# Patient Record
Sex: Female | Born: 1970 | Race: Black or African American | Hispanic: No | Marital: Single | Smoking: Current every day smoker
Health system: Southern US, Community
[De-identification: ages and names within clinical notes are randomized; demographics above are authoritative.]

## PROBLEM LIST (undated history)

## (undated) DIAGNOSIS — E119 Type 2 diabetes mellitus without complications: Secondary | ICD-10-CM

## (undated) DIAGNOSIS — I10 Essential (primary) hypertension: Secondary | ICD-10-CM

## (undated) HISTORY — PX: OTHER SURGICAL HISTORY: SHX169

## (undated) HISTORY — PX: BREAST LUMPECTOMY: SHX2

---

## 1997-11-01 ENCOUNTER — Encounter: Admission: RE | Admit: 1997-11-01 | Discharge: 1998-01-30 | Payer: Self-pay | Admitting: Obstetrics and Gynecology

## 2008-04-14 ENCOUNTER — Other Ambulatory Visit: Payer: Self-pay

## 2008-04-14 ENCOUNTER — Emergency Department: Payer: Self-pay | Admitting: Unknown Physician Specialty

## 2009-12-15 ENCOUNTER — Emergency Department: Payer: Self-pay | Admitting: Emergency Medicine

## 2019-01-21 ENCOUNTER — Telehealth: Payer: Self-pay | Admitting: Obstetrics & Gynecology

## 2019-01-21 NOTE — Telephone Encounter (Signed)
Spoke with pt. Pt has the Mirena IUD. Pt's family dr wants her to start Progesterone 200 mg. Pt wants to make sure this is ok since Mirena has progesterone also. Please advise. Thanks!! [place]

## 2019-01-21 NOTE — Telephone Encounter (Signed)
Please call pt she has the IUD and needs to talk about some meds that her primary put her on to see if it is okay, Please call pt and advise

## 2019-01-22 NOTE — Telephone Encounter (Signed)
I don't think we have ever seen this patient Am I mistaken on that?

## 2019-01-22 NOTE — Telephone Encounter (Signed)
Info on wrong pt. Encounter closed. [place]

## 2019-03-22 ENCOUNTER — Emergency Department
Admission: EM | Admit: 2019-03-22 | Discharge: 2019-03-22 | Disposition: A | Discharge status: Home / Self Care | Payer: BC Managed Care – PPO | Attending: Emergency Medicine | Admitting: Emergency Medicine

## 2019-03-22 ENCOUNTER — Other Ambulatory Visit: Payer: Self-pay

## 2019-03-22 ENCOUNTER — Encounter: Payer: Self-pay | Admitting: Emergency Medicine

## 2019-03-22 DIAGNOSIS — N390 Urinary tract infection, site not specified: Secondary | ICD-10-CM | POA: Diagnosis not present

## 2019-03-22 DIAGNOSIS — E1165 Type 2 diabetes mellitus with hyperglycemia: Secondary | ICD-10-CM | POA: Insufficient documentation

## 2019-03-22 DIAGNOSIS — F17210 Nicotine dependence, cigarettes, uncomplicated: Secondary | ICD-10-CM | POA: Diagnosis not present

## 2019-03-22 DIAGNOSIS — R739 Hyperglycemia, unspecified: Secondary | ICD-10-CM

## 2019-03-22 DIAGNOSIS — I10 Essential (primary) hypertension: Secondary | ICD-10-CM | POA: Diagnosis not present

## 2019-03-22 HISTORY — DX: Type 2 diabetes mellitus without complications: E11.9

## 2019-03-22 HISTORY — DX: Essential (primary) hypertension: I10

## 2019-03-22 LAB — CBC WITH DIFFERENTIAL/PLATELET
Abs Immature Granulocytes: 0.02 10*3/uL (ref 0.00–0.07)
Basophils Absolute: 0 10*3/uL (ref 0.0–0.1)
Basophils Relative: 0 %
Eosinophils Absolute: 0.1 10*3/uL (ref 0.0–0.5)
Eosinophils Relative: 1 %
HCT: 46 % (ref 36.0–46.0)
Hemoglobin: 14.9 g/dL (ref 12.0–15.0)
Immature Granulocytes: 0 %
Lymphocytes Relative: 34 %
Lymphs Abs: 2.7 10*3/uL (ref 0.7–4.0)
MCH: 25.6 pg — ABNORMAL LOW (ref 26.0–34.0)
MCHC: 32.4 g/dL (ref 30.0–36.0)
MCV: 79.2 fL — ABNORMAL LOW (ref 80.0–100.0)
Monocytes Absolute: 0.5 10*3/uL (ref 0.1–1.0)
Monocytes Relative: 7 %
Neutro Abs: 4.5 10*3/uL (ref 1.7–7.7)
Neutrophils Relative %: 58 %
Platelets: 235 10*3/uL (ref 150–400)
RBC: 5.81 MIL/uL — ABNORMAL HIGH (ref 3.87–5.11)
RDW: 13.2 % (ref 11.5–15.5)
WBC: 7.9 10*3/uL (ref 4.0–10.5)
nRBC: 0 % (ref 0.0–0.2)

## 2019-03-22 LAB — COMPREHENSIVE METABOLIC PANEL
ALT: 23 U/L (ref 0–44)
AST: 17 U/L (ref 15–41)
Albumin: 4.1 g/dL (ref 3.5–5.0)
Alkaline Phosphatase: 120 U/L (ref 38–126)
Anion gap: 15 (ref 5–15)
BUN: 19 mg/dL (ref 6–20)
CO2: 22 mmol/L (ref 22–32)
Calcium: 9.8 mg/dL (ref 8.9–10.3)
Chloride: 93 mmol/L — ABNORMAL LOW (ref 98–111)
Creatinine, Ser: 0.8 mg/dL (ref 0.44–1.00)
GFR calc Af Amer: >60 mL/min (ref >60)
GFR calc non Af Amer: >60 mL/min (ref >60)
Glucose, Bld: 358 mg/dL — ABNORMAL HIGH (ref 70–99)
Potassium: 4.1 mmol/L (ref 3.5–5.1)
Sodium: 130 mmol/L — ABNORMAL LOW (ref 135–145)
Total Bilirubin: 0.9 mg/dL (ref 0.3–1.2)
Total Protein: 8.5 g/dL — ABNORMAL HIGH (ref 6.5–8.1)

## 2019-03-22 LAB — GLUCOSE, CAPILLARY
Glucose-Capillary: 375 mg/dL — ABNORMAL HIGH (ref 70–99)
Glucose-Capillary: 326 mg/dL — ABNORMAL HIGH (ref 70–99)
Glucose-Capillary: 272 mg/dL — ABNORMAL HIGH (ref 70–99)

## 2019-03-22 LAB — URINALYSIS, COMPLETE (UACMP) WITH MICROSCOPIC
Bacteria, UA: NONE SEEN
Bilirubin Urine: NEGATIVE
Glucose, UA: >=500 mg/dL — AB
Ketones, ur: 20 mg/dL — AB
Nitrite: NEGATIVE
Protein, ur: NEGATIVE mg/dL
Specific Gravity, Urine: 1.033 — ABNORMAL HIGH (ref 1.005–1.030)
pH: 5 (ref 5.0–8.0)

## 2019-03-22 LAB — POCT PREGNANCY, URINE: Preg Test, Ur: NEGATIVE

## 2019-03-22 MED ORDER — INSULIN ASPART 100 UNIT/ML ~~LOC~~ SOLN
5.0000 [IU] | Freq: Once | SUBCUTANEOUS | Status: AC
  Administered 2019-03-22: 5 [IU] via INTRAVENOUS
  Filled 2019-03-22: qty 1

## 2019-03-22 MED ORDER — CEPHALEXIN 500 MG PO CAPS: 500.0000 mg | ORAL_CAPSULE | Freq: Three times a day (TID) | ORAL | 0 refills | Status: AC

## 2019-03-22 MED ORDER — PREDNISONE 20 MG PO TABS: 60.0000 mg | ORAL_TABLET | Freq: Once | ORAL | Status: DC

## 2019-03-22 MED ORDER — METFORMIN HCL ER 500 MG PO TB24: 500.0000 mg | ORAL_TABLET | Freq: Every day | ORAL | 3 refills | Status: DC

## 2019-03-22 MED ORDER — SODIUM CHLORIDE 0.9 % IV BOLUS
1000.0000 mL | Freq: Once | INTRAVENOUS | Status: AC
  Administered 2019-03-22: 1000 mL via INTRAVENOUS

## 2019-03-22 MED ORDER — FLUCONAZOLE 150 MG PO TABS: 150.0000 mg | ORAL_TABLET | Freq: Every day | ORAL | 0 refills | Status: DC

## 2019-03-22 MED ORDER — PIOGLITAZONE HCL 30 MG PO TABS: 30.0000 mg | ORAL_TABLET | Freq: Every day | ORAL | 11 refills | Status: DC

## 2019-03-22 MED ORDER — DIPHENHYDRAMINE HCL 50 MG/ML IJ SOLN: 25.0000 mg | Freq: Once | INTRAMUSCULAR | Status: DC

## 2019-03-22 NOTE — ED Provider Notes (Signed)
[facility] Emergency Department Provider Note   ____________________________________________   First MD Initiated Contact with Patient 03/22/19 1103     (approximate)  I have reviewed the triage vital signs and the nursing notes.   HISTORY  Chief Complaint Hyperglycemia    HPI Tammy Beasley is a 48 y.o. female who reports she had a UTI and since then her sugars really have not been good.  She got the UTI that was treated she got a yeast infection afterwards and got Diflucan went away came back she got more Diflucan and went away down starting come back now but her sugars have been 300 to up to 500 for over a week or more.  Patient reports she feels weak and washed out and tired all the time.  She is drinking a lot and urinating a lot as well.  She is not having any nausea vomiting diarrhea coughing fever chills or any other complaints.      Past Medical History:  Diagnosis Date  . Diabetes mellitus without complication ([place])   . Hypertension     There are no active problems to display for this patient.   History reviewed. No pertinent surgical history.  Prior to Admission medications   Medication Sig Start Date End Date Taking? Authorizing Provider  cephALEXin (KEFLEX) 500 MG capsule Take 1 capsule (500 mg total) by mouth 3 (three) times daily for 10 days. 03/22/19 04/01/19  Nena Polio, MD  fluconazole (DIFLUCAN) 150 MG tablet Take 1 tablet (150 mg total) by mouth daily. Take 1 pill now 1 in 3 days and 1 when you finish the antibiotics. 03/22/19   Nena Polio, MD  metFORMIN (GLUCOPHAGE XR) 500 MG 24 hr tablet Take 1 tablet (500 mg total) by mouth daily with breakfast. Take 1 or 2 by mouth daily before bed 03/22/19   Nena Polio, MD  pioglitazone (ACTOS) 30 MG tablet Take 1 tablet (30 mg total) by mouth daily. 03/22/19 03/21/20  Nena Polio, MD    Allergies Patient has no known allergies.  No family history on file.  Social  History Social History   Tobacco Use  . Smoking status: Current Every Day Smoker    Types: Cigarettes  . Smokeless tobacco: Never Used  Substance Use Topics  . Alcohol use: Not Currently  . Drug use: Never    Review of Systems  Constitutional: No fever/chills Eyes: No visual changes. ENT: No sore throat. Cardiovascular: Denies chest pain. Respiratory: Denies shortness of breath. Gastrointestinal: No abdominal pain.  No nausea, no vomiting.  No diarrhea.  No constipation. Genitourinary: Negative for dysuria. Musculoskeletal: Negative for back pain. Skin: Negative for rash. Neurological: Negative for headaches, focal weakness   ____________________________________________   PHYSICAL EXAM:  VITAL SIGNS: ED Triage Vitals  Enc Vitals Group     BP 03/22/19 1040 (!) 157/100     Pulse Rate 03/22/19 1040 (!) 115     Resp 03/22/19 1040 18     Temp 03/22/19 1040 98.6 F (37 C)     Temp Source 03/22/19 1040 Oral     SpO2 03/22/19 1040 96 %     Weight 03/22/19 1041 222 lb (100.7 kg)     Height 03/22/19 1041 5\' 4"  (1.626 m)     Head Circumference --      Peak Flow --      Pain Score 03/22/19 1050 0     Pain Loc --  Pain Edu? --      Excl. in [place]? --     Constitutional: Alert and oriented. Well appearing and in no acute distress. Eyes: Conjunctivae are normal.  Head: Atraumatic. Nose: No congestion/rhinnorhea. Mouth/Throat: Mucous membranes are moist.  Oropharynx non-erythematous. Neck: No stridor.   Cardiovascular: Normal rate, regular rhythm. Grossly normal heart sounds.  Good peripheral circulation. Respiratory: Normal respiratory effort.  No retractions. Lungs CTAB. Gastrointestinal: Soft and nontender. No distention. No abdominal bruits. No CVA tenderness. Musculoskeletal: No lower extremity tenderness nor edema.   Neurologic:  Normal speech and language. No gross focal neurologic deficits are appreciated. No gait instability. Skin:  Skin is warm, dry and intact.  No rash noted. Psychiatric: Mood and affect are normal. Speech and behavior are normal.  ____________________________________________   LABS (all labs ordered are listed, but only abnormal results are displayed)  Labs Reviewed  GLUCOSE, CAPILLARY - Abnormal; Notable for the following components:      Result Value   Glucose-Capillary 375 (*)    All other components within normal limits  URINALYSIS, COMPLETE (UACMP) WITH MICROSCOPIC - Abnormal; Notable for the following components:   Color, Urine YELLOW (*)    APPearance CLOUDY (*)    Specific Gravity, Urine 1.033 (*)    Glucose, UA >=500 (*)    Hgb urine dipstick MODERATE (*)    Ketones, ur 20 (*)    Leukocytes,Ua LARGE (*)    All other components within normal limits  COMPREHENSIVE METABOLIC PANEL - Abnormal; Notable for the following components:   Sodium 130 (*)    Chloride 93 (*)    Glucose, Bld 358 (*)    Total Protein 8.5 (*)    All other components within normal limits  CBC WITH DIFFERENTIAL/PLATELET - Abnormal; Notable for the following components:   RBC 5.81 (*)    MCV 79.2 (*)    MCH 25.6 (*)    All other components within normal limits  GLUCOSE, CAPILLARY - Abnormal; Notable for the following components:   Glucose-Capillary 326 (*)    All other components within normal limits  GLUCOSE, CAPILLARY - Abnormal; Notable for the following components:   Glucose-Capillary 272 (*)    All other components within normal limits  CBG MONITORING, ED  POC URINE PREG, ED  POCT PREGNANCY, URINE  CBG MONITORING, ED  CBG MONITORING, ED   ____________________________________________  EKG   ____________________________________________  RADIOLOGY  ED MD interpretation:    Official radiology report(s): No results found.  ____________________________________________   PROCEDURES  Procedure(s) performed (including Critical Care):  Procedures   ____________________________________________   INITIAL IMPRESSION /  ASSESSMENT AND PLAN / ED COURSE  Called the pharmacy.  She is taking pioglitazone 30 in the morning and 1-2 of the extended release Metformin at night.  Pharmacy had to confirm the pioglitazone form as the bottle has been worn out by her keeping it in her purse.  Patient says she always keeps it in her purse.  She has multiple refills on both prescriptions.  Pharmacy said the last time she was there was in September when she refill the metformin.  That should have been used up by now but patient admits she has not been taking it regularly until just the last 3 days.  I discussed with her the fact that since she has been caring it in her purse since September and in case of the pioglitazone since much earlier than that there is a good chance that the medicine has become ineffective due  to exposure to heat and cold.  I advised her to refill the medicines with new prescriptions take them as directed and keep a close eye on her blood sugar and a record of her blood sugars twice a day and then follow-up with her doctor to see if her medicines need to be adjusted.     Tammy Beasley was evaluated in Emergency Department on 03/22/2019 for the symptoms described in the history of present illness. She was evaluated in the context of the global COVID-19 pandemic, which necessitated consideration that the patient might be at risk for infection with the SARS-CoV-2 virus that causes COVID-19. Institutional protocols and algorithms that pertain to the evaluation of patients at risk for COVID-19 are in a state of rapid change based on information released by regulatory bodies including the CDC and federal and state organizations. These policies and algorithms were followed during the patient's care in the ED.        ____________________________________________   FINAL CLINICAL IMPRESSION(S) / ED DIAGNOSES  Final diagnoses:  Hyperglycemia  Urinary tract infection without hematuria, site unspecified     ED  Discharge Orders         Ordered    cephALEXin (KEFLEX) 500 MG capsule  3 times daily     03/22/19 1327    fluconazole (DIFLUCAN) 150 MG tablet  Daily     03/22/19 1327    pioglitazone (ACTOS) 30 MG tablet  Daily     03/22/19 1433    metFORMIN (GLUCOPHAGE XR) 500 MG 24 hr tablet  Daily with breakfast     03/22/19 1433           Note:  This document was prepared using Dragon voice recognition software and may include unintentional dictation errors.    Nena Polio, MD 03/22/19 [phone]

## 2019-03-22 NOTE — Discharge Instructions (Addendum)
Please refill your medicines tomorrow and start to take them as directed every day.  Please keep a record of your fingersticks which I would like you to take twice a day.  Take the Diflucan 1 now 1 in 3 days so that would be 1 pill Sunday or Monday 1 pill 2 days after that and 1 further pill when he finished antibiotics.  Take the Keflex as directed for UTI.  Follow-up with your doctor in the next week or so it let him adjust your medicines once we know that you have medicines that are good and working.  Return here for any very high blood sugars over 350 or any other problems especially if he cannot follow-up with your doctor.  I have refilled your metformin and your pioglitazone for you.  Please double check with your doctor to make sure he does not want to change these.

## 2019-03-22 NOTE — ED Triage Notes (Signed)
C/O raising blood sugars, more at night, x 1 month.  States noticed increasing of CBG after having a UTI one month ago and taking an antibiotic.

## 2019-03-22 NOTE — ED Notes (Signed)
Pt given PB and crackers with MD permission. Will allow pt to finish fluids and then recheck CBG after fluids finish.

## 2019-05-04 ENCOUNTER — Other Ambulatory Visit: Payer: Self-pay

## 2019-05-04 ENCOUNTER — Encounter: Payer: Self-pay | Admitting: Emergency Medicine

## 2019-05-04 ENCOUNTER — Emergency Department: Payer: BC Managed Care – PPO

## 2019-05-04 ENCOUNTER — Inpatient Hospital Stay
Admission: EM | Admit: 2019-05-04 | Discharge: 2019-05-07 | DRG: 872 | Disposition: A | Discharge status: Home / Self Care | Payer: BC Managed Care – PPO | Attending: Internal Medicine | Admitting: Internal Medicine

## 2019-05-04 DIAGNOSIS — L03115 Cellulitis of right lower limb: Secondary | ICD-10-CM | POA: Diagnosis present

## 2019-05-04 DIAGNOSIS — Z7984 Long term (current) use of oral hypoglycemic drugs: Secondary | ICD-10-CM

## 2019-05-04 DIAGNOSIS — I10 Essential (primary) hypertension: Secondary | ICD-10-CM | POA: Diagnosis present

## 2019-05-04 DIAGNOSIS — F17210 Nicotine dependence, cigarettes, uncomplicated: Secondary | ICD-10-CM | POA: Diagnosis present

## 2019-05-04 DIAGNOSIS — Z20828 Contact with and (suspected) exposure to other viral communicable diseases: Secondary | ICD-10-CM | POA: Diagnosis present

## 2019-05-04 DIAGNOSIS — Z6836 Body mass index (BMI) 36.0-36.9, adult: Secondary | ICD-10-CM

## 2019-05-04 DIAGNOSIS — A419 Sepsis, unspecified organism: Secondary | ICD-10-CM | POA: Diagnosis not present

## 2019-05-04 DIAGNOSIS — Z79899 Other long term (current) drug therapy: Secondary | ICD-10-CM

## 2019-05-04 DIAGNOSIS — E876 Hypokalemia: Secondary | ICD-10-CM | POA: Diagnosis present

## 2019-05-04 DIAGNOSIS — E785 Hyperlipidemia, unspecified: Secondary | ICD-10-CM | POA: Diagnosis present

## 2019-05-04 DIAGNOSIS — N390 Urinary tract infection, site not specified: Secondary | ICD-10-CM | POA: Diagnosis present

## 2019-05-04 DIAGNOSIS — Z833 Family history of diabetes mellitus: Secondary | ICD-10-CM

## 2019-05-04 DIAGNOSIS — E1165 Type 2 diabetes mellitus with hyperglycemia: Secondary | ICD-10-CM | POA: Diagnosis present

## 2019-05-04 LAB — PREGNANCY, URINE: Preg Test, Ur: NEGATIVE

## 2019-05-04 LAB — COMPREHENSIVE METABOLIC PANEL
ALT: 23 U/L (ref 0–44)
AST: 24 U/L (ref 15–41)
Albumin: 3.9 g/dL (ref 3.5–5.0)
Alkaline Phosphatase: 110 U/L (ref 38–126)
Anion gap: 12 (ref 5–15)
BUN: 10 mg/dL (ref 6–20)
CO2: 23 mmol/L (ref 22–32)
Calcium: 9.2 mg/dL (ref 8.9–10.3)
Chloride: 97 mmol/L — ABNORMAL LOW (ref 98–111)
Creatinine, Ser: 0.75 mg/dL (ref 0.44–1.00)
GFR calc Af Amer: >60 mL/min (ref >60)
GFR calc non Af Amer: >60 mL/min (ref >60)
Glucose, Bld: 311 mg/dL — ABNORMAL HIGH (ref 70–99)
Potassium: 3.4 mmol/L — ABNORMAL LOW (ref 3.5–5.1)
Sodium: 132 mmol/L — ABNORMAL LOW (ref 135–145)
Total Bilirubin: 1.8 mg/dL — ABNORMAL HIGH (ref 0.3–1.2)
Total Protein: 8.2 g/dL — ABNORMAL HIGH (ref 6.5–8.1)

## 2019-05-04 LAB — URINALYSIS, COMPLETE (UACMP) WITH MICROSCOPIC
Glucose, UA: >=500 mg/dL — AB
Ketones, ur: NEGATIVE mg/dL
Nitrite: NEGATIVE
Protein, ur: 100 mg/dL — AB
Specific Gravity, Urine: 1.03 (ref 1.005–1.030)
Squamous Epithelial / HPF: >50 — ABNORMAL HIGH (ref 0–5)
pH: 5 (ref 5.0–8.0)

## 2019-05-04 LAB — CBC WITH DIFFERENTIAL/PLATELET
Abs Immature Granulocytes: 0.19 10*3/uL — ABNORMAL HIGH (ref 0.00–0.07)
Basophils Absolute: 0.1 10*3/uL (ref 0.0–0.1)
Basophils Relative: 0 %
Eosinophils Absolute: 0.1 10*3/uL (ref 0.0–0.5)
Eosinophils Relative: 0 %
HCT: 45.3 % (ref 36.0–46.0)
Hemoglobin: 14.7 g/dL (ref 12.0–15.0)
Immature Granulocytes: 1 %
Lymphocytes Relative: 4 %
Lymphs Abs: 0.7 10*3/uL (ref 0.7–4.0)
MCH: 26.2 pg (ref 26.0–34.0)
MCHC: 32.5 g/dL (ref 30.0–36.0)
MCV: 80.6 fL (ref 80.0–100.0)
Monocytes Absolute: 0.4 10*3/uL (ref 0.1–1.0)
Monocytes Relative: 2 %
Neutro Abs: 16.8 10*3/uL — ABNORMAL HIGH (ref 1.7–7.7)
Neutrophils Relative %: 93 %
Platelets: 255 10*3/uL (ref 150–400)
RBC: 5.62 MIL/uL — ABNORMAL HIGH (ref 3.87–5.11)
RDW: 13.4 % (ref 11.5–15.5)
WBC: 18.2 10*3/uL — ABNORMAL HIGH (ref 4.0–10.5)
nRBC: 0 % (ref 0.0–0.2)

## 2019-05-04 LAB — PROTIME-INR
INR: 1 (ref 0.8–1.2)
Prothrombin Time: 13.4 seconds (ref 11.4–15.2)

## 2019-05-04 LAB — GLUCOSE, CAPILLARY: Glucose-Capillary: 299 mg/dL — ABNORMAL HIGH (ref 70–99)

## 2019-05-04 LAB — APTT: aPTT: 30 seconds (ref 24–36)

## 2019-05-04 LAB — LACTIC ACID, PLASMA: Lactic Acid, Venous: 1.5 mmol/L (ref 0.5–1.9)

## 2019-05-04 MED ORDER — SODIUM CHLORIDE 0.9% FLUSH: 3.0000 mL | Freq: Once | INTRAVENOUS | Status: DC

## 2019-05-04 MED ORDER — SODIUM CHLORIDE 0.9 % IV BOLUS
1000.0000 mL | Freq: Once | INTRAVENOUS | Status: AC
  Administered 2019-05-05: 01:00:00 1000 mL via INTRAVENOUS

## 2019-05-04 MED ORDER — ACETAMINOPHEN 500 MG PO TABS
1000.0000 mg | ORAL_TABLET | Freq: Once | ORAL | Status: AC
  Administered 2019-05-05: 1000 mg via ORAL
  Filled 2019-05-04: qty 2

## 2019-05-04 NOTE — ED Notes (Signed)
Pt to the er for lower right leg swelling that she thinks is cellulitis. Pt has a hx of cellulitis.hyperglycemia and weakness. Pt states she has been having a UTI and her sugar has not been doing well. Pt says she has been avoiding starting insulin but thinks she may need to. Pt has good pedal pulses. Swelling present to the right lower leg. No pitting but tightness noted.

## 2019-05-04 NOTE — ED Provider Notes (Signed)
[facility] Emergency Department Provider Note  ____________________________________________  Time seen: Approximately 11:58 PM  I have reviewed the triage vital signs and the nursing notes.   HISTORY  Chief Complaint Hyperglycemia   HPI Tammy Beasley is a 48 y.o. female with a history of diabetes and hypertension who presents for evaluation of malaise and hyperglycemia.  Patient reports that she woke up in her usual state of health but throughout the day started feeling sick.  Patient noticed that over the last few days her sugars have been getting higher so she thought this was the cause of her not feeling well.  She also noticed some pain in her right lower extremity for the last few days.  The pain is mostly present with weightbearing, throbbing, mild in intensity.  She did not notice a fever at home.  She had some nausea but no vomiting.  She has no chest pain or shortness of breath.  She has had prior cellulitis in that leg.  She denies ever having a PE or DVT, no family history of such, no chest pain or shortness of breath, no exogenous hormones, no hemoptysis, no recent travel or immobilization.  She denies cough, dysuria, vomiting, diarrhea.  She denies any known exposures to COVID-19   Past Medical History:  Diagnosis Date  . Diabetes mellitus without complication ([place])   . Hypertension     Prior to Admission medications   Medication Sig Start Date End Date Taking? Authorizing Provider  lisinopril (ZESTRIL) 10 MG tablet Take 10 mg by mouth daily.   Yes [provider]  metFORMIN (GLUCOPHAGE XR) 500 MG 24 hr tablet Take 1 tablet (500 mg total) by mouth daily with breakfast. Take 1 or 2 by mouth daily before bed Patient taking differently: Take 500-1,000 mg by mouth at bedtime.  03/22/19  Yes Nena Polio, MD  pioglitazone (ACTOS) 30 MG tablet Take 1 tablet (30 mg total) by mouth daily. 03/22/19 03/21/20 Yes Nena Polio, MD   fluconazole (DIFLUCAN) 150 MG tablet Take 1 tablet (150 mg total) by mouth daily. Take 1 pill now 1 in 3 days and 1 when you finish the antibiotics. Patient not taking: Reported on 05/05/2019 03/22/19   Nena Polio, MD    Allergies Patient has no known allergies.  No family history on file.  Social History Social History   Tobacco Use  . Smoking status: Current Every Day Smoker    Types: Cigarettes  . Smokeless tobacco: Never Used  Substance Use Topics  . Alcohol use: Not Currently  . Drug use: Never    Review of Systems  Constitutional: + fever  Eyes: Negative for visual changes. ENT: Negative for sore throat. Neck: No neck pain  Cardiovascular: Negative for chest pain. Respiratory: Negative for shortness of breath. Gastrointestinal: Negative for abdominal pain, vomiting or diarrhea. Genitourinary: Negative for dysuria. Musculoskeletal: Negative for back pain. + RLE pain and swelling Skin: Negative for rash. Neurological: Negative for headaches, weakness or numbness. Psych: No SI or HI  ____________________________________________   PHYSICAL EXAM:  VITAL SIGNS: ED Triage Vitals  Enc Vitals Group     BP 05/04/19 1827 118/76     Pulse Rate 05/04/19 1827 (!) 128     Resp 05/04/19 1827 16     Temp 05/04/19 1827 (!) 102.1 F (38.9 C)     Temp Source 05/04/19 1827 Oral     SpO2 05/04/19 1827 96 %     Weight 05/04/19 1828  220 lb (99.8 kg)     Height 05/04/19 1828 5\' 5"  (1.651 m)     Head Circumference --      Peak Flow --      Pain Score 05/04/19 1900 0     Pain Loc --      Pain Edu? --      Excl. in [place]? --     Constitutional: Alert and oriented. Well appearing and in no apparent distress. HEENT:      Head: Normocephalic and atraumatic.         Eyes: Conjunctivae are normal. Sclera is non-icteric.       Mouth/Throat: Mucous membranes are moist.       Neck: Supple with no signs of meningismus. Cardiovascular: Tachycardic with regular rhythm. No murmurs,  gallops, or rubs. 2+ symmetrical distal pulses are present in all extremities. No JVD. Respiratory: Normal respiratory effort. Lungs are clear to auscultation bilaterally. No wheezes, crackles, or rhonchi.  Gastrointestinal: Soft, non tender, and non distended with positive bowel sounds. No rebound or guarding. Musculoskeletal: Asymmetric swelling of the RLE with erythema and warmth, strong distal pulses. Neurologic: Normal speech and language. Face is symmetric. Moving all extremities. No gross focal neurologic deficits are appreciated. Skin: Skin is warm, dry and intact. No rash noted. Psychiatric: Mood and affect are normal. Speech and behavior are normal.  ____________________________________________   LABS (all labs ordered are listed, but only abnormal results are displayed)  Labs Reviewed  GLUCOSE, CAPILLARY - Abnormal; Notable for the following components:      Result Value   Glucose-Capillary 299 (*)    All other components within normal limits  COMPREHENSIVE METABOLIC PANEL - Abnormal; Notable for the following components:   Sodium 132 (*)    Potassium 3.4 (*)    Chloride 97 (*)    Glucose, Bld 311 (*)    Total Protein 8.2 (*)    Total Bilirubin 1.8 (*)    All other components within normal limits  CBC WITH DIFFERENTIAL/PLATELET - Abnormal; Notable for the following components:   WBC 18.2 (*)    RBC 5.62 (*)    Neutro Abs 16.8 (*)    Abs Immature Granulocytes 0.19 (*)    All other components within normal limits  URINALYSIS, COMPLETE (UACMP) WITH MICROSCOPIC - Abnormal; Notable for the following components:   Color, Urine AMBER (*)    APPearance TURBID (*)    Glucose, UA >=500 (*)    Hgb urine dipstick SMALL (*)    Bilirubin Urine SMALL (*)    Protein, ur 100 (*)    Leukocytes,Ua TRACE (*)    Bacteria, UA MANY (*)    Squamous Epithelial / LPF >50 (*)    All other components within normal limits  URINE CULTURE  CULTURE, BLOOD (ROUTINE X 2)  CULTURE, BLOOD  (ROUTINE X 2)  SARS CORONAVIRUS 2  LACTIC ACID, PLASMA  APTT  PROTIME-INR  PREGNANCY, URINE   ____________________________________________  EKG  ED ECG REPORT I, Rudene Re, the attending physician, personally viewed and interpreted this ECG.  Sinus rhythm, rate of 92, normal intervals, normal axis, no ST elevations or depressions.  Normal EKG. ____________________________________________  RADIOLOGY  I have personally reviewed the images performed during this visit and I agree with the Radiologist's read.   Interpretation by Radiologist:  Dg Chest 2 View  Result Date: 05/04/2019 CLINICAL DATA:  Hyperglycemia EXAM: CHEST - 2 VIEW COMPARISON:  04/14/2008 report FINDINGS: Low lung volumes. No consolidation or effusion. Normal heart  size. No pneumothorax. IMPRESSION: No active cardiopulmonary disease. Electronically Signed   By: Donavan Foil M.D.   On: 05/04/2019 20:31   US Venous Img Lower Unilateral Right  Result Date: 05/05/2019 CLINICAL DATA:  Right lower extremity swelling EXAM: RIGHT LOWER EXTREMITY VENOUS DOPPLER ULTRASOUND TECHNIQUE: Gray-scale sonography with graded compression, as well as color Doppler and duplex ultrasound were performed to evaluate the lower extremity deep venous systems from the level of the common femoral vein and including the common femoral, femoral, profunda femoral, popliteal and calf veins including the posterior tibial, peroneal and gastrocnemius veins when visible. The superficial great saphenous vein was also interrogated. Spectral Doppler was utilized to evaluate flow at rest and with distal augmentation maneuvers in the common femoral, femoral and popliteal veins. COMPARISON:  None. FINDINGS: Contralateral Common Femoral Vein: Respiratory phasicity is normal and symmetric with the symptomatic side. No evidence of thrombus. Normal compressibility. Common Femoral Vein: No evidence of thrombus. Normal compressibility, respiratory phasicity and  response to augmentation. Saphenofemoral Junction: No evidence of thrombus. Normal compressibility and flow on color Doppler imaging. Profunda Femoral Vein: No evidence of thrombus. Normal compressibility and flow on color Doppler imaging. Femoral Vein: No evidence of thrombus. Normal compressibility, respiratory phasicity and response to augmentation. Popliteal Vein: No evidence of thrombus. Normal compressibility, respiratory phasicity and response to augmentation. Calf Veins: No evidence of thrombus. Normal compressibility and flow on color Doppler imaging. Superficial Great Saphenous Vein: No evidence of thrombus. Normal compressibility. Venous Reflux:  None. Other Findings:  None. IMPRESSION: No evidence of deep venous thrombosis. Electronically Signed   By: Rolm Baptise M.D.   On: 05/05/2019 00:28    ____________________________________________   PROCEDURES  Procedure(s) performed: None Procedures Critical Care performed: yes  CRITICAL CARE Performed by: Rudene Re  ?  Total critical care time: 35 min  Critical care time was exclusive of separately billable procedures and treating other patients.  Critical care was necessary to treat or prevent imminent or life-threatening deterioration.  Critical care was time spent personally by me on the following activities: development of treatment plan with patient and/or surrogate as well as nursing, discussions with consultants, evaluation of patient's response to treatment, examination of patient, obtaining history from patient or surrogate, ordering and performing treatments and interventions, ordering and review of laboratory studies, ordering and review of radiographic studies, pulse oximetry and re-evaluation of patient's condition.  ____________________________________________   INITIAL IMPRESSION / ASSESSMENT AND PLAN / ED COURSE  48 y.o. female with a history of diabetes and hypertension who presents for evaluation of  malaise, hyperglycemia, fever, and RLE swelling. Ddx cellulitis vs DVT. Doppler pending.  Patient is febrile with a temperature of 102.76F, tachycardic and with elevated WBC of 18.2. No AG and mild hyperglycemia with no signs of DKA. Lactic negative. Will start patient on tylenol and IVF and wait for results of doppler.     _________________________ 12:46 AM on 05/05/2019 -----------------------------------------  Venous Doppler study negative for DVT.  Presentation consistent with sepsis from cellulitis.  Patient will be given Rocephin and Vanco will be admitted to the hospitalist service.    As part of my medical decision making, I reviewed the following data within the [place] notes reviewed and incorporated, Labs reviewed , EKG interpreted , Old chart reviewed, Radiograph reviewed , Discussed with admitting physician , Notes from prior ED visits and [place] Controlled Substance Database   Patient was evaluated in Emergency Department today for the symptoms described in the history  of present illness. Patient was evaluated in the context of the global COVID-19 pandemic, which necessitated consideration that the patient might be at risk for infection with the SARS-CoV-2 virus that causes COVID-19. Institutional protocols and algorithms that pertain to the evaluation of patients at risk for COVID-19 are in a state of rapid change based on information released by regulatory bodies including the CDC and federal and state organizations. These policies and algorithms were followed during the patient's care in the ED.   ____________________________________________   FINAL CLINICAL IMPRESSION(S) / ED DIAGNOSES   Final diagnoses:  Sepsis without acute organ dysfunction, due to unspecified organism ([place])  Cellulitis of right lower extremity      NEW MEDICATIONS STARTED DURING THIS VISIT:  ED Discharge Orders    None       Note:  This document was prepared using  Dragon voice recognition software and may include unintentional dictation errors.    Alfred Levins, [place], MD 05/05/19 [phone]

## 2019-05-04 NOTE — ED Notes (Signed)
Rainbow plus grey top sent to lab.  Also 1 set blood cultures sent

## 2019-05-04 NOTE — ED Triage Notes (Signed)
States woke up this morning, c/o headache.  Then noticed right leg soreness.  Also noticed top of right leg red, and elevated blood sugar has been elevated.  States this morning, after eating, CBG 399.  States takes Pioglitazone 30 mg QD, and Metformin 500 mg PO BID

## 2019-05-05 ENCOUNTER — Other Ambulatory Visit: Payer: Self-pay

## 2019-05-05 DIAGNOSIS — I10 Essential (primary) hypertension: Secondary | ICD-10-CM | POA: Diagnosis present

## 2019-05-05 DIAGNOSIS — Z79899 Other long term (current) drug therapy: Secondary | ICD-10-CM | POA: Diagnosis not present

## 2019-05-05 DIAGNOSIS — Z6836 Body mass index (BMI) 36.0-36.9, adult: Secondary | ICD-10-CM | POA: Diagnosis not present

## 2019-05-05 DIAGNOSIS — A419 Sepsis, unspecified organism: Secondary | ICD-10-CM | POA: Diagnosis present

## 2019-05-05 DIAGNOSIS — Z833 Family history of diabetes mellitus: Secondary | ICD-10-CM | POA: Diagnosis not present

## 2019-05-05 DIAGNOSIS — E876 Hypokalemia: Secondary | ICD-10-CM | POA: Diagnosis present

## 2019-05-05 DIAGNOSIS — F17210 Nicotine dependence, cigarettes, uncomplicated: Secondary | ICD-10-CM | POA: Diagnosis present

## 2019-05-05 DIAGNOSIS — E1165 Type 2 diabetes mellitus with hyperglycemia: Secondary | ICD-10-CM | POA: Diagnosis present

## 2019-05-05 DIAGNOSIS — Z7984 Long term (current) use of oral hypoglycemic drugs: Secondary | ICD-10-CM | POA: Diagnosis not present

## 2019-05-05 DIAGNOSIS — E785 Hyperlipidemia, unspecified: Secondary | ICD-10-CM | POA: Diagnosis present

## 2019-05-05 DIAGNOSIS — N390 Urinary tract infection, site not specified: Secondary | ICD-10-CM | POA: Diagnosis present

## 2019-05-05 DIAGNOSIS — L03115 Cellulitis of right lower limb: Secondary | ICD-10-CM | POA: Diagnosis present

## 2019-05-05 DIAGNOSIS — Z20828 Contact with and (suspected) exposure to other viral communicable diseases: Secondary | ICD-10-CM | POA: Diagnosis present

## 2019-05-05 LAB — BASIC METABOLIC PANEL
Anion gap: 7 (ref 5–15)
BUN: 13 mg/dL (ref 6–20)
CO2: 26 mmol/L (ref 22–32)
Calcium: 8 mg/dL — ABNORMAL LOW (ref 8.9–10.3)
Chloride: 103 mmol/L (ref 98–111)
Creatinine, Ser: 1.01 mg/dL — ABNORMAL HIGH (ref 0.44–1.00)
GFR calc Af Amer: >60 mL/min (ref >60)
GFR calc non Af Amer: >60 mL/min (ref >60)
Glucose, Bld: 261 mg/dL — ABNORMAL HIGH (ref 70–99)
Potassium: 3.5 mmol/L (ref 3.5–5.1)
Sodium: 136 mmol/L (ref 135–145)

## 2019-05-05 LAB — SARS CORONAVIRUS 2 (TAT 6-24 HRS): SARS Coronavirus 2: NEGATIVE

## 2019-05-05 LAB — HEMOGLOBIN A1C
Hgb A1c MFr Bld: 12.7 % — ABNORMAL HIGH (ref 4.8–5.6)
Mean Plasma Glucose: 317.79 mg/dL

## 2019-05-05 LAB — GLUCOSE, CAPILLARY
Glucose-Capillary: 283 mg/dL — ABNORMAL HIGH (ref 70–99)
Glucose-Capillary: 196 mg/dL — ABNORMAL HIGH (ref 70–99)
Glucose-Capillary: 155 mg/dL — ABNORMAL HIGH (ref 70–99)
Glucose-Capillary: 216 mg/dL — ABNORMAL HIGH (ref 70–99)

## 2019-05-05 LAB — CBC
HCT: 39.9 % (ref 36.0–46.0)
Hemoglobin: 12.5 g/dL (ref 12.0–15.0)
MCH: 25.7 pg — ABNORMAL LOW (ref 26.0–34.0)
MCHC: 31.3 g/dL (ref 30.0–36.0)
MCV: 82.1 fL (ref 80.0–100.0)
Platelets: 206 10*3/uL (ref 150–400)
RBC: 4.86 MIL/uL (ref 3.87–5.11)
RDW: 13.7 % (ref 11.5–15.5)
WBC: 14.9 10*3/uL — ABNORMAL HIGH (ref 4.0–10.5)
nRBC: 0 % (ref 0.0–0.2)

## 2019-05-05 MED ORDER — VANCOMYCIN HCL 1000 MG IV SOLR: 750.0000 mg | Freq: Two times a day (BID) | INTRAVENOUS | Status: DC

## 2019-05-05 MED ORDER — INSULIN STARTER KIT- PEN NEEDLES (ENGLISH)
1.0000 | Freq: Once | Status: AC
  Administered 2019-05-05: 1
  Filled 2019-05-05 (×2): qty 1

## 2019-05-05 MED ORDER — ACETAMINOPHEN 650 MG RE SUPP
650.0000 mg | Freq: Four times a day (QID) | RECTAL | Status: DC | PRN
  Filled 2019-05-05: qty 1

## 2019-05-05 MED ORDER — ONDANSETRON HCL 4 MG PO TABS
4.0000 mg | ORAL_TABLET | Freq: Four times a day (QID) | ORAL | Status: DC | PRN
  Administered 2019-05-05: 23:00:00 4 mg via ORAL
  Filled 2019-05-05 (×2): qty 1

## 2019-05-05 MED ORDER — ONDANSETRON HCL 4 MG/2ML IJ SOLN
4.0000 mg | Freq: Four times a day (QID) | INTRAMUSCULAR | Status: DC | PRN
  Administered 2019-05-07: 4 mg via INTRAVENOUS
  Filled 2019-05-05: qty 2

## 2019-05-05 MED ORDER — VANCOMYCIN HCL IN DEXTROSE 1-5 GM/200ML-% IV SOLN: 1000.0000 mg | Freq: Once | INTRAVENOUS | Status: DC

## 2019-05-05 MED ORDER — PIOGLITAZONE HCL 30 MG PO TABS
30.0000 mg | ORAL_TABLET | Freq: Every day | ORAL | Status: DC
  Administered 2019-05-05 – 2019-05-07 (×3): 30 mg via ORAL
  Filled 2019-05-05 (×3): qty 1

## 2019-05-05 MED ORDER — SODIUM CHLORIDE 0.9 % IV SOLN
2.0000 g | INTRAVENOUS | Status: DC
  Administered 2019-05-05: 2 g via INTRAVENOUS
  Filled 2019-05-05: qty 20

## 2019-05-05 MED ORDER — SODIUM CHLORIDE 0.9 % IV BOLUS
1000.0000 mL | Freq: Once | INTRAVENOUS | Status: AC
  Administered 2019-05-05: 01:00:00 1000 mL via INTRAVENOUS

## 2019-05-05 MED ORDER — ENOXAPARIN SODIUM 40 MG/0.4ML ~~LOC~~ SOLN
40.0000 mg | SUBCUTANEOUS | Status: DC
  Administered 2019-05-05 – 2019-05-06 (×2): 40 mg via SUBCUTANEOUS
  Filled 2019-05-05 (×3): qty 0.4

## 2019-05-05 MED ORDER — LIVING WELL WITH DIABETES BOOK
Freq: Once | Status: AC
  Administered 2019-05-05: 17:00:00
  Filled 2019-05-05: qty 1

## 2019-05-05 MED ORDER — TRAZODONE HCL 50 MG PO TABS
25.0000 mg | ORAL_TABLET | Freq: Every evening | ORAL | Status: DC | PRN
  Filled 2019-05-05: qty 0.5

## 2019-05-05 MED ORDER — VANCOMYCIN HCL IN DEXTROSE 750-5 MG/150ML-% IV SOLN
750.0000 mg | Freq: Two times a day (BID) | INTRAVENOUS | Status: DC
  Administered 2019-05-05 – 2019-05-07 (×4): 750 mg via INTRAVENOUS
  Filled 2019-05-05 (×6): qty 150

## 2019-05-05 MED ORDER — OXYCODONE HCL 5 MG PO TABS: 5.0000 mg | ORAL_TABLET | ORAL | Status: DC | PRN

## 2019-05-05 MED ORDER — LISINOPRIL 10 MG PO TABS
10.0000 mg | ORAL_TABLET | Freq: Every day | ORAL | Status: DC
  Administered 2019-05-06 – 2019-05-07 (×2): 10 mg via ORAL
  Filled 2019-05-05 (×3): qty 1

## 2019-05-05 MED ORDER — KETOROLAC TROMETHAMINE 30 MG/ML IJ SOLN: 15.0000 mg | Freq: Four times a day (QID) | INTRAMUSCULAR | Status: DC | PRN

## 2019-05-05 MED ORDER — VANCOMYCIN HCL IN DEXTROSE 1-5 GM/200ML-% IV SOLN
1000.0000 mg | Freq: Once | INTRAVENOUS | Status: AC
  Administered 2019-05-05: 1000 mg via INTRAVENOUS
  Filled 2019-05-05: qty 200

## 2019-05-05 MED ORDER — ACETAMINOPHEN 325 MG PO TABS
650.0000 mg | ORAL_TABLET | Freq: Four times a day (QID) | ORAL | Status: DC | PRN
  Administered 2019-05-05: 17:00:00 650 mg via ORAL
  Filled 2019-05-05: qty 2

## 2019-05-05 MED ORDER — SODIUM CHLORIDE 0.9 % IV SOLN
INTRAVENOUS | Status: DC
  Administered 2019-05-05 – 2019-05-07 (×4): via INTRAVENOUS

## 2019-05-05 MED ORDER — INSULIN GLARGINE 100 UNIT/ML ~~LOC~~ SOLN
10.0000 [IU] | Freq: Every day | SUBCUTANEOUS | Status: DC
  Administered 2019-05-05 – 2019-05-06 (×2): 10 [IU] via SUBCUTANEOUS
  Filled 2019-05-05 (×3): qty 0.1

## 2019-05-05 MED ORDER — PIPERACILLIN-TAZOBACTAM 3.375 G IVPB
3.3750 g | Freq: Three times a day (TID) | INTRAVENOUS | Status: DC
  Administered 2019-05-05 – 2019-05-07 (×6): 3.375 g via INTRAVENOUS
  Filled 2019-05-05 (×8): qty 50

## 2019-05-05 MED ORDER — INSULIN ASPART 100 UNIT/ML ~~LOC~~ SOLN
0.0000 [IU] | Freq: Three times a day (TID) | SUBCUTANEOUS | Status: DC
  Administered 2019-05-05: 4 [IU] via SUBCUTANEOUS
  Administered 2019-05-05: 11 [IU] via SUBCUTANEOUS
  Administered 2019-05-05 – 2019-05-06 (×4): 4 [IU] via SUBCUTANEOUS
  Administered 2019-05-07: 7 [IU] via SUBCUTANEOUS
  Administered 2019-05-07: 3 [IU] via SUBCUTANEOUS
  Filled 2019-05-05 (×8): qty 1

## 2019-05-05 MED ORDER — PIPERACILLIN-TAZOBACTAM 3.375 G IVPB 30 MIN
3.3750 g | Freq: Once | INTRAVENOUS | Status: AC
  Administered 2019-05-05: 3.375 g via INTRAVENOUS
  Filled 2019-05-05: qty 50

## 2019-05-05 MED ORDER — MAGNESIUM HYDROXIDE 400 MG/5ML PO SUSP
30.0000 mL | Freq: Every day | ORAL | Status: DC | PRN
  Filled 2019-05-05: qty 30

## 2019-05-05 NOTE — ED Notes (Signed)
ED TO INPATIENT HANDOFF REPORT  ED Nurse Name and Phone #: Ignatius Specking [phone]  S Name/Age/Gender Tammy Beasley 48 y.o. female Room/Bed: ED33A/ED33A  Code Status   Code Status: Full Code  Home/SNF/Other Home Patient oriented to: self, place, time and situation Is this baseline? Yes   Triage Complete: Triage complete  Chief Complaint hyperglycemia   Triage Note States woke up this morning, c/o headache.  Then noticed right leg soreness.  Also noticed top of right leg red, and elevated blood sugar has been elevated.  States this morning, after eating, CBG 399.  States takes Pioglitazone 30 mg QD, and Metformin 500 mg PO BID   Allergies No Known Allergies  Level of Care/Admitting Diagnosis ED Disposition    ED Disposition Condition [facility]: [place] [[id number]]  Level of Care: Med-Surg [16]  Covid Evaluation: Asymptomatic Screening Protocol (No Symptoms)  Diagnosis: Sepsis [facility]) [[id number]]  Admitting Physician: Christel Mormon [[id number]]  Attending Physician: Christel Mormon [[id number]]  Estimated length of stay: past midnight tomorrow  Certification:: I certify this patient will need inpatient services for at least 2 midnights  PT Class (Do Not Modify): Inpatient [101]  PT Acc Code (Do Not Modify): Private [1]       B Medical/Surgery History Past Medical History:  Diagnosis Date  . Diabetes mellitus without complication ([place])   . Hypertension    History reviewed. No pertinent surgical history.   A IV Location/Drains/Wounds Patient Lines/Drains/Airways Status   Active Line/Drains/Airways    Name:   Placement date:   Placement time:   Site:   Days:   Peripheral IV 05/05/19 Left Antecubital   05/05/19    0032    Antecubital   less than 1   Peripheral IV 05/05/19 Right Antecubital   05/05/19    0030    Antecubital   less than 1          Intake/Output Last 24 hours  Intake/Output Summary (Last 24 hours) at 05/05/2019  1416 Last data filed at 05/05/2019 2683 Gross per 24 hour  Intake 200 ml  Output no documentation  Net 200 ml    Labs/Imaging Results for orders placed or performed during the hospital encounter of 05/04/19 (from the past 48 hour(s))  Glucose, capillary     Status: Abnormal   Collection Time: 05/04/19  6:31 PM  Result Value Ref Range   Glucose-Capillary 299 (H) 70 - 99 mg/dL   Comment 1 Notify RN    Comment 2 Document in Chart   Lactic acid, plasma     Status: None   Collection Time: 05/04/19  7:04 PM  Result Value Ref Range   Lactic Acid, Venous 1.5 0.5 - 1.9 mmol/L    Comment: Performed at [facility], [address] [place], [place] [address]  Comprehensive metabolic panel     Status: Abnormal   Collection Time: 05/04/19  7:04 PM  Result Value Ref Range   Sodium 132 (L) 135 - 145 mmol/L   Potassium 3.4 (L) 3.5 - 5.1 mmol/L   Chloride 97 (L) 98 - 111 mmol/L   CO2 23 22 - 32 mmol/L   Glucose, Bld 311 (H) 70 - 99 mg/dL   BUN 10 6 - 20 mg/dL   Creatinine, Ser 0.75 0.44 - 1.00 mg/dL   Calcium 9.2 8.9 - 10.3 mg/dL   Total Protein 8.2 (H) 6.5 - 8.1 g/dL   Albumin 3.9 3.5 - 5.0 g/dL  AST 24 15 - 41 U/L   ALT 23 0 - 44 U/L   Alkaline Phosphatase 110 38 - 126 U/L   Total Bilirubin 1.8 (H) 0.3 - 1.2 mg/dL   GFR calc non Af Amer >60 >60 mL/min   GFR calc Af Amer >60 >60 mL/min   Anion gap 12 5 - 15    Comment: Performed at [facility], [address] [place], [place] [address]  CBC with Differential     Status: Abnormal   Collection Time: 05/04/19  7:04 PM  Result Value Ref Range   WBC 18.2 (H) 4.0 - 10.5 K/uL   RBC 5.62 (H) 3.87 - 5.11 MIL/uL   Hemoglobin 14.7 12.0 - 15.0 g/dL   HCT 45.3 36.0 - 46.0 %   MCV 80.6 80.0 - 100.0 fL   MCH 26.2 26.0 - 34.0 pg   MCHC 32.5 30.0 - 36.0 g/dL   RDW 13.4 11.5 - 15.5 %   Platelets 255 150 - 400 K/uL   nRBC 0.0 0.0 - 0.2 %   Neutrophils Relative % 93 %   Neutro Abs 16.8 (H) 1.7 - 7.7 K/uL   Lymphocytes  Relative 4 %   Lymphs Abs 0.7 0.7 - 4.0 K/uL   Monocytes Relative 2 %   Monocytes Absolute 0.4 0.1 - 1.0 K/uL   Eosinophils Relative 0 %   Eosinophils Absolute 0.1 0.0 - 0.5 K/uL   Basophils Relative 0 %   Basophils Absolute 0.1 0.0 - 0.1 K/uL   Immature Granulocytes 1 %   Abs Immature Granulocytes 0.19 (H) 0.00 - 0.07 K/uL    Comment: Performed at [facility], [address] [place], [place] [address]  APTT     Status: None   Collection Time: 05/04/19  7:04 PM  Result Value Ref Range   aPTT 30 24 - 36 seconds    Comment: Performed at [facility], [address] [place], [place] [address]  Protime-INR     Status: None   Collection Time: 05/04/19  7:04 PM  Result Value Ref Range   Prothrombin Time 13.4 11.4 - 15.2 seconds   INR 1.0 0.8 - 1.2    Comment: (NOTE) INR goal varies based on device and disease states. Performed at [facility], [address] [place], [place] [address]   Urinalysis, Complete w Microscopic     Status: Abnormal   Collection Time: 05/04/19 10:36 PM  Result Value Ref Range   Color, Urine AMBER (A) YELLOW    Comment: BIOCHEMICALS MAY BE AFFECTED BY COLOR   APPearance TURBID (A) CLEAR   Specific Gravity, Urine 1.030 1.005 - 1.030   pH 5.0 5.0 - 8.0   Glucose, UA >=500 (A) NEGATIVE mg/dL   Hgb urine dipstick SMALL (A) NEGATIVE   Bilirubin Urine SMALL (A) NEGATIVE   Ketones, ur NEGATIVE NEGATIVE mg/dL   Protein, ur 100 (A) NEGATIVE mg/dL   Nitrite NEGATIVE NEGATIVE   Leukocytes,Ua TRACE (A) NEGATIVE   RBC / HPF 11-20 0 - 5 RBC/hpf   WBC, UA 21-50 0 - 5 WBC/hpf   Bacteria, UA MANY (A) NONE SEEN   Squamous Epithelial / LPF >50 (H) 0 - 5   Mucus PRESENT    Hyaline Casts, UA PRESENT     Comment: Performed at [facility], [address], [place], [place] [address]  Pregnancy, urine     Status: None   Collection Time: 05/04/19 10:36 PM  Result Value Ref Range  Preg Test, Ur NEGATIVE NEGATIVE    Comment:  Performed at [facility], [address] [place], [place] [address]  Blood Culture (routine x 2)     Status: None (Preliminary result)   Collection Time: 05/05/19  1:06 AM   Specimen: BLOOD  Result Value Ref Range   Specimen Description BLOOD LEFT ASSIST CONTROL    Special Requests      BOTTLES DRAWN AEROBIC AND ANAEROBIC Blood Culture adequate volume   Culture      NO GROWTH < 12 HOURS Performed at [facility], [address] [place], [place] [address]    Report Status PENDING   Blood Culture (routine x 2)     Status: None (Preliminary result)   Collection Time: 05/05/19  1:06 AM   Specimen: BLOOD  Result Value Ref Range   Specimen Description BLOOD RIGHT ASSIST CONTROL    Special Requests      BOTTLES DRAWN AEROBIC AND ANAEROBIC Blood Culture results may not be optimal due to an excessive volume of blood received in culture bottles   Culture      NO GROWTH < 12 HOURS Performed at [facility], [address] [place], [place] [address]    Report Status PENDING   CBC     Status: Abnormal   Collection Time: 05/05/19  5:28 AM  Result Value Ref Range   WBC 14.9 (H) 4.0 - 10.5 K/uL   RBC 4.86 3.87 - 5.11 MIL/uL   Hemoglobin 12.5 12.0 - 15.0 g/dL   HCT 39.9 36.0 - 46.0 %   MCV 82.1 80.0 - 100.0 fL   MCH 25.7 (L) 26.0 - 34.0 pg   MCHC 31.3 30.0 - 36.0 g/dL   RDW 13.7 11.5 - 15.5 %   Platelets 206 150 - 400 K/uL   nRBC 0.0 0.0 - 0.2 %    Comment: Performed at [facility], [address] [place], [place] [address]  Basic metabolic panel     Status: Abnormal   Collection Time: 05/05/19  5:28 AM  Result Value Ref Range   Sodium 136 135 - 145 mmol/L   Potassium 3.5 3.5 - 5.1 mmol/L   Chloride 103 98 - 111 mmol/L   CO2 26 22 - 32 mmol/L   Glucose, Bld 261 (H) 70 - 99 mg/dL   BUN 13 6 - 20 mg/dL   Creatinine, Ser 1.01 (H) 0.44 - 1.00 mg/dL   Calcium 8.0 (L) 8.9 - 10.3 mg/dL   GFR calc non Af Amer >60 >60 mL/min   GFR calc Af Amer  >60 >60 mL/min   Anion gap 7 5 - 15    Comment: Performed at [facility], [address] [place],  [address]  Hemoglobin A1c     Status: Abnormal   Collection Time: 05/05/19  5:28 AM  Result Value Ref Range   Hgb A1c MFr Bld 12.7 (H) 4.8 - 5.6 %    Comment: (NOTE) Pre diabetes:          5.7%-6.4% Diabetes:              >6.4% Glycemic control for   <7.0% adults with diabetes    Mean Plasma Glucose 317.79 mg/dL    Comment: Performed at [facility] [address] [place], [place] [address]  Glucose, capillary     Status: Abnormal   Collection Time: 05/05/19  8:20 AM  Result Value Ref Range   Glucose-Capillary 283 (H) 70 -  99 mg/dL  Glucose, capillary     Status: Abnormal   Collection Time: 05/05/19 12:38 PM  Result Value Ref Range   Glucose-Capillary 196 (H) 70 - 99 mg/dL   Comment 1 Notify RN    Comment 2 Document in Chart    Dg Chest 2 View  Result Date: 05/04/2019 CLINICAL DATA:  Hyperglycemia EXAM: CHEST - 2 VIEW COMPARISON:  04/14/2008 report FINDINGS: Low lung volumes. No consolidation or effusion. Normal heart size. No pneumothorax. IMPRESSION: No active cardiopulmonary disease. Electronically Signed   By: Donavan Foil M.D.   On: 05/04/2019 20:31   US Venous Img Lower Unilateral Right  Result Date: 05/05/2019 CLINICAL DATA:  Right lower extremity swelling EXAM: RIGHT LOWER EXTREMITY VENOUS DOPPLER ULTRASOUND TECHNIQUE: Gray-scale sonography with graded compression, as well as color Doppler and duplex ultrasound were performed to evaluate the lower extremity deep venous systems from the level of the common femoral vein and including the common femoral, femoral, profunda femoral, popliteal and calf veins including the posterior tibial, peroneal and gastrocnemius veins when visible. The superficial great saphenous vein was also interrogated. Spectral Doppler was utilized to evaluate flow at rest and with distal augmentation maneuvers in the common femoral,  femoral and popliteal veins. COMPARISON:  None. FINDINGS: Contralateral Common Femoral Vein: Respiratory phasicity is normal and symmetric with the symptomatic side. No evidence of thrombus. Normal compressibility. Common Femoral Vein: No evidence of thrombus. Normal compressibility, respiratory phasicity and response to augmentation. Saphenofemoral Junction: No evidence of thrombus. Normal compressibility and flow on color Doppler imaging. Profunda Femoral Vein: No evidence of thrombus. Normal compressibility and flow on color Doppler imaging. Femoral Vein: No evidence of thrombus. Normal compressibility, respiratory phasicity and response to augmentation. Popliteal Vein: No evidence of thrombus. Normal compressibility, respiratory phasicity and response to augmentation. Calf Veins: No evidence of thrombus. Normal compressibility and flow on color Doppler imaging. Superficial Great Saphenous Vein: No evidence of thrombus. Normal compressibility. Venous Reflux:  None. Other Findings:  None. IMPRESSION: No evidence of deep venous thrombosis. Electronically Signed   By: Rolm Baptise M.D.   On: 05/05/2019 00:28    Pending Labs Unresulted Labs (From admission, onward)    Start     Ordered   05/06/19 0500  CBC  Tomorrow morning,   STAT     05/05/19 1307   05/06/19 6962  Basic metabolic panel  Tomorrow morning,   STAT     05/05/19 1307   05/05/19 1410  SARS CORONAVIRUS 2  Once,   STAT     05/05/19 1410   05/05/19 0137  HIV antibody (Routine Testing)  Once,   STAT     05/05/19 0139   05/04/19 2329  Urine Culture  Add-on,   AD     05/04/19 2328          Vitals/Pain Today's Vitals   05/05/19 0745 05/05/19 0811 05/05/19 1245 05/05/19 1350  BP:  101/62 109/67   Pulse:  75 76   Resp:   17   Temp: 99 F (37.2 C)     TempSrc:      SpO2:  97% 97%   Weight:      Height:      PainSc:    Asleep    Isolation Precautions No active isolations  Medications Medications  lisinopril (ZESTRIL) tablet  10 mg (10 mg Oral Not Given 05/05/19 0941)  pioglitazone (ACTOS) tablet 30 mg (30 mg Oral Given 05/05/19 0937)  enoxaparin (LOVENOX) injection 40 mg (40 mg  Subcutaneous Given 05/05/19 0936)  0.9 %  sodium chloride infusion ( Intravenous New Bag/Given 05/05/19 0528)  acetaminophen (TYLENOL) tablet 650 mg (has no administration in time range)    Or  acetaminophen (TYLENOL) suppository 650 mg (has no administration in time range)  oxyCODONE (Oxy IR/ROXICODONE) immediate release tablet 5 mg (has no administration in time range)  traZODone (DESYREL) tablet 25 mg (has no administration in time range)  ketorolac (TORADOL) 30 MG/ML injection 15 mg (has no administration in time range)  magnesium hydroxide (MILK OF MAGNESIA) suspension 30 mL (has no administration in time range)  ondansetron (ZOFRAN) tablet 4 mg (has no administration in time range)    Or  ondansetron (ZOFRAN) injection 4 mg (has no administration in time range)  insulin aspart (novoLOG) injection 0-20 Units (4 Units Subcutaneous Given 05/05/19 1304)  piperacillin-tazobactam (ZOSYN) IVPB 3.375 g (0 g Intravenous Stopped 05/05/19 1054)  vancomycin (VANCOCIN) IVPB 750 mg/150 ml premix (has no administration in time range)  insulin starter kit- pen needles (English) 1 kit (has no administration in time range)  living well with diabetes book MISC (has no administration in time range)  sodium chloride 0.9 % bolus 1,000 mL (0 mLs Intravenous Stopped 05/05/19 0241)  acetaminophen (TYLENOL) tablet 1,000 mg (1,000 mg Oral Given 05/05/19 0034)  sodium chloride 0.9 % bolus 1,000 mL (0 mLs Intravenous Stopped 05/05/19 0121)  vancomycin (VANCOCIN) IVPB 1000 mg/200 mL premix (0 mg Intravenous Stopped 05/05/19 0500)  piperacillin-tazobactam (ZOSYN) IVPB 3.375 g (0 g Intravenous Stopped 05/05/19 0315)  vancomycin (VANCOCIN) IVPB 1000 mg/200 mL premix (0 mg Intravenous Stopped 05/05/19 0742)    Mobility walks Low fall risk   Focused Assessments Cardiac Assessment  Handoff:  Cardiac Rhythm: Normal sinus rhythm No results found for: CKTOTAL, CKMB, CKMBINDEX, TROPONINI No results found for: DDIMER Does the Patient currently have chest pain? No      R Recommendations: See Admitting Provider Note  Report given to:   Additional Notes: 0

## 2019-05-05 NOTE — ED Notes (Signed)
Walked pt to the restroom and assisted back in bed. Pt is resting and watching TV.

## 2019-05-05 NOTE — Progress Notes (Signed)
CODE SEPSIS - PHARMACY COMMUNICATION  **Broad Spectrum Antibiotics should be administered within 1 hour of Sepsis diagnosis**  Time Code Sepsis Called/Page Received: @ 0045  Antibiotics Ordered: Ceftriaxone and vancomycin   Time of 1st antibiotic administration: @ [place], PharmD, BCPS Clinical Pharmacist 05/05/2019 1:47 AM

## 2019-05-05 NOTE — Progress Notes (Addendum)
Inpatient Diabetes Program Recommendations  AACE/ADA: New Consensus Statement on Inpatient Glycemic Control   Target Ranges:  Prepandial:   less than 140 mg/dL      Peak postprandial:   less than 180 mg/dL (1-2 hours)      Critically ill patients:  140 - 180 mg/dL  Results for Tammy Beasley, Tammy Beasley (MRN [id number]) as of 05/05/2019 14:04  Ref. Range 05/04/2019 18:31 05/05/2019 08:20 05/05/2019 12:38  Glucose-Capillary Latest Ref Range: 70 - 99 mg/dL 299 (H) 283 (H)  Novolog 11 units  Actos 30 mg 196 (H)  Novolog 4 units   Results for Tammy Beasley, Tammy Beasley (MRN [id number]) as of 05/05/2019 14:04  Ref. Range 05/05/2019 05:28  Hemoglobin A1C Latest Ref Range: 4.8 - 5.6 % 12.7 (H)   Review of Glycemic Control  Diabetes history: DM2 Outpatient Diabetes medications: Metformin [phone] mg QHS, Actos 30 mg daily Current orders for Inpatient glycemic control: Actos 30 mg daily, Novolog 0-20 units TID with meals  Inpatient Diabetes Program Recommendations:   Insulin-Basal: Please consider ordering Lantus 10 units QHS (based on 99.8 kg x 0.1 units).  Correction (SSI): Please consider ordering Novolog 0-5 units QHS for bedtime correction.  HgbA1C: A1C 12.7% on 05/05/19 indicating an average glucose of 318 mg/dl over the past 2-3 months. Recommend patient be discharged on insulin.  NOTE: Spoke with patient about diabetes and home regimen for diabetes control. Patient reports being followed by PCP for diabetes management and currently taking Metformin 1000 mg daily and Actos 30 mg daily as an outpatient for diabetes control. Patient reports taking DM medications as prescribed. Patient reports checking glucose 1-2 times per day and it has been running consistently in the 200's and up to 450 mg/dl over the past week.  Patient endorses symptoms of hyperglycemia (blurry vision, fatigue, frequent urination and thirst). Patient notes she has also had frequent UTIs and yeast infections.   Discussed A1C results (12.7% on  05/05/19) and explained that current A1C indicates an average glucose of 318 mg/dl over the past 2-3 months. Discussed glucose and A1C goals. Discussed importance of checking CBGs and maintaining good CBG control to prevent long-term and short-term complications. Explained how hyperglycemia leads to damage within blood vessels which lead to the common complications seen with uncontrolled diabetes. Stressed to the patient the importance of improving glycemic control to prevent further complications from uncontrolled diabetes. Discussed impact of nutrition, exercise, stress, sickness, and medications on diabetes control.  Discussed carbohydrates, carbohydrate goals per day and meal, along with portion sizes.  Patient states that she has been following a keto diet to try to help DM control and she has lost 25 pounds over the past month. Patient states that she has always tried to avoid taking insulin but she feels that she is going to need to start taking insulin to get DM controlled and feel better. 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  Discussed Lantus, Levemir, and Novolog insulin and informed patient she is currently ordered Novolog and it would be recommended to order low dose Lantus.  Not able to tell if Lantus or Levemir is preferred insulin with insurance so discussed both.  Reviewed and demonstrated how to use vial/syringe and insulin pens. Patient states that she would prefer to use an insulin pen and she was able to successfully demonstrate how to use an insulin pen.  Informed patient that a Living Well with DM and an insulin starter kit would be ordered. Encouraged patient to  read over information once received.   Encouraged patient to check glucose 4 times per day (before meals and at bedtime), take DM medications consistently as prescribed, to make a follow up appointment with PCP, and ask PCP about referring to an Endocrinologist.  Patient verbalized  understanding of information discussed and reports no further questions at this time related to diabetes. Ordered patient education by RNs and asked that RNs allow patient to self administer insulin injections while inpatient.  Thanks, Barnie Alderman, RN, MSN, CDE Diabetes Coordinator Inpatient Diabetes Program [phone] (Team Pager from 8am to 5pm)

## 2019-05-05 NOTE — ED Notes (Signed)
Pt given a phone to call her husband and give an update

## 2019-05-05 NOTE — ED Notes (Signed)
Patient is resting comfortably. 

## 2019-05-05 NOTE — Progress Notes (Addendum)
Patient admitted early this morning.  Seen and examined by me later in the morning.  Please see H&P for further details.  Patient states she is feeling much better this morning.  Her right lower extremity erythema is unchanged from last night.  She denies any fevers or chills.  On exam, she has erythema and warmth extending throughout the entire right lower extremity.  No obvious lesions.   -Sepsis is resolving, leukocytosis has improved -Continue IV fluids and broad-spectrum antibiotics -Follow-up blood and urine cultures -RLE duplex US negative for DVT  Hyman Bible, MD

## 2019-05-05 NOTE — ED Notes (Signed)
Pt given meal tray.

## 2019-05-05 NOTE — Progress Notes (Signed)
Pharmacy Antibiotic Note  Tammy Beasley is a 48 y.o. female admitted on 05/04/2019 with sepsis / cellulitis.  Pharmacy has been consulted for Vancomycin and Zosyn dosing.  Pt received Zosyn 3.375gm in ED at  0242 and Vancomycin 2000mg  at 0500   Plan: Zosyn 3.375gm IV q8hrs (4 hour infusion) Rocephin 2gm IV in ED x 1 per MD Vancomycin 750mg  IV Q 12 hrs. Goal AUC 400-550. Expected AUC: 438 SCr used: 0.8  Height: 5\' 5"  (165.1 cm) Weight: 220 lb (99.8 kg) IBW/kg (Calculated) : 57  Temp (24hrs), Avg:102.1 F (38.9 C), Min:102.1 F (38.9 C), Max:102.1 F (38.9 C)  Recent Labs  Lab 05/04/19 1904  WBC 18.2*  CREATININE 0.75  LATICACIDVEN 1.5    Estimated Creatinine Clearance: 100.6 mL/min (by C-G formula based on SCr of 0.75 mg/dL).    No Known Allergies  Antimicrobials this admission: 0804 Vancomycin >>  0804 Zosyn >>  0804 Rocephin x 1 in ED  Dose adjustments this admission:  Microbiology results: 0804 BCx: pending 0803 UCx: pending   Thank you for allowing pharmacy to be a part of this patient's care.  Hart Robinsons A 05/05/2019 4:59 AM

## 2019-05-05 NOTE — H&P (Addendum)
[facility] [place] NAME: Tammy Beasley    MR#:  [id number]  DATE OF BIRTH:  [birth year]  DATE OF ADMISSION: 05/05/2019  PRIMARY CARE PHYSICIAN: Carolanne Grumbling, MD   REQUESTING/REFERRING PHYSICIAN: Gonzella Lex, MD CHIEF COMPLAINT:   Chief Complaint  Patient presents with   Hyperglycemia  Right leg swelling and pain  HISTORY OF PRESENT ILLNESS:  Tammy Beasley  is a 48 y.o. African-American female with a known history of diabetes mellitus, hypertension and dyslipidemia as well as tobacco abuse, who presented to the emergency room with acute onset of right leg pain and swelling which has been going on over the last couple of days with associated fever and chills.  No temperature was up to 102.  She is noted elevated blood glucose measures.  She denied any cough or wheezing or shortness of breath.  No chest pain or dyspnea or palpitations.  No dysuria, oliguria or hematuria or flank pain.  No recent sick exposures.  Upon presentation to the emergency room, temperature was 102.1 and heart rate 128 with otherwise normal vital signs.  Labs revealed mild hypokalemia of 3.4 and significantly cytosis with 18.2 and neutrophilia.  Lactic acid was 1.5 and blood glucose was 311.  Urinalysis positive for UTI and her pregnancy test came back negative.  COVID-19 test is pending.  Right lower extremity venous duplex came back negative for DVT.  The patient had 2 blood cultures drawn.  She was started on IV vancomycin and Rocephin as well as given a liter bolus of IV normal saline and 1 g of p.o. Tylenol.  She will be admitted to a medically monitored bed for further evaluation and management. PAST MEDICAL HISTORY:   Past Medical History:  Diagnosis Date   Diabetes mellitus without complication ([place])    Hypertension   Dyslipidemia, ongoing tobacco abuse  PAST SURGICAL HISTORY:  History reviewed. No pertinent surgical history.  She denies  any previous surgeries.  SOCIAL HISTORY:   Social History   Tobacco Use   Smoking status: Current Every Day Smoker    Types: Cigarettes   Smokeless tobacco: Never Used  Substance Use Topics   Alcohol use: Not Currently    FAMILY HISTORY:  Positive for diabetes mellitus in her sister and mother.  DRUG ALLERGIES:  No Known Allergies  REVIEW OF SYSTEMS:   ROS As per history of present illness. All pertinent systems were reviewed above. Constitutional,  HEENT, cardiovascular, respiratory, GI, GU, musculoskeletal, neuro, psychiatric, endocrine,  integumentary and hematologic systems were reviewed and are otherwise  negative/unremarkable except for positive findings mentioned above in the HPI.   MEDICATIONS AT HOME:   Prior to Admission medications   Medication Sig Start Date End Date Taking? Authorizing Provider  lisinopril (ZESTRIL) 10 MG tablet Take 10 mg by mouth daily.   Yes [provider]  metFORMIN (GLUCOPHAGE XR) 500 MG 24 hr tablet Take 1 tablet (500 mg total) by mouth daily with breakfast. Take 1 or 2 by mouth daily before bed Patient taking differently: Take 500-1,000 mg by mouth at bedtime.  03/22/19  Yes Nena Polio, MD  pioglitazone (ACTOS) 30 MG tablet Take 1 tablet (30 mg total) by mouth daily. 03/22/19 03/21/20 Yes Nena Polio, MD  fluconazole (DIFLUCAN) 150 MG tablet Take 1 tablet (150 mg total) by mouth daily. Take 1 pill now 1 in 3 days and 1 when you finish the antibiotics. Patient not taking: Reported on 05/05/2019 03/22/19  Nena Polio, MD      VITAL SIGNS:  Blood pressure 117/71, pulse 81, temperature (!) 102.1 F (38.9 C), temperature source Oral, resp. rate (!) 22, height 5\' 5"  (1.651 m), weight 99.8 kg, SpO2 95 %.  PHYSICAL EXAMINATION:  Physical Exam  GENERAL:  48 y.o.-year-old African-American patient lying in the bed with no acute distress.  EYES: Pupils equal, round, reactive to light and accommodation. No scleral icterus.  Extraocular muscles intact.  HEENT: Head atraumatic, normocephalic. Oropharynx and nasopharynx clear.  NECK:  Supple, no jugular venous distention. No thyroid enlargement, no tenderness.  LUNGS: Normal breath sounds bilaterally, no wheezing, rales,rhonchi or crepitation. No use of accessory muscles of respiration.  CARDIOVASCULAR: Regular rate and rhythm, S1, S2 normal. No murmurs, rubs, or gallops.  ABDOMEN: Soft, nondistended, nontender. Bowel sounds present. No organomegaly or mass.  EXTREMITIES/skin: No pedal pitting edema, cyanosis, or clubbing.  She has right anterior and posterior leg and calf mild erythema with warmth and tenderness without induration or fluctuation.  She had negative Homans sign. NEUROLOGIC: Cranial nerves II through XII are intact. Muscle strength 5/5 in all extremities. Sensation intact. Gait not checked.  PSYCHIATRIC: The patient is alert and oriented x 3.  Normal affect and good eye contact. SKIN: As above otherwise no other obvious rash, lesion, or ulcer.   LABORATORY PANEL:   CBC Recent Labs  Lab 05/04/19 1904  WBC 18.2*  HGB 14.7  HCT 45.3  PLT 255   ------------------------------------------------------------------------------------------------------------------  Chemistries  Recent Labs  Lab 05/04/19 1904  NA 132*  K 3.4*  CL 97*  CO2 23  GLUCOSE 311*  BUN 10  CREATININE 0.75  CALCIUM 9.2  AST 24  ALT 23  ALKPHOS 110  BILITOT 1.8*   ------------------------------------------------------------------------------------------------------------------  Cardiac Enzymes No results for input(s): TROPONINI in the last 168 hours. ------------------------------------------------------------------------------------------------------------------  RADIOLOGY:  Dg Chest 2 View  Result Date: 05/04/2019 CLINICAL DATA:  Hyperglycemia EXAM: CHEST - 2 VIEW COMPARISON:  04/14/2008 report FINDINGS: Low lung volumes. No consolidation or effusion. Normal  heart size. No pneumothorax. IMPRESSION: No active cardiopulmonary disease. Electronically Signed   By: Donavan Foil M.D.   On: 05/04/2019 20:31   US Venous Img Lower Unilateral Right  Result Date: 05/05/2019 CLINICAL DATA:  Right lower extremity swelling EXAM: RIGHT LOWER EXTREMITY VENOUS DOPPLER ULTRASOUND TECHNIQUE: Gray-scale sonography with graded compression, as well as color Doppler and duplex ultrasound were performed to evaluate the lower extremity deep venous systems from the level of the common femoral vein and including the common femoral, femoral, profunda femoral, popliteal and calf veins including the posterior tibial, peroneal and gastrocnemius veins when visible. The superficial great saphenous vein was also interrogated. Spectral Doppler was utilized to evaluate flow at rest and with distal augmentation maneuvers in the common femoral, femoral and popliteal veins. COMPARISON:  None. FINDINGS: Contralateral Common Femoral Vein: Respiratory phasicity is normal and symmetric with the symptomatic side. No evidence of thrombus. Normal compressibility. Common Femoral Vein: No evidence of thrombus. Normal compressibility, respiratory phasicity and response to augmentation. Saphenofemoral Junction: No evidence of thrombus. Normal compressibility and flow on color Doppler imaging. Profunda Femoral Vein: No evidence of thrombus. Normal compressibility and flow on color Doppler imaging. Femoral Vein: No evidence of thrombus. Normal compressibility, respiratory phasicity and response to augmentation. Popliteal Vein: No evidence of thrombus. Normal compressibility, respiratory phasicity and response to augmentation. Calf Veins: No evidence of thrombus. Normal compressibility and flow on color Doppler imaging. Superficial Great Saphenous Vein: No evidence  of thrombus. Normal compressibility. Venous Reflux:  None. Other Findings:  None. IMPRESSION: No evidence of deep venous thrombosis. Electronically Signed    By: Rolm Baptise M.D.   On: 05/05/2019 00:28      IMPRESSION AND PLAN:   1.  Right lower extremity cellulitis with subsequent sepsis without severe sepsis or septic shock.  The patient will be admitted in a medically monitored bed.  We will continue antibiotic therapy with IV vancomycin and Zosyn.  Warm compresses will be utilized.  Will follow blood cultures.  2.  UTI, likely contributing to her sepsis.  This should be covered with IV Zosyn.  Urine culture will be followed.  3.  Uncontrolled type diabetes mellitus.  The patient will be placed on supplemental coverage with NovoLog and continued on her Actos while holding off metformin.  4.  Hypertension.  We will continue lisinopril.  5.  DVT prophylaxis.  Subcutaneous Lovenox   All the records are reviewed and case discussed with ED provider. The plan of care was discussed in details with the patient (and family). I answered all questions. The patient agreed to proceed with the above mentioned plan. Further management will depend upon hospital course.   CODE STATUS: Full code  TOTAL TIME TAKING CARE OF THIS PATIENT: 50 minutes.    Christel Mormon M.D on 05/05/2019 at 2:45 AM  Pager - [phone]  After 6pm go to [email or web address] - [employer] [facility]  Office  [phone]  CC: Primary care physician; Carolanne Grumbling, MD   Note: This dictation was prepared with Dragon dictation along with smaller phrase technology. Any transcriptional errors that result from this process are unintentional.

## 2019-05-06 LAB — BASIC METABOLIC PANEL
Anion gap: 7 (ref 5–15)
BUN: 11 mg/dL (ref 6–20)
CO2: 24 mmol/L (ref 22–32)
Calcium: 7.9 mg/dL — ABNORMAL LOW (ref 8.9–10.3)
Chloride: 106 mmol/L (ref 98–111)
Creatinine, Ser: 0.92 mg/dL (ref 0.44–1.00)
GFR calc Af Amer: >60 mL/min (ref >60)
GFR calc non Af Amer: >60 mL/min (ref >60)
Glucose, Bld: 144 mg/dL — ABNORMAL HIGH (ref 70–99)
Potassium: 3.3 mmol/L — ABNORMAL LOW (ref 3.5–5.1)
Sodium: 137 mmol/L (ref 135–145)

## 2019-05-06 LAB — URINE CULTURE: Culture: 10000 — AB

## 2019-05-06 LAB — CBC
HCT: 37.9 % (ref 36.0–46.0)
Hemoglobin: 12 g/dL (ref 12.0–15.0)
MCH: 26 pg (ref 26.0–34.0)
MCHC: 31.7 g/dL (ref 30.0–36.0)
MCV: 82.2 fL (ref 80.0–100.0)
Platelets: 195 10*3/uL (ref 150–400)
RBC: 4.61 MIL/uL (ref 3.87–5.11)
RDW: 13.8 % (ref 11.5–15.5)
WBC: 12.6 10*3/uL — ABNORMAL HIGH (ref 4.0–10.5)
nRBC: 0 % (ref 0.0–0.2)

## 2019-05-06 LAB — GLUCOSE, CAPILLARY
Glucose-Capillary: 155 mg/dL — ABNORMAL HIGH (ref 70–99)
Glucose-Capillary: 164 mg/dL — ABNORMAL HIGH (ref 70–99)
Glucose-Capillary: 164 mg/dL — ABNORMAL HIGH (ref 70–99)
Glucose-Capillary: 209 mg/dL — ABNORMAL HIGH (ref 70–99)

## 2019-05-06 LAB — HIV ANTIBODY (ROUTINE TESTING W REFLEX): HIV Screen 4th Generation wRfx: NONREACTIVE

## 2019-05-06 MED ORDER — POTASSIUM CHLORIDE CRYS ER 20 MEQ PO TBCR
40.0000 meq | EXTENDED_RELEASE_TABLET | Freq: Once | ORAL | Status: AC
  Administered 2019-05-06: 14:00:00 40 meq via ORAL
  Filled 2019-05-06: qty 2

## 2019-05-06 NOTE — Progress Notes (Signed)
[facility] [place] NAME: Tammy Beasley    MR#:  [id number]  DATE OF BIRTH:  12-Oct-1970  SUBJECTIVE: Patient is admitted yesterday for right leg cellulitis, sepsis, both are improving, still has significant pain, unable to ambulate, patient said she has this recurrent problem cellulitis it have been once before but this time is worse.  Denies any insect bites.  CHIEF COMPLAINT:   Chief Complaint  Patient presents with  . Hyperglycemia    REVIEW OF SYSTEMS:   ROS CONSTITUTIONAL: No fever, fatigue or weakness.  EYES: No blurred or double vision.  EARS, NOSE, AND THROAT: No tinnitus or ear pain.  RESPIRATORY: No cough, shortness of breath, wheezing or hemoptysis.  CARDIOVASCULAR: No chest pain, orthopnea, edema.  GASTROINTESTINAL: No nausea, vomiting, diarrhea or abdominal pain.  GENITOURINARY: No dysuria, hematuria.  ENDOCRINE: No polyuria, nocturia,  HEMATOLOGY: No anemia, easy bruising or bleeding SKIN: No rash or lesion. MUSCULOSKELETAL right leg erythema, swelling present NEUROLOGIC: No tingling, numbness, weakness.  PSYCHIATRY: No anxiety or depression.   DRUG ALLERGIES:  No Known Allergies  VITALS:  Blood pressure 111/73, pulse 69, temperature 98.5 F (36.9 C), resp. rate 18, height 5\' 5"  (1.651 m), weight 99.8 kg, SpO2 99 %.  PHYSICAL EXAMINATION:  GENERAL:  48 y.o.-year-old patient lying in the bed with no acute distress.  EYES: Pupils equal, round, reactive to light and accommodation. No scleral icterus. Extraocular muscles intact.  HEENT: Head atraumatic, normocephalic. Oropharynx and nasopharynx clear.  NECK:  Supple, no jugular venous distention. No thyroid enlargement, no tenderness.  LUNGS: Normal breath sounds bilaterally, no wheezing, rales,rhonchi or crepitation. No use of accessory muscles of respiration.  CARDIOVASCULAR: S1, S2 normal. No murmurs, rubs, or gallops.  ABDOMEN: Soft, nontender,  nondistended. Bowel sounds present. No organomegaly or mass.  EXTREMITIES: Edema, erythema of the right leg and tender to palpation.  NEUROLOGIC: Cranial nerves II through XII are intact. Muscle strength 5/5 in all extremities. Sensation intact. Gait not checked.  PSYCHIATRIC: The patient is alert and oriented x 3.  SKIN: No obvious rash, lesion, or ulcer.    LABORATORY PANEL:   CBC Recent Labs  Lab 05/06/19 0444  WBC 12.6*  HGB 12.0  HCT 37.9  PLT 195   ------------------------------------------------------------------------------------------------------------------  Chemistries  Recent Labs  Lab 05/04/19 1904  05/06/19 0444  NA 132*   < > 137  K 3.4*   < > 3.3*  CL 97*   < > 106  CO2 23   < > 24  GLUCOSE 311*   < > 144*  BUN 10   < > 11  CREATININE 0.75   < > 0.92  CALCIUM 9.2   < > 7.9*  AST 24  --   --   ALT 23  --   --   ALKPHOS 110  --   --   BILITOT 1.8*  --   --    < > = values in this interval not displayed.   ------------------------------------------------------------------------------------------------------------------  Cardiac Enzymes No results for input(s): TROPONINI in the last 168 hours. ------------------------------------------------------------------------------------------------------------------  RADIOLOGY:  Dg Chest 2 View  Result Date: 05/04/2019 CLINICAL DATA:  Hyperglycemia EXAM: CHEST - 2 VIEW COMPARISON:  04/14/2008 report FINDINGS: Low lung volumes. No consolidation or effusion. Normal heart size. No pneumothorax. IMPRESSION: No active cardiopulmonary disease. Electronically Signed   By: Donavan Foil M.D.   On: 05/04/2019 20:31   US Venous Img Lower Unilateral Right  Result Date:  05/05/2019 CLINICAL DATA:  Right lower extremity swelling EXAM: RIGHT LOWER EXTREMITY VENOUS DOPPLER ULTRASOUND TECHNIQUE: Gray-scale sonography with graded compression, as well as color Doppler and duplex ultrasound were performed to evaluate the lower  extremity deep venous systems from the level of the common femoral vein and including the common femoral, femoral, profunda femoral, popliteal and calf veins including the posterior tibial, peroneal and gastrocnemius veins when visible. The superficial great saphenous vein was also interrogated. Spectral Doppler was utilized to evaluate flow at rest and with distal augmentation maneuvers in the common femoral, femoral and popliteal veins. COMPARISON:  None. FINDINGS: Contralateral Common Femoral Vein: Respiratory phasicity is normal and symmetric with the symptomatic side. No evidence of thrombus. Normal compressibility. Common Femoral Vein: No evidence of thrombus. Normal compressibility, respiratory phasicity and response to augmentation. Saphenofemoral Junction: No evidence of thrombus. Normal compressibility and flow on color Doppler imaging. Profunda Femoral Vein: No evidence of thrombus. Normal compressibility and flow on color Doppler imaging. Femoral Vein: No evidence of thrombus. Normal compressibility, respiratory phasicity and response to augmentation. Popliteal Vein: No evidence of thrombus. Normal compressibility, respiratory phasicity and response to augmentation. Calf Veins: No evidence of thrombus. Normal compressibility and flow on color Doppler imaging. Superficial Great Saphenous Vein: No evidence of thrombus. Normal compressibility. Venous Reflux:  None. Other Findings:  None. IMPRESSION: No evidence of deep venous thrombosis. Electronically Signed   By: Rolm Baptise M.D.   On: 05/05/2019 00:28    EKG:   Orders placed or performed during the hospital encounter of 05/04/19  . ED EKG  . ED EKG    ASSESSMENT AND PLAN:   48 year old obese female history of type 2 diabetes mellitus, dyslipidemia comes in with fever, right leg cellulitis. #1 right leg cellulitis with sepsis, patient has no septic shock on admission, had high fever on admission, elevated white count, patient feeling better  now with IV vancomycin, blood cultures are negative.  WBC decreasing, likely discharge home tomorrow with oral antibiotics, likely doxycycline or Bactrim. 2.  Fever, COVID-19 test is negative. 3.  Diabetes mellitus type 2, uncontrolled, hemoglobin A1c 12.7, seen by diabetes coordinator, recommend starting Lantus, discharged patient with insulin.  Patient is aware, getting education, injecting insulin herself here.  Was on metformin, Actos at home, seen by diabetes coordinator, added education.  All the records are reviewed and case discussed with Care Management/Social Workerr. Management plans discussed with the patient, family and they are in agreement.  CODE STATUS: Full code  TOTAL TIME TAKING CARE OF THIS PATIENT: 11  POSSIBLE D/C IN 1-2 DAYS, DEPENDING ON CLINICAL CONDITION.   Epifanio Lesches M.D on 05/06/2019 at 2:21 PM  Between 7am to 6pm - Pager - [phone]  After 6pm go to [email or web address] - password [email or web address] [facility]  [facility]  Office  [phone]  CC: Primary care physician; Carolanne Grumbling, MD   Note: This dictation was prepared with Dragon dictation along with smaller phrase technology. Any transcriptional errors that result from this process are unintentional.

## 2019-05-06 NOTE — Progress Notes (Signed)
Inpatient Diabetes Program Recommendations  AACE/ADA: New Consensus Statement on Inpatient Glycemic Control   Target Ranges:  Prepandial:   less than 140 mg/dL      Peak postprandial:   less than 180 mg/dL (1-2 hours)      Critically ill patients:  140 - 180 mg/dL   Results for Tammy Beasley, Tammy Beasley (MRN [id number]) as of 05/06/2019 15:09  Ref. Range 05/05/2019 08:20 05/05/2019 12:38 05/05/2019 16:49 05/05/2019 21:08 05/06/2019 07:54 05/06/2019 11:44  Glucose-Capillary Latest Ref Range: 70 - 99 mg/dL 283 (H) 196 (H) 155 (H) 216 (H) 155 (H) 164 (H)   Review of Glycemic Control  Diabetes history: DM2 Outpatient Diabetes medications: Metformin [id number] mg QHS, Actos 30 mg daily Current orders for Inpatient glycemic control: Lantus 10 units daily, Actos 30 mg daily, Novolog 0-20 units TID with meals  Inpatient Diabetes Program Recommendations:   HgbA1C: A1C 12.7% on 05/05/19 indicating an average glucose of 318 mg/dl over the past 2-3 months. Recommend patient be discharged on basal insulin and she prefers insulin pens. Have asked patient to call insurance and see which basal insulin is preferred.  NOTE: Spoke to patient over the phone regarding insulin administration. Patient states that she has been self administering insulin and she is feeling more comfortable each time. Patient is very pleased with glucose levels today being in the 100's mg/dl. Patient states that she has no questions at this time regarding insulin or DM. Informed patient that I could not tell which basal insulin her insurance prefers so patient was asked to call insurance company and find out which insulin is preferred. Discussed using savings card which can be found on the internet. Explained that savings cards could decrease copays and patient plans to look for savings cards once she finds out which insulin is preferred by her insurance. Patient prefers to use insulin pens and will need prescriptions for basal insulin pens (pt to find out  which basal insulin is preferred - Lantus, Levemir, or Basaglar) and insulin pen needles at time of discharge. Patient verbalized understanding of information discussed.   Thanks, Barnie Alderman, RN, MSN, CDE Diabetes Coordinator Inpatient Diabetes Program [phone] (Team Pager from 8am to 5pm)

## 2019-05-06 NOTE — Progress Notes (Signed)
Pt educated and able to demonstrate proper self insulin administration

## 2019-05-07 LAB — BASIC METABOLIC PANEL
Anion gap: 6 (ref 5–15)
BUN: 11 mg/dL (ref 6–20)
CO2: 25 mmol/L (ref 22–32)
Calcium: 8.3 mg/dL — ABNORMAL LOW (ref 8.9–10.3)
Chloride: 111 mmol/L (ref 98–111)
Creatinine, Ser: 0.85 mg/dL (ref 0.44–1.00)
GFR calc Af Amer: >60 mL/min (ref >60)
GFR calc non Af Amer: >60 mL/min (ref >60)
Glucose, Bld: 149 mg/dL — ABNORMAL HIGH (ref 70–99)
Potassium: 3.8 mmol/L (ref 3.5–5.1)
Sodium: 142 mmol/L (ref 135–145)

## 2019-05-07 LAB — GLUCOSE, CAPILLARY
Glucose-Capillary: 148 mg/dL — ABNORMAL HIGH (ref 70–99)
Glucose-Capillary: 229 mg/dL — ABNORMAL HIGH (ref 70–99)

## 2019-05-07 MED ORDER — DOXYCYCLINE HYCLATE 100 MG PO TABS: 100.0000 mg | ORAL_TABLET | Freq: Two times a day (BID) | ORAL | 0 refills | Status: DC

## 2019-05-07 MED ORDER — INSULIN GLARGINE 100 UNIT/ML ~~LOC~~ SOLN: 10.0000 [IU] | Freq: Every day | SUBCUTANEOUS | 11 refills | Status: DC

## 2019-05-07 MED ORDER — FLUCONAZOLE 150 MG PO TABS: 150.0000 mg | ORAL_TABLET | Freq: Every day | ORAL | 0 refills | Status: DC

## 2019-05-07 MED ORDER — INSULIN ASPART 100 UNIT/ML ~~LOC~~ SOLN: 0.0000 [IU] | Freq: Three times a day (TID) | SUBCUTANEOUS | 11 refills | Status: DC

## 2019-05-07 MED ORDER — NOVOLOG FLEXPEN 100 UNIT/ML ~~LOC~~ SOPN: PEN_INJECTOR | SUBCUTANEOUS | 11 refills | Status: DC

## 2019-05-07 MED ORDER — DOXYCYCLINE HYCLATE 100 MG PO TABS
100.0000 mg | ORAL_TABLET | Freq: Two times a day (BID) | ORAL | Status: DC
  Administered 2019-05-07: 100 mg via ORAL
  Filled 2019-05-07: qty 1

## 2019-05-07 MED ORDER — LANTUS SOLOSTAR 100 UNIT/ML ~~LOC~~ SOPN: 10.0000 [IU] | PEN_INJECTOR | Freq: Every day | SUBCUTANEOUS | 11 refills | Status: DC

## 2019-05-07 NOTE — Progress Notes (Signed)
Discharge instructions given and went over with patient at bedside. Prescriptions given and reviewed. All questions answered. Patient discharged home with family. Madlyn Frankel, RN

## 2019-05-07 NOTE — Discharge Instructions (Signed)
Dose: 0-20 Units Freq: 3 times daily with meals Route: Abbott Start: 05/05/19 0800   Admin Instructions:  Do NOT hold if patient is NPO. Resistant Scale.   Order specific questions:  Correction coverage: Resistant (obese, steroids) CBG < 70: Implement Hypoglycemia Standing Orders and refer to Hypoglycemia Standing Orders sidebar report CBG 70 - 120: 0 units CBG 121 - 150: 3 units CBG 151 - 200: 4 units CBG 201 - 250: 7 units CBG 251 - 300: 11 units CBG 301 - 350: 15 units CBG 351 - 400: 20 units

## 2019-05-07 NOTE — Progress Notes (Signed)
Talked with pt by phone today about going home on Lantus and Novolog.  DM Coordinator spoke w/ pt yesterday about insulin for home as well.  Patient told me this AM that she would like to download some coupons for the Lantus and Novolog so she can get them for a reduced price with her insurance.  Asked me for insulin pens.  Sent Secure Chat to Dr. Vianne Bulls and asked her to please switch pt's d/c Rxs for Lantus and Novolog to insulin pens and pen needles.   Reviewed how to take Lantus and Novolog SSI at home (timing, etc).  Pt stated she is comfortable going home on insulin and doesn't have any further questions.  To make follow up appt with PCP soon after d/c.     --Will follow patient during hospitalization--  Wyn Quaker RN, MSN, CDE Diabetes Coordinator Inpatient Glycemic Control Team Team Pager: [phone] (8a-5p)

## 2019-05-10 LAB — CULTURE, BLOOD (ROUTINE X 2)
Culture: NO GROWTH
Culture: NO GROWTH
Special Requests: ADEQUATE

## 2019-05-10 NOTE — Discharge Summary (Signed)
Tammy Beasley, is a 48 y.o. female  DOB 1971/05/10  MRN [id number].  Admission date:  05/04/2019  Admitting Physician  Christel Mormon, MD  Discharge Date:  05/07/2019   Primary MD  Carolanne Grumbling, MD  Recommendations for primary care physician for things to follow:  Follow up with PCP in one week    Admission Diagnosis  Cellulitis of right lower extremity [L03.115] Sepsis without acute organ dysfunction, due to unspecified organism [facility]) [A41.9]   Discharge Diagnosis  Cellulitis of right lower extremity [L03.115] Sepsis without acute organ dysfunction, due to unspecified organism [facility]) [A41.9]   Active Problems:   Sepsis [facility])      Past Medical History:  Diagnosis Date  . Diabetes mellitus without complication ([place])   . Hypertension     History reviewed. No pertinent surgical history.     History of present illness and  Hospital Course:     Kindly see H&P for history of present illness and admission details, please review complete Labs, Consult reports and Test reports for all details in brief  HPI  from the history and physical done on the day of admission  48 yr old  Is admitted for right leg cellulitis/fever.has h/o DMII,morbid obesity,  Hospital Course    right leg cellulitis with sepsis, patient has no septic shock on admission, had high fever on admission, elevated white count, Cellulitis improved with IV vanco, blood cultures are negative.  WBC decreased, discharged home with doxycycline 100 mg po BID for 3 days.pt said she develops yeast infections in vagina with abx,so discharged home with diflucan. 2.  Fever, COVID-19 test is negative. 3.  Diabetes mellitus type 2, uncontrolled, hemoglobin A1c 12.7, seen by diabetes coordinator, recommend starting Lantus, discharged patient with Lantus 10 units  daily,.  Patient is aware, discontinued metformin,educated about insulin and also taght how to give insulin at home.discharged home with lantus and novolog pens.can use novo log  As per sliding scale.also continued on actos.   Discharge Condition:stable   Follow UP  Follow-up Information    Carolanne Grumbling, MD. Schedule an appointment as soon as possible for a visit in 1 week.   Specialty: Family Medicine Why: Patient to make own follow up appt Contact information: [place] [address] [phone]             Discharge Instructions  and  Discharge Medications     Allergies as of 05/07/2019   No Known Allergies     Medication List    STOP taking these medications   metFORMIN 500 MG 24 hr tablet Commonly known as: Glucophage XR     TAKE these medications   doxycycline 100 MG tablet Commonly known as: VIBRA-TABS Take 1 tablet (100 mg total) by mouth every 12 (twelve) hours.   fluconazole 150 MG tablet Commonly known as: Diflucan Take 1 tablet (150 mg total) by mouth daily. Take 1 pill now 1 in 3 days and 1 when you finish the antibiotics.   Lantus SoloStar 100 UNIT/ML Solostar Pen Generic drug: Insulin Glargine Inject 10 Units into the skin daily.   lisinopril 10 MG tablet Commonly known as: ZESTRIL Take 10 mg by mouth daily.   NovoLOG FlexPen 100 UNIT/ML FlexPen Generic drug: insulin aspart Freq: 3 times daily with meals Route: [place] Start: 05/05/19 0800  Admin Instructions: Do NOT hold if patient is NPO. Resistant Scale.  Order specific questions: Correction coverage: Resistant (obese, steroids) CBG < 70: Implement  Hypoglycemia Standing Orders and refer to Hypoglycemia Standing Orders sidebar report CBG 70 - 120: 0 units CBG 121 - 150: 3 units CBG 151 - 200: 4 units CBG 201 - 250: 7 units CBG 251 - 300: 11 units CBG 301 - 350: 15 units CBG 351 - 400: 20 units   pioglitazone 30 MG tablet Commonly known as: Actos Take 1 tablet  (30 mg total) by mouth daily.         Diet and Activity recommendation: See Discharge Instructions above   Consults obtained -Diabetes Coordinator   Major procedures and Radiology Reports - PLEASE review detailed and final reports for all details, in brief -      Dg Chest 2 View  Result Date: 05/04/2019 CLINICAL DATA:  Hyperglycemia EXAM: CHEST - 2 VIEW COMPARISON:  04/14/2008 report FINDINGS: Low lung volumes. No consolidation or effusion. Normal heart size. No pneumothorax. IMPRESSION: No active cardiopulmonary disease. Electronically Signed   By: Donavan Foil M.D.   On: 05/04/2019 20:31   US Venous Img Lower Unilateral Right  Result Date: 05/05/2019 CLINICAL DATA:  Right lower extremity swelling EXAM: RIGHT LOWER EXTREMITY VENOUS DOPPLER ULTRASOUND TECHNIQUE: Gray-scale sonography with graded compression, as well as color Doppler and duplex ultrasound were performed to evaluate the lower extremity deep venous systems from the level of the common femoral vein and including the common femoral, femoral, profunda femoral, popliteal and calf veins including the posterior tibial, peroneal and gastrocnemius veins when visible. The superficial great saphenous vein was also interrogated. Spectral Doppler was utilized to evaluate flow at rest and with distal augmentation maneuvers in the common femoral, femoral and popliteal veins. COMPARISON:  None. FINDINGS: Contralateral Common Femoral Vein: Respiratory phasicity is normal and symmetric with the symptomatic side. No evidence of thrombus. Normal compressibility. Common Femoral Vein: No evidence of thrombus. Normal compressibility, respiratory phasicity and response to augmentation. Saphenofemoral Junction: No evidence of thrombus. Normal compressibility and flow on color Doppler imaging. Profunda Femoral Vein: No evidence of thrombus. Normal compressibility and flow on color Doppler imaging. Femoral Vein: No evidence of thrombus. Normal  compressibility, respiratory phasicity and response to augmentation. Popliteal Vein: No evidence of thrombus. Normal compressibility, respiratory phasicity and response to augmentation. Calf Veins: No evidence of thrombus. Normal compressibility and flow on color Doppler imaging. Superficial Great Saphenous Vein: No evidence of thrombus. Normal compressibility. Venous Reflux:  None. Other Findings:  None. IMPRESSION: No evidence of deep venous thrombosis. Electronically Signed   By: Rolm Baptise M.D.   On: 05/05/2019 00:28    Micro Results    Recent Results (from the past 240 hour(s))  Urine Culture     Status: Abnormal   Collection Time: 05/04/19 10:36 PM   Specimen: Urine, Random  Result Value Ref Range Status   Specimen Description   Final    URINE, RANDOM Performed at [facility], [address], [place], [place] [address]    Special Requests   Final    NONE Performed at [facility], [address] [place], [place] [address]    Culture (A)  Final    10,000 COLONIES/mL GROUP B STREP(S.AGALACTIAE)ISOLATED TESTING AGAINST S. AGALACTIAE NOT ROUTINELY PERFORMED DUE TO PREDICTABILITY OF AMP/PEN/VAN SUSCEPTIBILITY. Performed at [facility], [place] [address], [place], [place] [address]    Report Status 05/06/2019 FINAL  Final  Blood Culture (routine x 2)     Status: None   Collection Time: 05/05/19  1:06 AM   Specimen: BLOOD  Result Value  Ref Range Status   Specimen Description BLOOD LEFT ASSIST CONTROL  Final   Special Requests   Final    BOTTLES DRAWN AEROBIC AND ANAEROBIC Blood Culture adequate volume   Culture   Final    NO GROWTH 5 DAYS Performed at [facility], [address] [place], [place] [address]    Report Status 05/10/2019 FINAL  Final  Blood Culture (routine x 2)     Status: None   Collection Time: 05/05/19  1:06 AM   Specimen: BLOOD  Result Value Ref Range Status   Specimen Description BLOOD RIGHT ASSIST CONTROL  Final    Special Requests   Final    BOTTLES DRAWN AEROBIC AND ANAEROBIC Blood Culture results may not be optimal due to an excessive volume of blood received in culture bottles   Culture   Final    NO GROWTH 5 DAYS Performed at [facility], [address] [place], [place] [address]    Report Status 05/10/2019 FINAL  Final  SARS CORONAVIRUS 2     Status: None   Collection Time: 05/05/19  2:10 PM  Result Value Ref Range Status   SARS Coronavirus 2 NEGATIVE NEGATIVE Final    Comment: (NOTE) SARS-CoV-2 target nucleic acids are NOT DETECTED. The SARS-CoV-2 RNA is generally detectable in upper and lower respiratory specimens during the acute phase of infection. Negative results do not preclude SARS-CoV-2 infection, do not rule out co-infections with other pathogens, and should not be used as the sole basis for treatment or other patient management decisions. Negative results must be combined with clinical observations, patient history, and epidemiological information. The expected result is Negative. Fact Sheet for Patients: SugarRoll.be Fact Sheet for Healthcare Providers: https://www.woods-mathews.com/ This test is not yet approved or cleared by the [place] FDA and  has been authorized for detection and/or diagnosis of SARS-CoV-2 by FDA under an Emergency Use Authorization (EUA). This EUA will remain  in effect (meaning this test can be used) for the duration of the COVID-19 declaration under Section 56 4(b)(1) of the Act, 21 U.S.C. section 360bbb-3(b)(1), unless the authorization is terminated or revoked sooner. Performed at [facility], [place] [address] [place], [place] [address]        Today   Subjective:   Tammy Beasley  With decreased right leg cellulitis.discharged home in stable condition.,pt is eager to go home.  Objective:   Blood pressure 129/81, pulse 69, temperature 98.5 F (36.9 C), temperature  source Oral, resp. rate 20, height 5\' 5"  (1.651 m), weight 99.8 kg, SpO2 98 %.  No intake or output data in the 24 hours ending 05/10/19 1358  Exam Awake Alert, Oriented x 3, No new F.N deficits, Normal affect .AT,PERRAL Supple Neck,No JVD, No cervical lymphadenopathy appriciated.  Symmetrical Chest wall movement, Good air movement bilaterally, CTAB RRR,No Gallops,Rubs or new Murmurs, No Parasternal Heave +ve B.Sounds, Abd Soft, Non tender, No organomegaly appriciated, No rebound -guarding or rigidity. No Cyanosis, Clubbing or edema, No new Rash or bruise  Data Review   CBC w Diff:  Lab Results  Component Value Date   WBC 12.6 (H) 05/06/2019   HGB 12.0 05/06/2019   HCT 37.9 05/06/2019   PLT 195 05/06/2019   LYMPHOPCT 4 05/04/2019   MONOPCT 2 05/04/2019   EOSPCT 0 05/04/2019   BASOPCT 0 05/04/2019    CMP:  Lab Results  Component Value Date   NA 142 05/07/2019   K 3.8 05/07/2019   CL 111 05/07/2019  CO2 25 05/07/2019   BUN 11 05/07/2019   CREATININE 0.85 05/07/2019   PROT 8.2 (H) 05/04/2019   ALBUMIN 3.9 05/04/2019   BILITOT 1.8 (H) 05/04/2019   ALKPHOS 110 05/04/2019   AST 24 05/04/2019   ALT 23 05/04/2019  .   Total Time in preparing paper work, data evaluation and todays exam - 77 minutes  Epifanio Lesches M.D on 05/07/2019 at 1:58 PM     Note: This dictation was prepared with Dragon dictation along with smaller phrase technology. Any transcriptional errors that result from this process are unintentional.

## 2020-01-27 IMAGING — CR CHEST - 2 VIEW
2 series · 3 of 3 positions shown · non-contrast
Comparison: 04/14/2008 report

CLINICAL DATA: Hyperglycemia

EXAM:
CHEST - 2 VIEW

[Series 1: chest pa · 0.14mm/px · 2 of 2 slices shown]
[im 1/2]
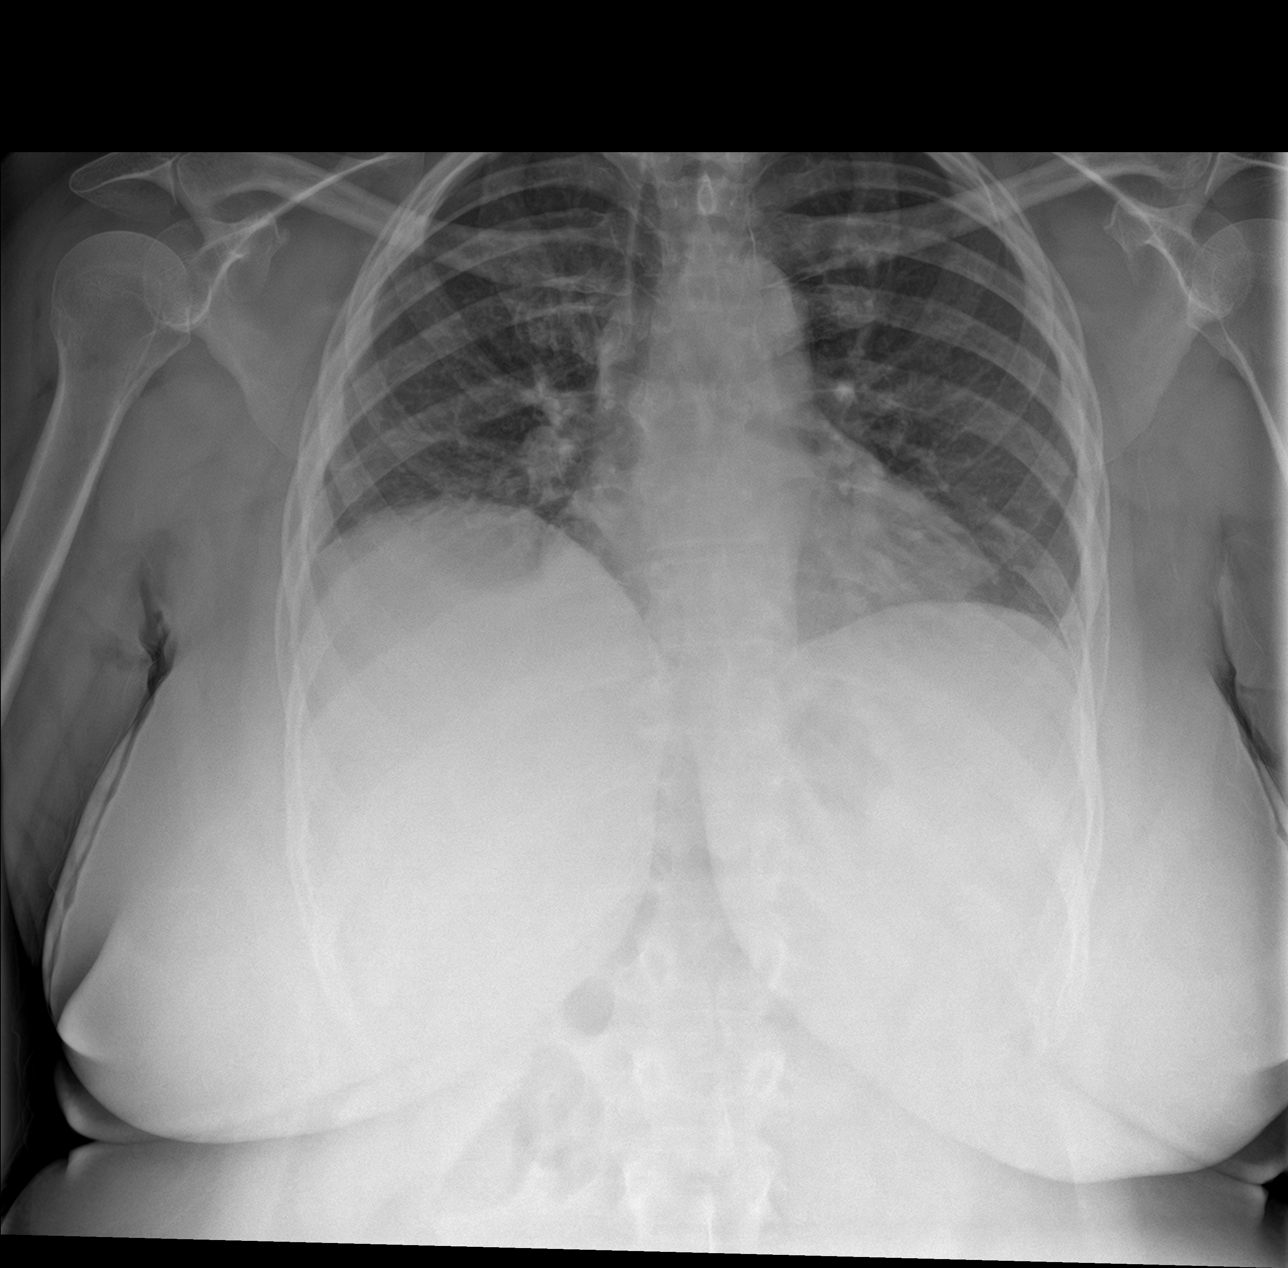
[im 2/2]
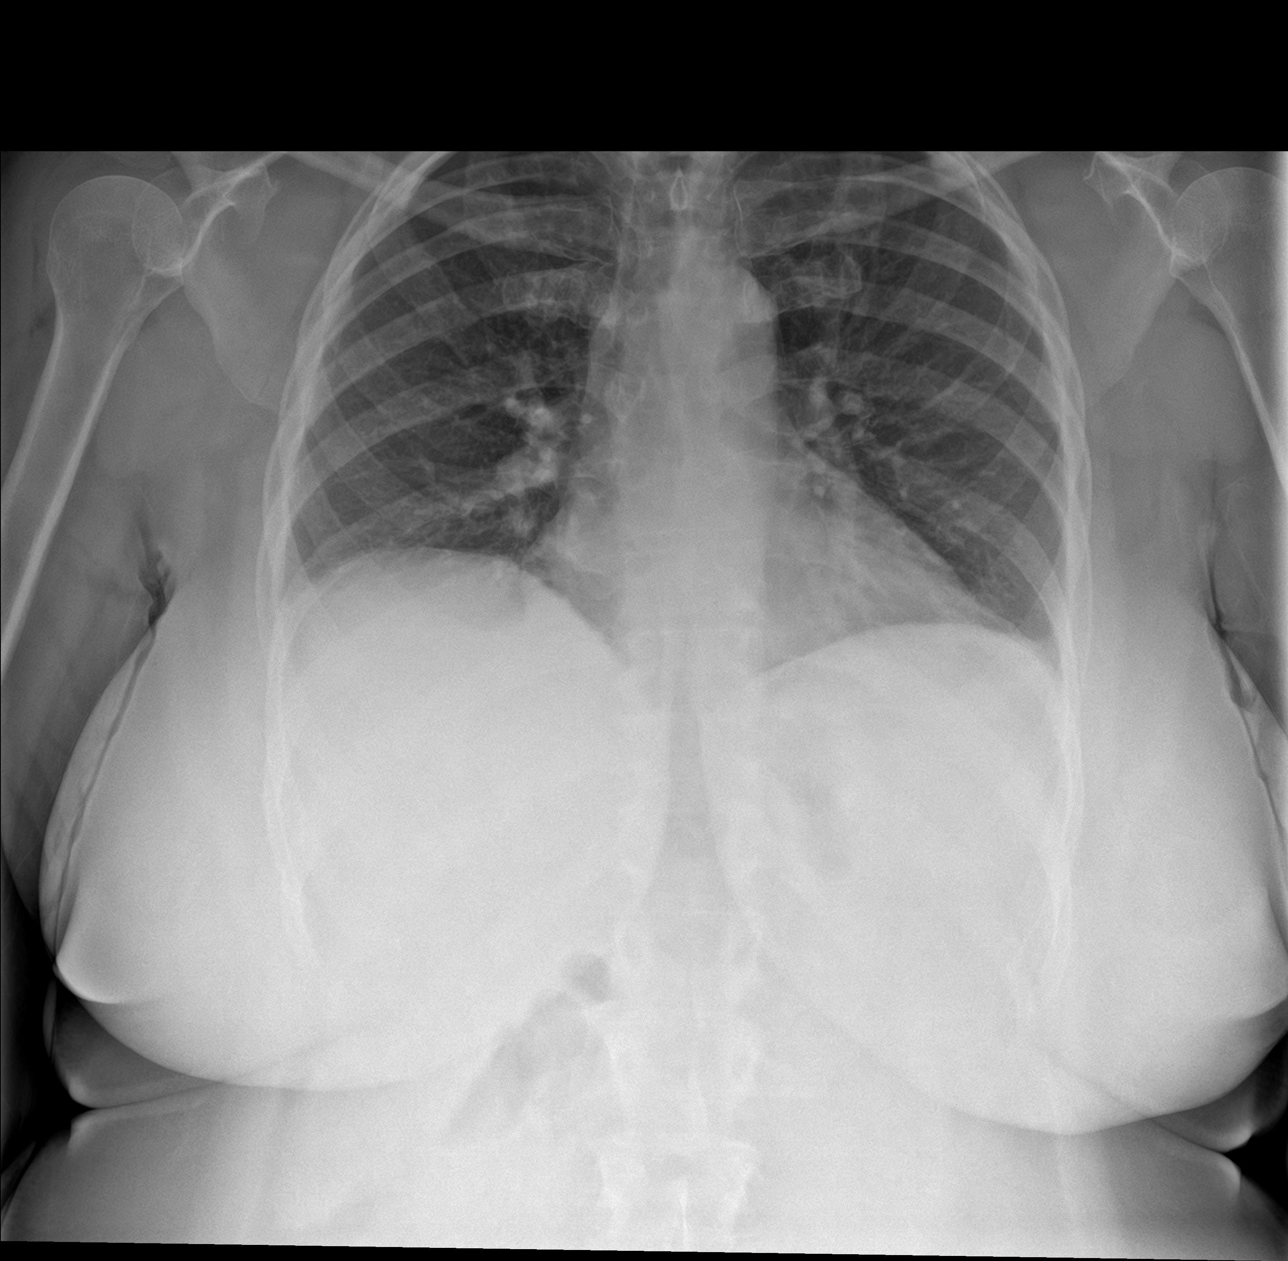

[chest lat]
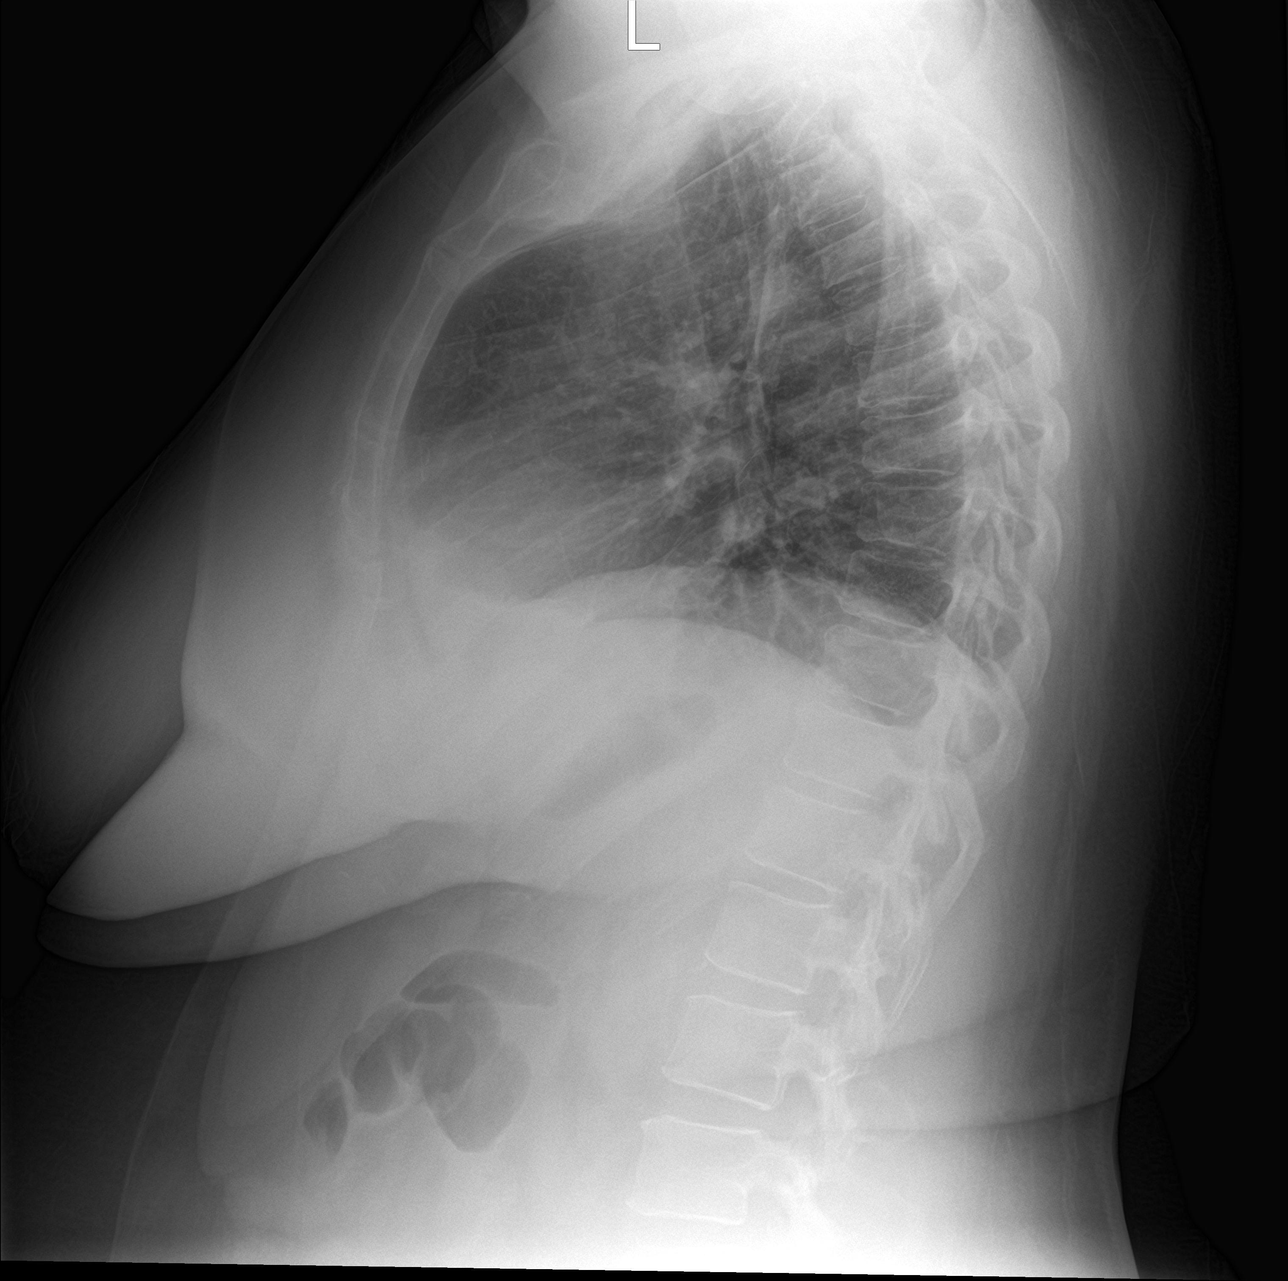

[3 of 3 positions shown; findings below may reference images not displayed]

FINDINGS: Low lung volumes. No consolidation or effusion. Normal heart size.
No pneumothorax.
IMPRESSION: No active cardiopulmonary disease.

## 2020-01-27 IMAGING — US RIGHT LOWER EXTREMITY VENOUS ULTRASOUND
1 series · 13 of 24 positions shown · non-contrast
Comparison: None.

CLINICAL DATA: Right lower extremity swelling



[Series 1: right lower extremity venous ultrasound · 0.07mm/px · 13 of 34 slices shown]
[im 1/34]
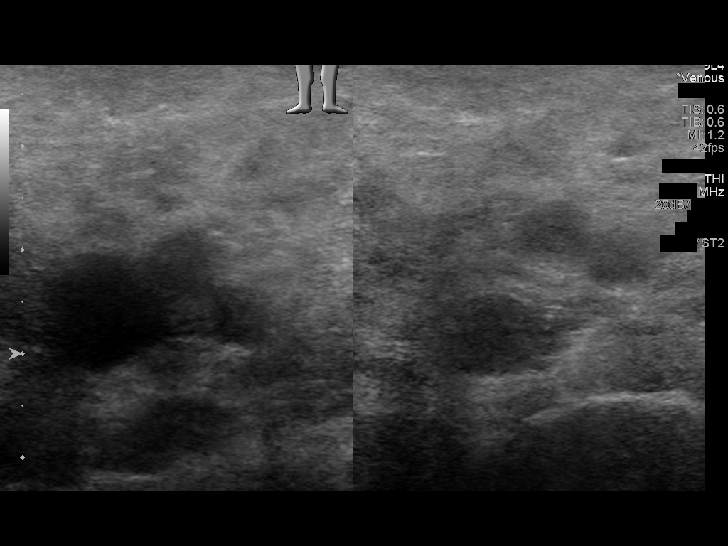
[im 3/34]
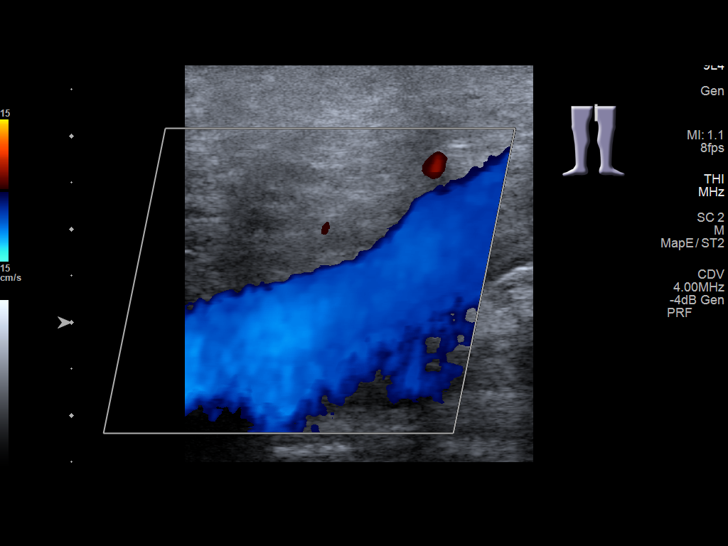
[im 6/34]
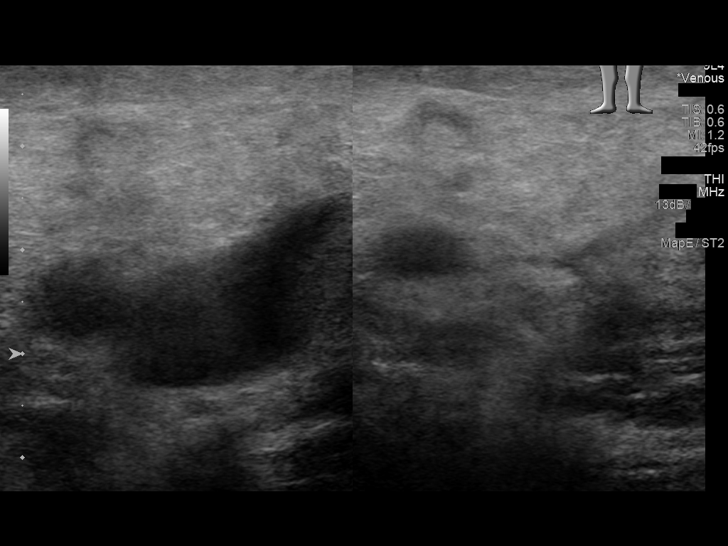
[im 9/34]
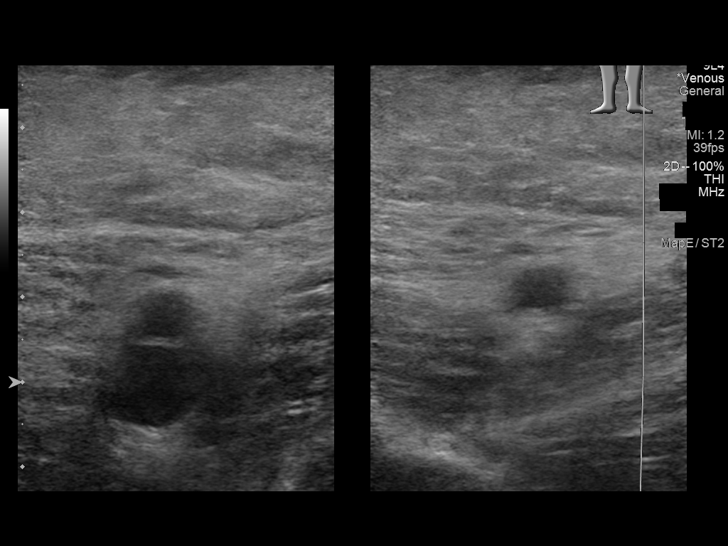
[im 12/34]
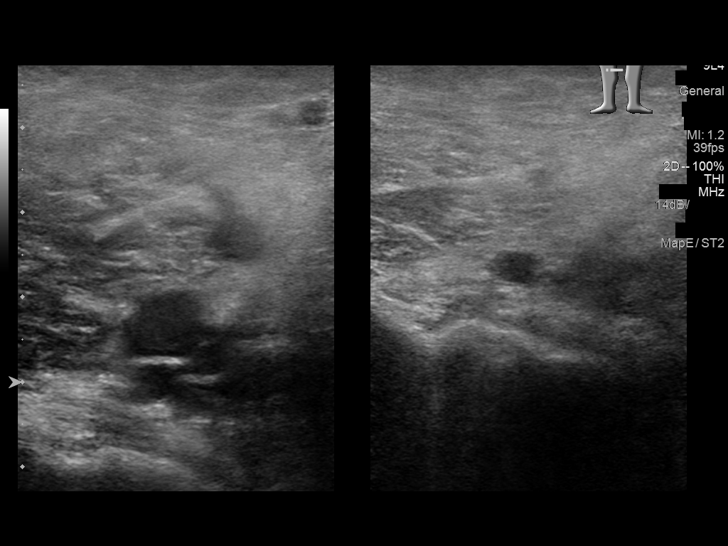
[im 15/34]
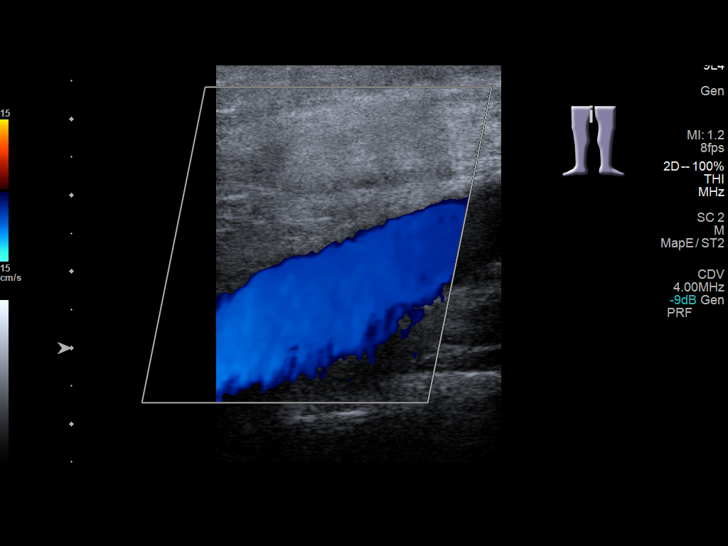
[im 18/34]
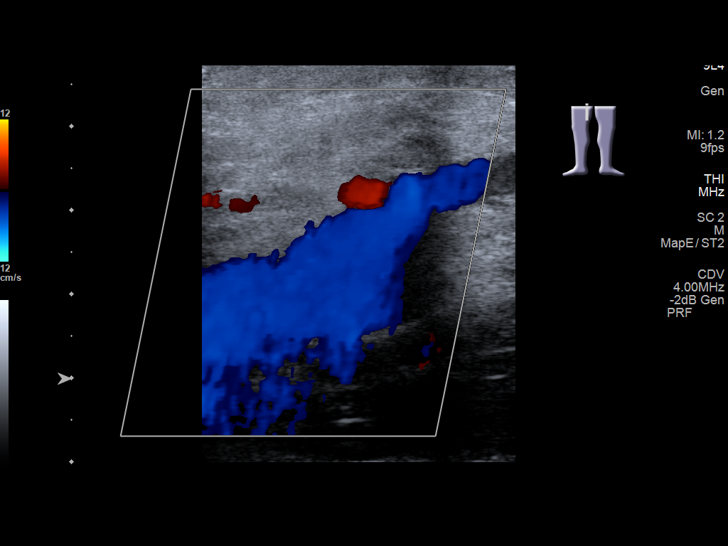
[im 19/34]
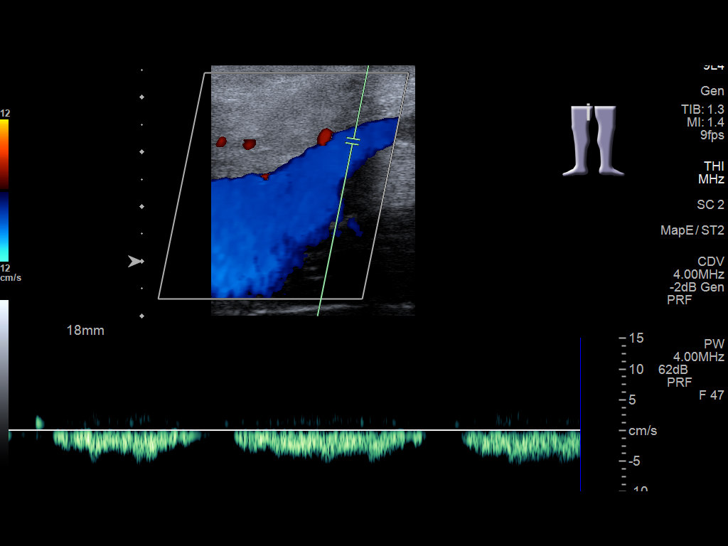
[im 22/34]
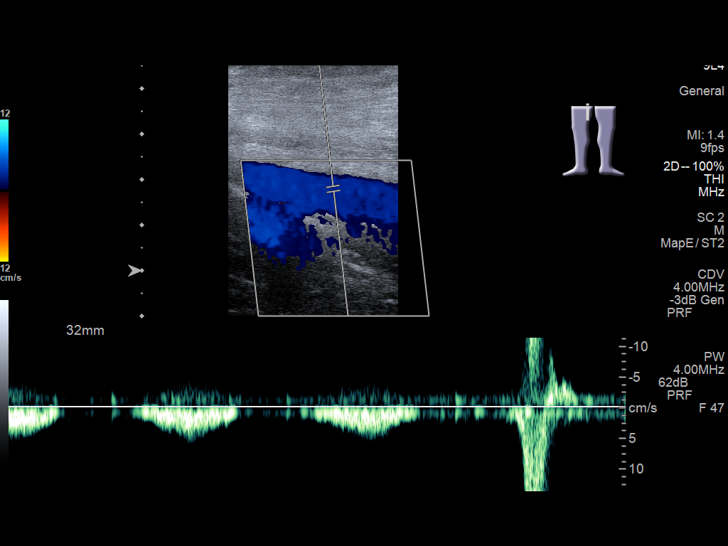
[im 25/34]
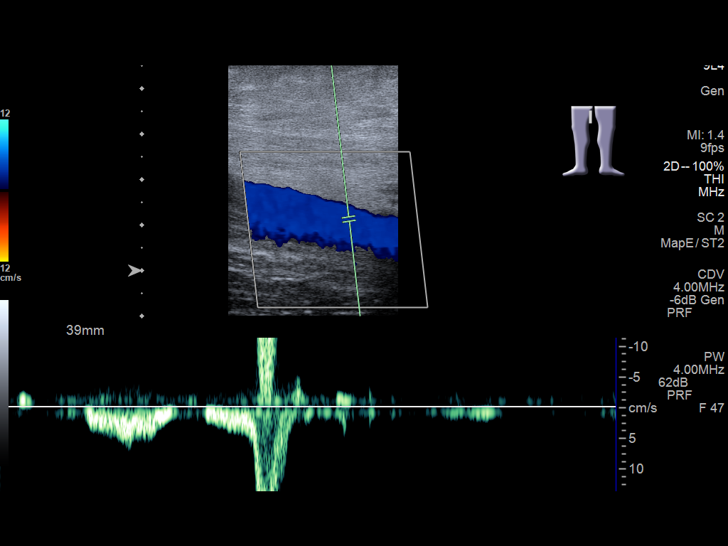
[im 28/34]
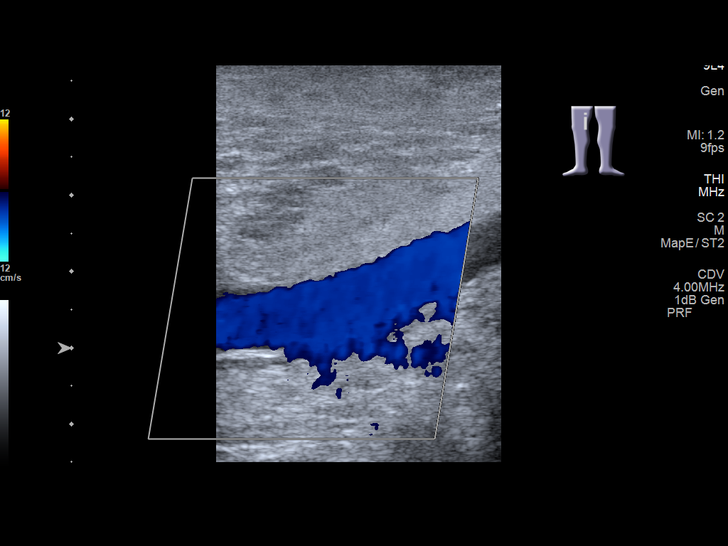
[im 31/34]
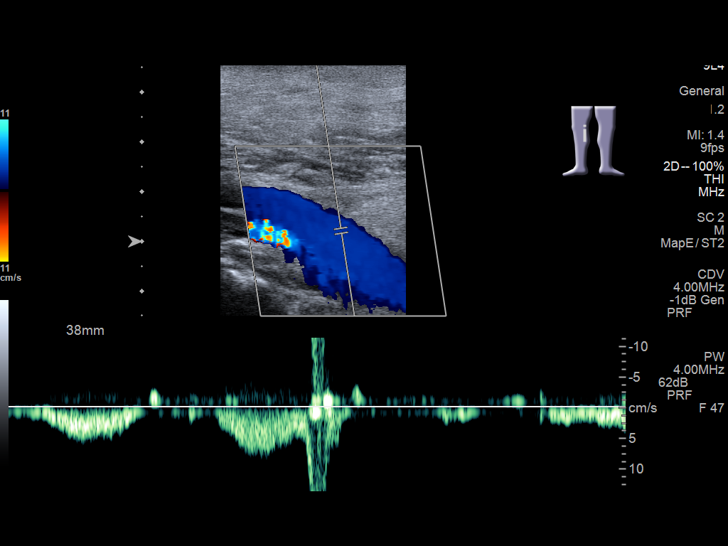
[im 34/34]
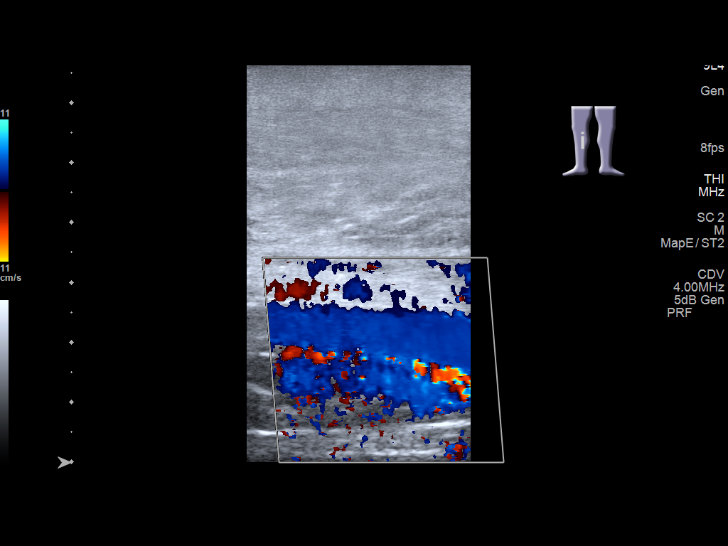

[13 of 24 positions shown; findings below may reference images not displayed]

FINDINGS: Contralateral Common Femoral Vein: Respiratory phasicity is normal
and symmetric with the symptomatic side. No evidence of thrombus.
Normal compressibility.

Common Femoral Vein: No evidence of thrombus. Normal
compressibility, respiratory phasicity and response to augmentation.

Saphenofemoral Junction: No evidence of thrombus. Normal
compressibility and flow on color Doppler imaging.

Profunda Femoral Vein: No evidence of thrombus. Normal
compressibility and flow on color Doppler imaging.

Femoral Vein: No evidence of thrombus. Normal compressibility,
respiratory phasicity and response to augmentation.

Popliteal Vein: No evidence of thrombus. Normal compressibility,
respiratory phasicity and response to augmentation.

Calf Veins: No evidence of thrombus. Normal compressibility and flow
on color Doppler imaging.

Superficial Great Saphenous Vein: No evidence of thrombus. Normal
compressibility.

Venous Reflux:  None.

Other Findings:  None.
IMPRESSION: No evidence of deep venous thrombosis.

## 2020-04-29 ENCOUNTER — Telehealth: Payer: Self-pay

## 2020-04-29 NOTE — Telephone Encounter (Signed)
Called lmom informing patient of appointment on 05/03/2020. klh

## 2020-05-03 ENCOUNTER — Telehealth: Payer: Self-pay

## 2020-05-03 ENCOUNTER — Encounter: Payer: Self-pay | Admitting: Adult Health

## 2020-05-03 ENCOUNTER — Other Ambulatory Visit: Payer: Self-pay

## 2020-05-03 ENCOUNTER — Ambulatory Visit: Payer: BC Managed Care – PPO | Admitting: Adult Health

## 2020-05-03 VITALS — BP 152/84 | HR 76 | Temp 97.5°F | Resp 16 | Ht 64.0 in | Wt 223.0 lb

## 2020-05-03 DIAGNOSIS — E1165 Type 2 diabetes mellitus with hyperglycemia: Secondary | ICD-10-CM

## 2020-05-03 DIAGNOSIS — I495 Sick sinus syndrome: Secondary | ICD-10-CM

## 2020-05-03 DIAGNOSIS — B373 Candidiasis of vulva and vagina: Secondary | ICD-10-CM | POA: Diagnosis not present

## 2020-05-03 DIAGNOSIS — I10 Essential (primary) hypertension: Secondary | ICD-10-CM

## 2020-05-03 DIAGNOSIS — B3731 Acute candidiasis of vulva and vagina: Secondary | ICD-10-CM

## 2020-05-03 DIAGNOSIS — R300 Dysuria: Secondary | ICD-10-CM | POA: Diagnosis not present

## 2020-05-03 LAB — POCT GLYCOSYLATED HEMOGLOBIN (HGB A1C): Hemoglobin A1C: 12.4 % — AB (ref 4.0–5.6)

## 2020-05-03 LAB — POCT URINALYSIS DIPSTICK
Glucose, UA: POSITIVE — AB
Nitrite, UA: NEGATIVE
Protein, UA: NEGATIVE
Spec Grav, UA: 1.015 (ref 1.010–1.025)
Urobilinogen, UA: 0.2 E.U./dL
pH, UA: 5 (ref 5.0–8.0)

## 2020-05-03 MED ORDER — FLUCONAZOLE 150 MG PO TABS: 150.0000 mg | ORAL_TABLET | ORAL | 0 refills | Status: DC | PRN

## 2020-05-03 MED ORDER — AMLODIPINE BESYLATE 5 MG PO TABS: 5.0000 mg | ORAL_TABLET | Freq: Every day | ORAL | 1 refills | Status: DC

## 2020-05-03 MED ORDER — INSULIN DETEMIR 100 UNIT/ML ~~LOC~~ SOLN: 15.0000 [IU] | Freq: Every day | SUBCUTANEOUS | 2 refills | Status: DC

## 2020-05-03 NOTE — Telephone Encounter (Signed)
Confirmed and screened for 05-03-20 ov. 

## 2020-05-03 NOTE — Progress Notes (Signed)
[facility] [place], [place] [address]  Internal MEDICINE  Office Visit Note  Patient Name: Tammy Beasley  [id number]  [id number]  Date of Service: 05/03/2020   Complaints/HPI Pt is here for establishment of PCP. Chief Complaint  Patient presents with  . New Patient (Initial Visit)  . Asthma  . Hypertension  . Diabetes   HPI Pt is here to establish care.  She is a well appearing 49 yo female.  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 Her previous PCP was in [facility]. She has a history of DM, HTN, and Asthma.  She currently only takes Novolog Flexpen on Sliding scale. She has been on Basaglar in the past. Her A1C today is 12.4.   She was previously on lisinopril for her bp.  Her husband had a serious Angioedema from lisinopril so she stopped taking it our of fear.   Current sliding scale: 70-120 0 units, 121-150 3 units 151-200 4 units 201-250 7 units 251-300 11 units 301-350 15 units 351-400 20 units  Current Medication: Outpatient Encounter Medications as of 05/03/2020  Medication Sig  . insulin aspart (NOVOLOG FLEXPEN) 100 UNIT/ML FlexPen Freq: 3 times daily with meals Route: [place] Start: 05/05/19 0800  Admin Instructions: Do NOT hold if patient is NPO. Resistant Scale.  Order specific questions: Correction coverage: Resistant (obese, steroids) CBG < 70: Implement Hypoglycemia Standing Orders and refer to Hypoglycemia Standing Orders sidebar report CBG 70 - 120: 0 units CBG 121 - 150: 3 units CBG 151 - 200: 4 units CBG 201 - 250: 7 units CBG 251 - 300: 11 units CBG 301 - 350: 15 units CBG 351 - 400: 20 units  . [DISCONTINUED] doxycycline (VIBRA-TABS) 100 MG tablet Take 1 tablet (100 mg total) by mouth every 12 (twelve) hours.  . [DISCONTINUED] fluconazole (DIFLUCAN) 150 MG tablet Take 1 tablet (150 mg total) by mouth daily. Take 1 pill now 1 in 3 days and 1 when you finish the antibiotics.  . [DISCONTINUED]  Insulin Glargine (LANTUS SOLOSTAR) 100 UNIT/ML Solostar Pen Inject 10 Units into the skin daily.  . [DISCONTINUED] lisinopril (ZESTRIL) 10 MG tablet Take 10 mg by mouth daily.  . [DISCONTINUED] pioglitazone (ACTOS) 30 MG tablet Take 1 tablet (30 mg total) by mouth daily.   No facility-administered encounter medications on file as of 05/03/2020.    Surgical History: Past Surgical History:  Procedure Laterality Date  . BREAST LUMPECTOMY    . celluilitus      Medical History: Past Medical History:  Diagnosis Date  . Diabetes mellitus without complication ([place])   . Hypertension     Family History: Family History  Problem Relation Age of Onset  . Renal Disease Father   . Kidney failure Father   . Diabetes Father     Social History   Socioeconomic History  . Marital status: Single    Spouse name: Not on file  . Number of children: Not on file  . Years of education: Not on file  . Highest education level: Not on file  Occupational History  . Not on file  Tobacco Use  . Smoking status: Current Every Day Smoker    Types: Cigarettes  . Smokeless tobacco: Never Used  Substance and Sexual Activity  . Alcohol use: Not Currently  . Drug use: Never  . Sexual activity: Not on file  Other Topics Concern  . Not on file  Social History Narrative  . Not on file  Social Determinants of Health   Financial Resource Strain:   . Difficulty of Paying Living Expenses:   Food Insecurity:   . Worried About Charity fundraiser in the Last Year:   . Arboriculturist in the Last Year:   Transportation Needs:   . Film/video editor (Medical):   Marland Kitchen Lack of Transportation (Non-Medical):   Physical Activity:   . Days of Exercise per Week:   . Minutes of Exercise per Session:   Stress:   . Feeling of Stress :   Social Connections:   . Frequency of Communication with Friends and Family:   . Frequency of Social Gatherings with Friends and Family:   . Attends Religious Services:   .  Active Member of Clubs or Organizations:   . Attends Archivist Meetings:   Marland Kitchen Marital Status:   Intimate Partner Violence:   . Fear of Current or Ex-Partner:   . Emotionally Abused:   Marland Kitchen Physically Abused:   . Sexually Abused:      Review of Systems  Constitutional: Negative for chills, fatigue and unexpected weight change.  HENT: Negative for congestion, rhinorrhea, sneezing and sore throat.   Eyes: Negative for photophobia, pain and redness.  Respiratory: Negative for cough, chest tightness and shortness of breath.   Cardiovascular: Negative for chest pain and palpitations.  Gastrointestinal: Negative for abdominal pain, constipation, diarrhea, nausea and vomiting.  Endocrine: Negative.   Genitourinary: Negative for dysuria and frequency.  Musculoskeletal: Negative for arthralgias, back pain, joint swelling and neck pain.  Skin: Negative for rash.  Allergic/Immunologic: Negative.   Neurological: Negative for tremors and numbness.  Hematological: Negative for adenopathy. Does not bruise/bleed easily.  Psychiatric/Behavioral: Negative for behavioral problems and sleep disturbance. The patient is not nervous/anxious.     Vital Signs: BP (!) 152/84   Pulse 76   Temp (!) 97.5 F (36.4 C)   Resp 16   Ht 5\' 4"  (1.626 m)   Wt 223 lb (101.2 kg)   SpO2 98%   BMI 38.28 kg/m    Physical Exam Vitals and nursing note reviewed.  Constitutional:      General: She is not in acute distress.    Appearance: She is well-developed. She is not diaphoretic.  HENT:     Head: Normocephalic and atraumatic.     Mouth/Throat:     Pharynx: No oropharyngeal exudate.  Eyes:     Pupils: Pupils are equal, round, and reactive to light.  Neck:     Thyroid: No thyromegaly.     Vascular: No JVD.     Trachea: No tracheal deviation.  Cardiovascular:     Rate and Rhythm: Normal rate and regular rhythm.     Heart sounds: Normal heart sounds. No murmur heard.  No friction rub. No gallop.    Pulmonary:     Effort: Pulmonary effort is normal. No respiratory distress.     Breath sounds: Normal breath sounds. No wheezing or rales.  Chest:     Chest wall: No tenderness.  Abdominal:     Palpations: Abdomen is soft.     Tenderness: There is no abdominal tenderness. There is no guarding.  Musculoskeletal:        General: Normal range of motion.     Cervical back: Normal range of motion and neck supple.  Lymphadenopathy:     Cervical: No cervical adenopathy.  Skin:    General: Skin is warm and dry.  Neurological:     Mental  Status: She is alert and oriented to person, place, and time.     Cranial Nerves: No cranial nerve deficit.  Psychiatric:        Behavior: Behavior normal.        Thought Content: Thought content normal.        Judgment: Judgment normal.    Assessment/Plan: 1. Type 2 diabetes mellitus with hyperglycemia, unspecified whether long term insulin use (HCC) Blood sugar 180mg /dl patient will increase Levemir by 2 units daily for a max of 30 units.  Will return in 2 weeks with sugar log.  - POCT HgB A1C - insulin detemir (LEVEMIR) 100 UNIT/ML injection; Inject 0.15 mLs (15 Units total) into the skin daily.  Dispense: 10 mL; Refill: 2  2. Vaginal candida Use fluconazole as directed.  - fluconazole (DIFLUCAN) 150 MG tablet; Take 1 tablet (150 mg total) by mouth every three (3) days as needed.  Dispense: 3 tablet; Refill: 0  3. Essential hypertension Start norvasc nightly and follow up as discussed.  - amLODipine (NORVASC) 5 MG tablet; Take 1 tablet (5 mg total) by mouth daily.  Dispense: 30 tablet; Refill: 1  General Counseling: Leilani verbalizes understanding of the findings of todays visit and agrees with plan of treatment. I have discussed any further diagnostic evaluation that may be needed or ordered today. We also reviewed her medications today. she has been encouraged to call the office with any questions or concerns that should arise related to todays  visit.  Orders Placed This Encounter  Procedures  . POCT HgB A1C    No orders of the defined types were placed in this encounter.   Time spent: 30 Minutes   This patient was seen by Orson Gear AGNP-C in Collaboration with Dr Lavera Guise as a part of collaborative care agreement  Kendell Bane AGNP-C Internal Medicine

## 2020-05-04 ENCOUNTER — Other Ambulatory Visit: Payer: Self-pay

## 2020-05-04 MED ORDER — LEVEMIR FLEXTOUCH 100 UNIT/ML ~~LOC~~ SOPN: 15.0000 [IU] | PEN_INJECTOR | Freq: Every day | SUBCUTANEOUS | 2 refills | Status: DC

## 2020-05-09 NOTE — Progress Notes (Signed)
Please review

## 2020-05-10 LAB — CULTURE, URINE COMPREHENSIVE

## 2020-05-17 ENCOUNTER — Telehealth: Payer: Self-pay

## 2020-05-17 ENCOUNTER — Other Ambulatory Visit: Payer: Self-pay

## 2020-05-17 ENCOUNTER — Ambulatory Visit: Payer: BC Managed Care – PPO | Admitting: Adult Health

## 2020-05-17 MED ORDER — NITROFURANTOIN MONOHYD MACRO 100 MG PO CAPS: 100.0000 mg | ORAL_CAPSULE | Freq: Two times a day (BID) | ORAL | 0 refills | Status: DC

## 2020-05-17 NOTE — Telephone Encounter (Signed)
Confirmed office visit on 8/19

## 2020-05-17 NOTE — Telephone Encounter (Signed)
lmom to call us back

## 2020-05-19 ENCOUNTER — Ambulatory Visit: Payer: BC Managed Care – PPO | Admitting: Hospice and Palliative Medicine

## 2020-05-24 ENCOUNTER — Telehealth: Payer: Self-pay

## 2020-05-24 NOTE — Telephone Encounter (Signed)
LMOM for office visit on 8/26

## 2020-05-26 ENCOUNTER — Ambulatory Visit: Payer: BC Managed Care – PPO | Admitting: Hospice and Palliative Medicine

## 2020-05-30 ENCOUNTER — Telehealth: Payer: Self-pay

## 2020-05-30 NOTE — Telephone Encounter (Signed)
Billed missed appt fee 05/26/20

## 2020-11-24 ENCOUNTER — Encounter: Payer: Self-pay | Admitting: Hospice and Palliative Medicine

## 2020-11-24 ENCOUNTER — Ambulatory Visit: Payer: BC Managed Care – PPO | Admitting: Internal Medicine

## 2020-11-24 VITALS — BP 130/90 | HR 75 | Temp 97.5°F | Resp 16 | Ht 64.0 in | Wt 217.4 lb

## 2020-11-24 DIAGNOSIS — E782 Mixed hyperlipidemia: Secondary | ICD-10-CM

## 2020-11-24 DIAGNOSIS — Z6837 Body mass index (BMI) 37.0-37.9, adult: Secondary | ICD-10-CM

## 2020-11-24 DIAGNOSIS — I10 Essential (primary) hypertension: Secondary | ICD-10-CM

## 2020-11-24 DIAGNOSIS — E1165 Type 2 diabetes mellitus with hyperglycemia: Secondary | ICD-10-CM | POA: Diagnosis not present

## 2020-11-24 DIAGNOSIS — Z794 Long term (current) use of insulin: Secondary | ICD-10-CM | POA: Diagnosis not present

## 2020-11-24 LAB — POCT GLYCOSYLATED HEMOGLOBIN (HGB A1C): Hemoglobin A1C: 11.9 % — AB (ref 4.0–5.6)

## 2020-11-24 NOTE — Progress Notes (Signed)
Gave patient Accu-Chek Guide me machine and instructions on how to use to check her blood sugars once a day per DFK.  Also gave pt instructions on how to use trulicity and then I watched while patient injected herself with first dose of trulicity 1.69.  Pt was given one sample of the trulicity 6.78 and one sample of the 1.5, also samples of farxiga

## 2020-11-24 NOTE — Progress Notes (Unsigned)
[facility] [place], [place] [address]  Internal MEDICINE  Office Visit Note  Patient Name: Tammy Beasley  [id number]  [id number]  Date of Service: 11/25/2020  Chief Complaint  Patient presents with  . Follow-up    Med follow up, sugar readings are not good  . Diabetes  . Hypertension  . Medication Refill  . Quality Metric Gaps    Mammogram, pap smear, colonoscopy,flu, covid    HPI Pt is here for routine follow up. She has not been seen in the office since August. She does not like to be on insulin and has been missing her dose. She is still using periodically sliding scale  Pt is also non adherent to her appointments as well, she has not had a mamm and Colonoscopy She is however willing to take fresh start and take better care of herself  Denies any c/p or sob   Current Medication: Outpatient Encounter Medications as of 11/24/2020  Medication Sig  . amLODipine (NORVASC) 5 MG tablet Take 1 tablet (5 mg total) by mouth daily.  . [DISCONTINUED] Accu-Chek Softclix Lancets lancets Use as instructed  . [DISCONTINUED] glucose blood (ACCU-CHEK GUIDE) test strip Use as instructed  . [DISCONTINUED] insulin aspart (NOVOLOG FLEXPEN) 100 UNIT/ML FlexPen Freq: 3 times daily with meals Route: [place] Start: 05/05/19 0800  Admin Instructions: Do NOT hold if patient is NPO. Resistant Scale.  Order specific questions: Correction coverage: Resistant (obese, steroids) CBG < 70: Implement Hypoglycemia Standing Orders and refer to Hypoglycemia Standing Orders sidebar report CBG 70 - 120: 0 units CBG 121 - 150: 3 units CBG 151 - 200: 4 units CBG 201 - 250: 7 units CBG 251 - 300: 11 units CBG 301 - 350: 15 units CBG 351 - 400: 20 units  . [DISCONTINUED] insulin detemir (LEVEMIR FLEXTOUCH) 100 UNIT/ML FlexPen Inject 15 Units into the skin daily.  . [DISCONTINUED] insulin detemir (LEVEMIR) 100 UNIT/ML injection Inject 0.15 mLs (15 Units total) into the skin daily.  .  Accu-Chek Softclix Lancets lancets Use as instructed to check blood sugars twice a day  E11.65  . glucose blood (ACCU-CHEK GUIDE) test strip Use as directed to check blood sugars twice a day  E11.65  . [DISCONTINUED] fluconazole (DIFLUCAN) 150 MG tablet Take 1 tablet (150 mg total) by mouth every three (3) days as needed.  . [DISCONTINUED] nitrofurantoin, macrocrystal-monohydrate, (MACROBID) 100 MG capsule Take 1 capsule (100 mg total) by mouth 2 (two) times daily.   No facility-administered encounter medications on file as of 11/24/2020.    Surgical History: Past Surgical History:  Procedure Laterality Date  . BREAST LUMPECTOMY    . celluilitus      Medical History: Past Medical History:  Diagnosis Date  . Diabetes mellitus without complication ([place])   . Hypertension     Family History: Family History  Problem Relation Age of Onset  . Renal Disease Father   . Kidney failure Father   . Diabetes Father     Social History   Socioeconomic History  . Marital status: Single    Spouse name: Not on file  . Number of children: Not on file  . Years of education: Not on file  . Highest education level: Not on file  Occupational History  . Not on file  Tobacco Use  . Smoking status: Current Every Day Smoker    Types: Cigarettes  . Smokeless tobacco: Never Used  . Tobacco comment: trying to quit  Substance and Sexual Activity  .  Alcohol use: Not Currently  . Drug use: Never  . Sexual activity: Not on file  Other Topics Concern  . Not on file  Social History Narrative  . Not on file   Social Determinants of Health   Financial Resource Strain: Not on file  Food Insecurity: Not on file  Transportation Needs: Not on file  Physical Activity: Not on file  Stress: Not on file  Social Connections: Not on file  Intimate Partner Violence: Not on file      Review of Systems  Constitutional: Negative for chills, diaphoresis and fatigue.  HENT: Negative for ear pain,  postnasal drip and sinus pressure.   Eyes: Negative for photophobia, discharge, redness, itching and visual disturbance.  Respiratory: Negative for cough, shortness of breath and wheezing.   Cardiovascular: Negative for chest pain, palpitations and leg swelling.  Gastrointestinal: Negative for abdominal pain, constipation, diarrhea, nausea and vomiting.  Genitourinary: Negative for dysuria and flank pain.  Musculoskeletal: Negative for arthralgias, back pain, gait problem and neck pain.  Skin: Negative for color change.  Allergic/Immunologic: Negative for environmental allergies and food allergies.  Neurological: Negative for dizziness and headaches.  Hematological: Does not bruise/bleed easily.  Psychiatric/Behavioral: Negative for agitation, behavioral problems (depression) and hallucinations.    Vital Signs: BP 130/90   Pulse 75   Temp (!) 97.5 F (36.4 C)   Resp 16   Ht 5\' 4"  (1.626 m)   Wt 217 lb 6.4 oz (98.6 kg)   SpO2 98%   BMI 37.32 kg/m    Physical Exam Constitutional:      Appearance: Normal appearance.  HENT:     Head: Normocephalic and atraumatic.     Mouth/Throat:     Mouth: Mucous membranes are dry.  Eyes:     Pupils: Pupils are equal, round, and reactive to light.  Cardiovascular:     Rate and Rhythm: Normal rate and regular rhythm.     Heart sounds: Normal heart sounds. No murmur heard.   Pulmonary:     Effort: Pulmonary effort is normal.     Breath sounds: Normal breath sounds. No rhonchi.  Abdominal:     General: Abdomen is flat.  Musculoskeletal:        General: Normal range of motion.     Cervical back: Normal range of motion.  Skin:    General: Skin is warm and dry.  Neurological:     General: No focal deficit present.     Mental Status: She is alert and oriented to person, place, and time. Mental status is at baseline.     Cranial Nerves: No cranial nerve deficit.  Psychiatric:        Mood and Affect: Mood normal.        Behavior:  Behavior normal.        Thought Content: Thought content normal.        Assessment/Plan:  1. Type 2 diabetes mellitus with hyperglycemia, with long-term current use of insulin (HCC) Uncontrolled diabetes with hg a1c of 11.9 today, discussed CV risks for having such high numbers  Will DC insulin ( Levemir). Use sliding scale with NOVOLOG ( use as directed ) 2 x aday only Samples of Trulicity 1.61 mg q week x2 weeks and then increase to 1.5 week 3 and 4. Added Farxiga 5 mg x 1 weeks and then increase to 10 mg there after.  - POCT HgB A1C - glucose blood (ACCU-CHEK GUIDE) test strip; Use as directed to check blood sugars twice  a day  E11.65  Dispense: 100 each; Refill: 12 - Accu-Chek Softclix Lancets lancets; Use as instructed to check blood sugars twice a day  E11.65  Dispense: 100 each; Refill: 12 Diabetes Counseling:  1. Addition of ACE inh/ ARB'S for nephroprotection. Microalbumin  Will be  updated  2. Diabetic foot care, prevention of complications. Podiatry consult 3. Exercise and lose weight.  4. Diabetic eye examination, Diabetic eye exam is updated  5. Monitor blood sugar closlely. nutrition counseling.  6. Sign and symptoms of hypoglycemia including shaking sweating,confusion and headaches.  2. Essential hypertension, benign Continue Norvasc as before   3. Mixed hyperlipidemia Will need a statin for CV risk prevention   4. BMI 37.0-37.9, adult Obesity Counseling: Risk Assessment: An assessment of behavioral risk factors was made today and includes lack of exercise sedentary lifestyle, lack of portion control and poor dietary habits.  Risk Modification Advice: She was counseled on portion control guidelines. Restricting daily caloric intake to 1500 ADA  The detrimental long term effects of obesity on her health and ongoing poor compliance was also discussed with the patient.    General Counseling: geriann lafont understanding of the findings of todays visit and agrees  with plan of treatment. I have discussed any further diagnostic evaluation that may be needed or ordered today. We also reviewed her medications today. she has been encouraged to call the office with any questions or concerns that should arise related to todays visit.    Orders Placed This Encounter  Procedures  . CBC with Differential/Platelet  . Lipid Panel With LDL/HDL Ratio  . TSH  . T4, free  . Comprehensive metabolic panel  . Urinalysis  . POCT HgB A1C    Meds ordered this encounter  Medications  . DISCONTD: Accu-Chek Softclix Lancets lancets    Sig: Use as instructed    Dispense:  100 each    Refill:  12  . DISCONTD: glucose blood (ACCU-CHEK GUIDE) test strip    Sig: Use as instructed    Dispense:  100 each    Refill:  12  . glucose blood (ACCU-CHEK GUIDE) test strip    Sig: Use as directed to check blood sugars twice a day  E11.65    Dispense:  100 each    Refill:  12  . Accu-Chek Softclix Lancets lancets    Sig: Use as instructed to check blood sugars twice a day  E11.65    Dispense:  100 each    Refill:  12    Total time spent 35  Minutes Time spent includes review of chart, medications, test results, and follow up plan with the patient.   [place] Controlled Substance Database was reviewed by me.   Dr Lavera Guise Internal medicine

## 2020-11-25 MED ORDER — ACCU-CHEK GUIDE VI STRP: ORAL_STRIP | 12 refills | Status: DC

## 2020-11-25 MED ORDER — ACCU-CHEK SOFTCLIX LANCETS MISC: 12 refills | Status: DC

## 2020-11-26 LAB — TSH: TSH: 1.82 u[IU]/mL (ref 0.450–4.500)

## 2020-11-26 LAB — COMPREHENSIVE METABOLIC PANEL
ALT: 17 IU/L (ref 0–32)
AST: 12 IU/L (ref 0–40)
Albumin/Globulin Ratio: 1.2 (ref 1.2–2.2)
Albumin: 4 g/dL (ref 3.8–4.8)
Alkaline Phosphatase: 142 IU/L — ABNORMAL HIGH (ref 44–121)
BUN/Creatinine Ratio: 13 (ref 9–23)
BUN: 11 mg/dL (ref 6–24)
Bilirubin Total: 0.6 mg/dL (ref 0.0–1.2)
CO2: 23 mmol/L (ref 20–29)
Calcium: 9.8 mg/dL (ref 8.7–10.2)
Chloride: 102 mmol/L (ref 96–106)
Creatinine, Ser: 0.82 mg/dL (ref 0.57–1.00)
GFR calc Af Amer: 96 mL/min/{1.73_m2} (ref >59)
GFR calc non Af Amer: 84 mL/min/{1.73_m2} (ref >59)
Globulin, Total: 3.3 g/dL (ref 1.5–4.5)
Glucose: 163 mg/dL — ABNORMAL HIGH (ref 65–99)
Potassium: 4.6 mmol/L (ref 3.5–5.2)
Sodium: 139 mmol/L (ref 134–144)
Total Protein: 7.3 g/dL (ref 6.0–8.5)

## 2020-11-26 LAB — CBC WITH DIFFERENTIAL/PLATELET
Basophils Absolute: 0 10*3/uL (ref 0.0–0.2)
Basos: 0 %
EOS (ABSOLUTE): 0.2 10*3/uL (ref 0.0–0.4)
Eos: 2 %
Hematocrit: 44.7 % (ref 34.0–46.6)
Hemoglobin: 14.6 g/dL (ref 11.1–15.9)
Immature Grans (Abs): 0 10*3/uL (ref 0.0–0.1)
Immature Granulocytes: 0 %
Lymphocytes Absolute: 2.9 10*3/uL (ref 0.7–3.1)
Lymphs: 40 %
MCH: 26.5 pg — ABNORMAL LOW (ref 26.6–33.0)
MCHC: 32.7 g/dL (ref 31.5–35.7)
MCV: 81 fL (ref 79–97)
Monocytes Absolute: 0.6 10*3/uL (ref 0.1–0.9)
Monocytes: 8 %
Neutrophils Absolute: 3.6 10*3/uL (ref 1.4–7.0)
Neutrophils: 50 %
Platelets: 276 10*3/uL (ref 150–450)
RBC: 5.5 x10E6/uL — ABNORMAL HIGH (ref 3.77–5.28)
RDW: 12.4 % (ref 11.7–15.4)
WBC: 7.4 10*3/uL (ref 3.4–10.8)

## 2020-11-26 LAB — LIPID PANEL WITH LDL/HDL RATIO
Cholesterol, Total: 214 mg/dL — ABNORMAL HIGH (ref 100–199)
HDL: 35 mg/dL — ABNORMAL LOW (ref >39)
LDL Chol Calc (NIH): 156 mg/dL — ABNORMAL HIGH (ref 0–99)
LDL/HDL Ratio: 4.5 ratio — ABNORMAL HIGH (ref 0.0–3.2)
Triglycerides: 126 mg/dL (ref 0–149)
VLDL Cholesterol Cal: 23 mg/dL (ref 5–40)

## 2020-11-26 LAB — T4, FREE: Free T4: 1.23 ng/dL (ref 0.82–1.77)

## 2020-12-02 NOTE — Progress Notes (Signed)
Reviewed and will be discussed on next follow up

## 2020-12-23 ENCOUNTER — Ambulatory Visit (INDEPENDENT_AMBULATORY_CARE_PROVIDER_SITE_OTHER): Payer: BC Managed Care – PPO | Admitting: Physician Assistant

## 2020-12-23 ENCOUNTER — Encounter: Payer: Self-pay | Admitting: Physician Assistant

## 2020-12-23 ENCOUNTER — Other Ambulatory Visit: Payer: Self-pay

## 2020-12-23 DIAGNOSIS — Z794 Long term (current) use of insulin: Secondary | ICD-10-CM

## 2020-12-23 DIAGNOSIS — E1165 Type 2 diabetes mellitus with hyperglycemia: Secondary | ICD-10-CM | POA: Diagnosis not present

## 2020-12-23 DIAGNOSIS — E782 Mixed hyperlipidemia: Secondary | ICD-10-CM | POA: Diagnosis not present

## 2020-12-23 DIAGNOSIS — Z0001 Encounter for general adult medical examination with abnormal findings: Secondary | ICD-10-CM

## 2020-12-23 DIAGNOSIS — Z1211 Encounter for screening for malignant neoplasm of colon: Secondary | ICD-10-CM

## 2020-12-23 DIAGNOSIS — R5383 Other fatigue: Secondary | ICD-10-CM

## 2020-12-23 DIAGNOSIS — Z1231 Encounter for screening mammogram for malignant neoplasm of breast: Secondary | ICD-10-CM

## 2020-12-23 DIAGNOSIS — I10 Essential (primary) hypertension: Secondary | ICD-10-CM | POA: Diagnosis not present

## 2020-12-23 DIAGNOSIS — L732 Hidradenitis suppurativa: Secondary | ICD-10-CM

## 2020-12-23 DIAGNOSIS — Z113 Encounter for screening for infections with a predominantly sexual mode of transmission: Secondary | ICD-10-CM

## 2020-12-23 DIAGNOSIS — Z124 Encounter for screening for malignant neoplasm of cervix: Secondary | ICD-10-CM | POA: Diagnosis not present

## 2020-12-23 DIAGNOSIS — R300 Dysuria: Secondary | ICD-10-CM

## 2020-12-23 DIAGNOSIS — Z1212 Encounter for screening for malignant neoplasm of rectum: Secondary | ICD-10-CM

## 2020-12-23 MED ORDER — ROSUVASTATIN CALCIUM 5 MG PO TABS: 5.0000 mg | ORAL_TABLET | Freq: Every day | ORAL | 3 refills | Status: DC

## 2020-12-23 MED ORDER — TRULICITY 1.5 MG/0.5ML ~~LOC~~ SOAJ: 1.5000 mg | SUBCUTANEOUS | 2 refills | Status: DC

## 2020-12-23 MED ORDER — DAPAGLIFLOZIN PROPANEDIOL 10 MG PO TABS: 10.0000 mg | ORAL_TABLET | Freq: Every day | ORAL | 2 refills | Status: DC

## 2020-12-23 NOTE — Progress Notes (Signed)
[facility] [place], [place] [address]  Internal MEDICINE  Office Visit Note  Patient Name: Tammy Beasley  [id number]  [id number]  Date of Service: 12/23/2020  Chief Complaint  Patient presents with  . Annual Exam    Used different soap, irritated vagina but its going away after she changed soap again, unable to lay on right leg for longer than 5 minutes, feels achy, has gotten worse since pt started insulin, pt has black line on finer nail  . Quality Metric Gaps    Colonoscopy, mammogram     HPI Pt is here for routine health maintenance examination -Used soap and had irritation vaginally, so switched soap and it improved some. -Also has an achy pain on R side if she lays on it but otherwise not bothersome. -truclity given last visit and she loves it, fasting has been 125-150; after eating will rises sometimes up to 200, but she was told to not take novalog unless over 200, so she hasnt used any insulin since starting the trulicity and farxiga. Needs scripts for both of these. -also has rash under breasts that never seems to go away it is itchy and irritaintg has tried OTC creams/ointments. This rash is similar to bumps that she gets in her axilla and groin. Appears consistent with HS and she has never had any treatment for this. Discussed that I could start her on an antibiotic, but that a dermatology referral would be most beneficial given the extent of scarring and discoloration she has and pt agreed she would like referral to dermatology. -BP at home--150/90 with wrist monitor. Will try arm cuff and bring her home monitor in next visit to compare. She admits she did not take her medication today and is why her BP is high. She will take it as soon as she leaves today. -Eye exam needs to be scheduled--pt will call to schedule, as well as mammogram and colonoscopy  Current Medication: Outpatient Encounter Medications as of 12/23/2020  Medication Sig  .  dapagliflozin propanediol (FARXIGA) 10 MG TABS tablet Take 1 tablet (10 mg total) by mouth daily before breakfast.  . Dulaglutide (TRULICITY) 1.5 GN/5.6OZ SOPN Inject 1.5 mg into the skin once a week.  . rosuvastatin (CRESTOR) 5 MG tablet Take 1 tablet (5 mg total) by mouth daily.  . Accu-Chek Softclix Lancets lancets Use as instructed to check blood sugars twice a day  E11.65  . amLODipine (NORVASC) 5 MG tablet Take 1 tablet (5 mg total) by mouth daily.  Marland Kitchen glucose blood (ACCU-CHEK GUIDE) test strip Use as directed to check blood sugars twice a day  E11.65   No facility-administered encounter medications on file as of 12/23/2020.    Surgical History: Past Surgical History:  Procedure Laterality Date  . BREAST LUMPECTOMY    . celluilitus      Medical History: Past Medical History:  Diagnosis Date  . Diabetes mellitus without complication ([place])   . Hypertension     Family History: Family History  Problem Relation Age of Onset  . Renal Disease Father   . Kidney failure Father   . Diabetes Father       Review of Systems  Constitutional: Negative for chills, fatigue and unexpected weight change.  HENT: Negative for congestion, postnasal drip, rhinorrhea, sneezing and sore throat.   Eyes: Negative for redness and visual disturbance.  Respiratory: Negative for cough, chest tightness and shortness of breath.   Cardiovascular: Negative for chest pain and palpitations.  Gastrointestinal: Negative for abdominal pain, constipation, diarrhea, nausea and vomiting.  Genitourinary: Negative for dysuria and frequency.  Musculoskeletal: Positive for myalgias. Negative for arthralgias, back pain, joint swelling and neck pain.  Skin: Positive for color change and rash.       Under breast, axilla and groin  Neurological: Negative.  Negative for tremors and numbness.  Hematological: Negative for adenopathy. Does not bruise/bleed easily.  Psychiatric/Behavioral: Negative for behavioral  problems (Depression), sleep disturbance and suicidal ideas. The patient is not nervous/anxious.      Vital Signs: BP (!) 174/92   Pulse 78   Temp (!) 97.3 F (36.3 C)   Resp 16   Ht 5' 4"  (1.626 m)   Wt 220 lb 3.2 oz (99.9 kg)   SpO2 99%   BMI 37.80 kg/m    Physical Exam Exam conducted with a chaperone present.  Constitutional:      General: She is not in acute distress.    Appearance: She is well-developed. She is obese. She is not diaphoretic.  HENT:     Head: Normocephalic and atraumatic.     Right Ear: External ear normal.     Left Ear: External ear normal.     Nose: Nose normal.     Mouth/Throat:     Pharynx: No oropharyngeal exudate.  Eyes:     General: No scleral icterus.       Right eye: No discharge.        Left eye: No discharge.     Conjunctiva/sclera: Conjunctivae normal.     Pupils: Pupils are equal, round, and reactive to light.  Neck:     Thyroid: No thyromegaly.     Vascular: No JVD.     Trachea: No tracheal deviation.  Cardiovascular:     Rate and Rhythm: Normal rate and regular rhythm.     Heart sounds: Normal heart sounds. No murmur heard. No friction rub. No gallop.   Pulmonary:     Effort: Pulmonary effort is normal. No respiratory distress.     Breath sounds: Normal breath sounds. No stridor. No wheezing or rales.  Chest:     Chest wall: No tenderness.  Breasts:     Right: Normal. No mass.     Left: Normal. No mass.      Comments: Skin discoloration under breasts Abdominal:     General: Bowel sounds are normal. There is no distension.     Palpations: Abdomen is soft. There is no mass.     Tenderness: There is no abdominal tenderness. There is no guarding or rebound.  Genitourinary:    Exam position: Lithotomy position.     Vagina: Vaginal discharge present.     Cervix: Friability and erythema present.       Musculoskeletal:        General: No tenderness or deformity. Normal range of motion.     Cervical back: Normal range of  motion and neck supple.  Lymphadenopathy:     Cervical: No cervical adenopathy.  Skin:    General: Skin is warm and dry.     Coloration: Skin is not pale.     Findings: Lesion and rash present. No erythema.     Comments: Significant scarring and cystic lesions in axilla and along groin consistent with HS, also discoloration under breasts  Neurological:     Mental Status: She is alert.     Cranial Nerves: No cranial nerve deficit.     Motor: No abnormal muscle tone.  Coordination: Coordination normal.     Deep Tendon Reflexes: Reflexes are normal and symmetric.  Psychiatric:        Behavior: Behavior normal.        Thought Content: Thought content normal.        Judgment: Judgment normal.      LABS: Recent Results (from the past 2160 hour(s))  POCT HgB A1C     Status: Abnormal   Collection Time: 11/24/20 11:31 AM  Result Value Ref Range   Hemoglobin A1C 11.9 (A) 4.0 - 5.6 %   HbA1c POC (<> result, manual entry)     HbA1c, POC (prediabetic range)     HbA1c, POC (controlled diabetic range)    CBC with Differential/Platelet     Status: Abnormal   Collection Time: 11/25/20  8:56 AM  Result Value Ref Range   WBC 7.4 3.4 - 10.8 x10E3/uL   RBC 5.50 (H) 3.77 - 5.28 x10E6/uL   Hemoglobin 14.6 11.1 - 15.9 g/dL   Hematocrit 44.7 34.0 - 46.6 %   MCV 81 79 - 97 fL   MCH 26.5 (L) 26.6 - 33.0 pg   MCHC 32.7 31.5 - 35.7 g/dL   RDW 12.4 11.7 - 15.4 %   Platelets 276 150 - 450 x10E3/uL   Neutrophils 50 Not Estab. %   Lymphs 40 Not Estab. %   Monocytes 8 Not Estab. %   Eos 2 Not Estab. %   Basos 0 Not Estab. %   Neutrophils Absolute 3.6 1.4 - 7.0 x10E3/uL   Lymphocytes Absolute 2.9 0.7 - 3.1 x10E3/uL   Monocytes Absolute 0.6 0.1 - 0.9 x10E3/uL   EOS (ABSOLUTE) 0.2 0.0 - 0.4 x10E3/uL   Basophils Absolute 0.0 0.0 - 0.2 x10E3/uL   Immature Granulocytes 0 Not Estab. %   Immature Grans (Abs) 0.0 0.0 - 0.1 x10E3/uL  Lipid Panel With LDL/HDL Ratio     Status: Abnormal   Collection  Time: 11/25/20  8:56 AM  Result Value Ref Range   Cholesterol, Total 214 (H) 100 - 199 mg/dL   Triglycerides 126 0 - 149 mg/dL   HDL 35 (L) >39 mg/dL   VLDL Cholesterol Cal 23 5 - 40 mg/dL   LDL Chol Calc (NIH) 156 (H) 0 - 99 mg/dL   LDL/HDL Ratio 4.5 (H) 0.0 - 3.2 ratio    Comment:                                     LDL/HDL Ratio                                             Men  Women                               1/2 Avg.Risk  1.0    1.5                                   Avg.Risk  3.6    3.2                                2X Avg.Risk  6.2    5.0                                3X Avg.Risk  8.0    6.1   TSH     Status: None   Collection Time: 11/25/20  8:56 AM  Result Value Ref Range   TSH 1.820 0.450 - 4.500 uIU/mL  T4, free     Status: None   Collection Time: 11/25/20  8:56 AM  Result Value Ref Range   Free T4 1.23 0.82 - 1.77 ng/dL  Comprehensive metabolic panel     Status: Abnormal   Collection Time: 11/25/20  8:56 AM  Result Value Ref Range   Glucose 163 (H) 65 - 99 mg/dL   BUN 11 6 - 24 mg/dL   Creatinine, Ser 0.82 0.57 - 1.00 mg/dL    Comment:                **Effective November 28, 2020 [facility] will begin**                  reporting the 2021 CKD-EPI creatinine equation that                  estimates kidney function without a race variable.    GFR calc non Af Amer 84 >59 mL/min/1.73   GFR calc Af Amer 96 >59 mL/min/1.73    Comment: **In accordance with recommendations from the NKF-ASN Task force,**   [facility] is in the process of updating its eGFR calculation to the   2021 CKD-EPI creatinine equation that estimates kidney function   without a race variable.    BUN/Creatinine Ratio 13 9 - 23   Sodium 139 134 - 144 mmol/L   Potassium 4.6 3.5 - 5.2 mmol/L   Chloride 102 96 - 106 mmol/L   CO2 23 20 - 29 mmol/L   Calcium 9.8 8.7 - 10.2 mg/dL   Total Protein 7.3 6.0 - 8.5 g/dL   Albumin 4.0 3.8 - 4.8 g/dL   Globulin, Total 3.3 1.5 - 4.5 g/dL   Albumin/Globulin Ratio  1.2 1.2 - 2.2   Bilirubin Total 0.6 0.0 - 1.2 mg/dL   Alkaline Phosphatase 142 (H) 44 - 121 IU/L   AST 12 0 - 40 IU/L   ALT 17 0 - 32 IU/L      Assessment/Plan:  1. Encounter for general adult medical examination with abnormal findings Reviewed lab work, pt due for mammogram and colonoscopy  2. Type 2 diabetes mellitus with hyperglycemia, with long-term current use of insulin ([place]) Script sent for trulicity and farxiga, continue to monitor BG. Will need A1c next visit. - Dulaglutide (TRULICITY) 1.5 HT/3.4KA SOPN; Inject 1.5 mg into the skin once a week.  Dispense: 2 mL; Refill: 2 - dapagliflozin propanediol (FARXIGA) 10 MG TABS tablet; Take 1 tablet (10 mg total) by mouth daily before breakfast.  Dispense: 30 tablet; Refill: 2  3. Essential hypertension, benign Elevated today however pt did not take medication today. She will take it now and monitor closely at home.  4. Mixed hyperlipidemia Elevated on recent labs, will start on statin now. - rosuvastatin (CRESTOR) 5 MG tablet; Take 1 tablet (5 mg total) by mouth daily.  Dispense: 90 tablet; Refill: 3  5. Hidradenitis suppurativa Will refer to dermatology for further evaluation and management due to extensive affected area. Consider starting doxycycline if worsening in meantime until she can get in  to dermatology. - Ambulatory referral to Dermatology  6. Encounter for screening mammogram for malignant neoplasm of breast - MM DIGITAL SCREENING BILATERAL; Future  7. Screening for colorectal cancer - Ambulatory referral to Gastroenterology  8. Routine cervical smear - IGP, Aptima HPV  9. Screening for STDs (sexually transmitted diseases) - NuSwab Vaginitis Plus (VG+)  10. Dysuria - UA/M w/rflx Culture, Routine   General Counseling: Teshia verbalizes understanding of the findings of todays visit and agrees with plan of treatment. I have discussed any further diagnostic evaluation that may be needed or ordered today. We also  reviewed her medications today. she has been encouraged to call the office with any questions or concerns that should arise related to todays visit.    Counseling:  Hypertension Counseling:   The following hypertensive lifestyle modification were recommended and discussed:  1. Limiting alcohol intake to less than 1 oz/day of ethanol:(24 oz of beer or 8 oz of wine or 2 oz of 100-proof whiskey). 2. Take baby ASA 81 mg daily. 3. Importance of regular aerobic exercise and losing weight. 4. Reduce dietary saturated fat and cholesterol intake for overall cardiovascular health. 5. Maintaining adequate dietary potassium, calcium, and magnesium intake. 6. Regular monitoring of the blood pressure. 7. Reduce sodium intake to less than 100 mmol/day (less than 2.3 gm of sodium or less than 6 gm of sodium choride)   Orders Placed This Encounter  Procedures  . MM DIGITAL SCREENING BILATERAL  . UA/M w/rflx Culture, Routine  . NuSwab Vaginitis Plus (VG+)  . CBC with Differential/Platelet  . Lipid Panel With LDL/HDL Ratio  . TSH  . T4, free  . Comprehensive metabolic panel  . Ambulatory referral to Gastroenterology  . Ambulatory referral to Dermatology    Meds ordered this encounter  Medications  . Dulaglutide (TRULICITY) 1.5 ER/7.4YC SOPN    Sig: Inject 1.5 mg into the skin once a week.    Dispense:  2 mL    Refill:  2  . dapagliflozin propanediol (FARXIGA) 10 MG TABS tablet    Sig: Take 1 tablet (10 mg total) by mouth daily before breakfast.    Dispense:  30 tablet    Refill:  2  . rosuvastatin (CRESTOR) 5 MG tablet    Sig: Take 1 tablet (5 mg total) by mouth daily.    Dispense:  90 tablet    Refill:  3    This patient was seen by Drema Dallas, PA-C in collaboration with Dr. Clayborn Bigness as a part of collaborative care agreement.  Total time spent:30 Minutes  Time spent includes review of chart, medications, test results, and follow up plan with the patient.     Lavera Guise,  MD  Internal Medicine

## 2020-12-26 LAB — MICROSCOPIC EXAMINATION: WBC, UA: >30 /hpf — AB (ref 0–5)

## 2020-12-26 LAB — NUSWAB VAGINITIS PLUS (VG+)
Candida albicans, NAA: NEGATIVE
Candida glabrata, NAA: POSITIVE — AB
Chlamydia trachomatis, NAA: NEGATIVE
Neisseria gonorrhoeae, NAA: NEGATIVE
Trich vag by NAA: NEGATIVE

## 2020-12-26 LAB — UA/M W/RFLX CULTURE, ROUTINE
Bilirubin, UA: NEGATIVE
Ketones, UA: NEGATIVE
Leukocytes,UA: NEGATIVE
Nitrite, UA: NEGATIVE
Protein,UA: NEGATIVE
RBC, UA: NEGATIVE
Specific Gravity, UA: 1.027 (ref 1.005–1.030)
Urobilinogen, Ur: 0.2 mg/dL (ref 0.2–1.0)
pH, UA: 5.5 (ref 5.0–7.5)

## 2020-12-26 LAB — URINE CULTURE, REFLEX

## 2020-12-29 ENCOUNTER — Other Ambulatory Visit: Payer: Self-pay | Admitting: Physician Assistant

## 2020-12-29 DIAGNOSIS — B3731 Acute candidiasis of vulva and vagina: Secondary | ICD-10-CM

## 2020-12-29 DIAGNOSIS — B373 Candidiasis of vulva and vagina: Secondary | ICD-10-CM

## 2020-12-29 LAB — IGP, APTIMA HPV: HPV Aptima: NEGATIVE

## 2020-12-29 MED ORDER — FLUCONAZOLE 150 MG PO TABS: 150.0000 mg | ORAL_TABLET | Freq: Once | ORAL | 0 refills | Status: AC

## 2020-12-30 ENCOUNTER — Telehealth: Payer: Self-pay

## 2020-12-30 NOTE — Telephone Encounter (Signed)
Unable to [facility] to inform pt pap was normal but pt has yeast infection, meds have been sent to pharmacy.

## 2020-12-30 NOTE — Telephone Encounter (Signed)
-----   Message from Mylinda Latina, PA-C sent at 12/29/2020  2:14 PM EDT ----- Please let her know her pap was normal, but she does have a yeast infection--I will call in a medication for her

## 2021-01-03 ENCOUNTER — Telehealth: Payer: Self-pay

## 2021-01-03 NOTE — Telephone Encounter (Signed)
-----   Message from Mylinda Latina, PA-C sent at 12/29/2020  2:14 PM EDT ----- Please let her know her pap was normal, but she does have a yeast infection--I will call in a medication for her

## 2021-01-03 NOTE — Telephone Encounter (Signed)
LMOM wanted to make sure she picked up her meds and need to tell her pap was normal

## 2021-01-04 ENCOUNTER — Telehealth: Payer: Self-pay

## 2021-01-04 NOTE — Telephone Encounter (Signed)
LMOM to verify meds were picked up, pt needs to know pap was normal, but she has a yeast infection

## 2021-01-04 NOTE — Telephone Encounter (Signed)
-----   Message from Mylinda Latina, PA-C sent at 12/29/2020  2:14 PM EDT ----- Please let her know her pap was normal, but she does have a yeast infection--I will call in a medication for her

## 2021-01-11 ENCOUNTER — Other Ambulatory Visit: Payer: Self-pay

## 2021-01-11 ENCOUNTER — Telehealth: Payer: Self-pay

## 2021-01-11 MED ORDER — FLUCONAZOLE 150 MG PO TABS: ORAL_TABLET | ORAL | 1 refills | Status: DC

## 2021-01-11 NOTE — Telephone Encounter (Signed)
LMOM to verify meds were picked up, pt needs to know pap was normal, but she has a yeast infection

## 2021-01-18 ENCOUNTER — Encounter: Payer: Self-pay | Admitting: *Deleted

## 2021-02-23 ENCOUNTER — Ambulatory Visit: Payer: BC Managed Care – PPO | Admitting: Physician Assistant

## 2021-03-09 ENCOUNTER — Encounter: Payer: Self-pay | Admitting: Nurse Practitioner

## 2021-03-09 ENCOUNTER — Other Ambulatory Visit: Payer: Self-pay

## 2021-03-09 ENCOUNTER — Ambulatory Visit: Payer: BC Managed Care – PPO | Admitting: Nurse Practitioner

## 2021-03-09 VITALS — BP 163/95 | HR 71 | Temp 97.8°F | Resp 16 | Ht 64.0 in | Wt 224.0 lb

## 2021-03-09 DIAGNOSIS — Z1212 Encounter for screening for malignant neoplasm of rectum: Secondary | ICD-10-CM

## 2021-03-09 DIAGNOSIS — Z1231 Encounter for screening mammogram for malignant neoplasm of breast: Secondary | ICD-10-CM

## 2021-03-09 DIAGNOSIS — E1165 Type 2 diabetes mellitus with hyperglycemia: Secondary | ICD-10-CM

## 2021-03-09 DIAGNOSIS — N390 Urinary tract infection, site not specified: Secondary | ICD-10-CM | POA: Diagnosis not present

## 2021-03-09 DIAGNOSIS — R300 Dysuria: Secondary | ICD-10-CM | POA: Diagnosis not present

## 2021-03-09 DIAGNOSIS — I10 Essential (primary) hypertension: Secondary | ICD-10-CM | POA: Diagnosis not present

## 2021-03-09 DIAGNOSIS — E782 Mixed hyperlipidemia: Secondary | ICD-10-CM

## 2021-03-09 DIAGNOSIS — Z794 Long term (current) use of insulin: Secondary | ICD-10-CM

## 2021-03-09 DIAGNOSIS — L732 Hidradenitis suppurativa: Secondary | ICD-10-CM

## 2021-03-09 DIAGNOSIS — Z1211 Encounter for screening for malignant neoplasm of colon: Secondary | ICD-10-CM

## 2021-03-09 LAB — POCT UA - MICROALBUMIN
Albumin/Creatinine Ratio, Urine, POC: <30
Creatinine, POC: 200 mg/dL
Microalbumin Ur, POC: 30 mg/L

## 2021-03-09 LAB — POCT URINALYSIS DIPSTICK
Bilirubin, UA: NEGATIVE
Blood, UA: NEGATIVE
Glucose, UA: NEGATIVE
Ketones, UA: NEGATIVE
Leukocytes, UA: NEGATIVE
Nitrite, UA: POSITIVE
Protein, UA: NEGATIVE
Spec Grav, UA: 1.025 (ref 1.010–1.025)
Urobilinogen, UA: 0.2 E.U./dL
pH, UA: 6 (ref 5.0–8.0)

## 2021-03-09 LAB — POCT GLYCOSYLATED HEMOGLOBIN (HGB A1C): Hemoglobin A1C: 7.1 % — AB (ref 4.0–5.6)

## 2021-03-09 MED ORDER — AMLODIPINE BESYLATE 5 MG PO TABS: 5.0000 mg | ORAL_TABLET | Freq: Every day | ORAL | 1 refills | Status: DC

## 2021-03-09 MED ORDER — NITROFURANTOIN MONOHYD MACRO 100 MG PO CAPS: 100.0000 mg | ORAL_CAPSULE | Freq: Two times a day (BID) | ORAL | 0 refills | Status: AC

## 2021-03-09 MED ORDER — ROSUVASTATIN CALCIUM 5 MG PO TABS: 5.0000 mg | ORAL_TABLET | Freq: Every day | ORAL | 3 refills | Status: DC

## 2021-03-09 MED ORDER — DAPAGLIFLOZIN PROPANEDIOL 10 MG PO TABS: 10.0000 mg | ORAL_TABLET | Freq: Every day | ORAL | 2 refills | Status: DC

## 2021-03-09 MED ORDER — TRULICITY 1.5 MG/0.5ML ~~LOC~~ SOAJ: 1.5000 mg | SUBCUTANEOUS | 2 refills | Status: DC

## 2021-03-09 NOTE — Progress Notes (Signed)
Samples of faxiga

## 2021-03-09 NOTE — Progress Notes (Signed)
[name] [place], [place] [address]  Internal MEDICINE  Office Visit Note  Tammy Beasley Name: Tammy Beasley  [id number]  [id number]  Date of Service: 03/17/2021  Chief Complaint  Tammy Beasley presents with   Follow-up   Diabetes   Hypertension   Quality Metric Gaps    Colonoscopy,shingles,pneumnovax     Urinary Tract Infection    HPI Tammy Beasley presents for follow-up visit to check Tammy Beasley A1c and discuss Tammy Beasley hypertension.  Tammy Beasley is due for Tammy Beasley colorectal cancer screening, options were discussed and Tammy Beasley would like to be referred to gastroenterology for routine colonoscopy.  Tammy Beasley also reports current symptoms of a urinary tract infection.  And recently took Diflucan for a yeast infection.  Tammy Beasley is also requesting medication refills. - Tammy Beasley previous A1c was 11.93 months ago.  Today Tammy Beasley A1c is 7.1.  Tammy Beasley is currently taking Trulicity and [place].  As of Tammy Beasley office visit today, Tammy Beasley has gained 4 pounds since Tammy Beasley last office visit on December 23, 2020.  Tammy Beasley will continue Tammy [place] and Trulicity, at Tammy Beasley next office visit Tammy Beasley weight will be reevaluated and if Tammy Beasley is still having difficulty losing weight on Farxiga and Trulicity other options will be discussed.   Tammy Beasley does report that Tammy Beasley does not consistently take Tammy Beasley medications on a daily basis sometimes Tammy Beasley will skip Tammy Beasley amlodipine for Tammy Beasley blood pressure or Tammy Beasley will forget to take Tammy Beasley [place], discussed Tammy importance of medication adherence and controlling blood pressure and glucose levels. -lipid panel was drawn in February. With Tammy Beasley most recent lipid panel and other factors including Tammy Beasley BP, smoking, and history of diabetes, Tammy Beasley current estimated 10-year ASCVD risk is 38.7%. Tammy Beasley was started on rosuvastatin in March 2022 and is not yet on Tammy optimal dose for Tammy Beasley current ASCVD risk.  Current Medication: Outpatient Encounter Medications as of 03/09/2021  Medication Sig   Accu-Chek Softclix Lancets lancets Use as instructed to  check blood sugars twice a day  E11.65   fluconazole (DIFLUCAN) 150 MG tablet Take 1 tablet by mouth and three days later may repeat if symptoms persists   glucose blood (ACCU-CHEK GUIDE) test strip Use as directed to check blood sugars twice a day  E11.65   [EXPIRED] nitrofurantoin, macrocrystal-monohydrate, (MACROBID) 100 MG capsule Take 1 capsule (100 mg total) by mouth 2 (two) times daily for 7 days.   [DISCONTINUED] amLODipine (NORVASC) 5 MG tablet Take 1 tablet (5 mg total) by mouth daily.   [DISCONTINUED] dapagliflozin propanediol (FARXIGA) 10 MG TABS tablet Take 1 tablet (10 mg total) by mouth daily before breakfast.   [DISCONTINUED] Dulaglutide (TRULICITY) 1.5 TD/3.2KG SOPN Inject 1.5 mg into Tammy skin once a week.   [DISCONTINUED] rosuvastatin (CRESTOR) 5 MG tablet Take 1 tablet (5 mg total) by mouth daily.   amLODipine (NORVASC) 5 MG tablet Take 1 tablet (5 mg total) by mouth daily.   dapagliflozin propanediol (FARXIGA) 10 MG TABS tablet Take 1 tablet (10 mg total) by mouth daily before breakfast.   Dulaglutide (TRULICITY) 1.5 UR/4.2HC SOPN Inject 1.5 mg into Tammy skin once a week.   rosuvastatin (CRESTOR) 5 MG tablet Take 1 tablet (5 mg total) by mouth daily.   No facility-administered encounter medications on file as of 03/09/2021.    Surgical History: Past Surgical History:  Procedure Laterality Date   BREAST LUMPECTOMY     celluilitus      Medical History: Past Medical History:  Diagnosis Date   Diabetes mellitus without complication ([place])  Hypertension     Family History: Family History  Problem Relation Age of Onset   Renal Disease Father    Kidney failure Father    Diabetes Father     Social History   Socioeconomic History   Marital status: Single    Spouse name: Not on file   Number of children: Not on file   Years of education: Not on file   Highest education level: Not on file  Occupational History   Not on file  Tobacco Use   Smoking status: Every  Day    Pack years: 0.00    Types: Cigarettes   Smokeless tobacco: Never   Tobacco comments:    trying to quit  Substance and Sexual Activity   Alcohol use: Not Currently   Drug use: Never   Sexual activity: Not on file  Other Topics Concern   Not on file  Social History Narrative   Not on file   Social Determinants of Health   Financial Resource Strain: Not on file  Food Insecurity: Not on file  Transportation Needs: Not on file  Physical Activity: Not on file  Stress: Not on file  Social Connections: Not on file  Intimate Partner Violence: Not on file      Review of Systems  Constitutional:  Negative for chills, fatigue and unexpected weight change.  HENT:  Negative for congestion, rhinorrhea, sneezing and sore throat.   Eyes:  Negative for redness.  Respiratory:  Negative for cough, chest tightness and shortness of breath.   Cardiovascular:  Negative for chest pain and palpitations.  Gastrointestinal:  Negative for abdominal pain, constipation, diarrhea, nausea and vomiting.  Genitourinary:  Negative for dysuria and frequency.  Musculoskeletal:  Negative for arthralgias, back pain, joint swelling and neck pain.  Skin:  Negative for rash.  Neurological: Negative.  Negative for tremors and numbness.  Hematological:  Negative for adenopathy. Does not bruise/bleed easily.  Psychiatric/Behavioral:  Negative for behavioral problems (Depression), sleep disturbance and suicidal ideas. Tammy Beasley is not nervous/anxious.    Vital Signs: BP (!) 163/95   Pulse 71   Temp 97.8 F (36.6 C)   Resp 16   Ht 5\' 4"  (1.626 m)   Wt 224 lb (101.6 kg)   SpO2 97%   BMI 38.45 kg/m    Physical Exam Vitals reviewed.  Constitutional:      General: Tammy Beasley is not in acute distress.    Appearance: Normal appearance. Tammy Beasley is well-developed. Tammy Beasley is obese. Tammy Beasley is not ill-appearing or diaphoretic.  HENT:     Head: Normocephalic and atraumatic.  Neck:     Thyroid: No thyromegaly.      Vascular: No JVD.     Trachea: No tracheal deviation.  Cardiovascular:     Rate and Rhythm: Normal rate and regular rhythm.     Pulses: Normal pulses.     Heart sounds: Normal heart sounds. No murmur heard.   No friction rub. No gallop.  Pulmonary:     Effort: Pulmonary effort is normal. No respiratory distress.     Breath sounds: Normal breath sounds. No wheezing or rales.  Chest:     Chest wall: No tenderness.  Abdominal:     General: Bowel sounds are normal.     Palpations: Abdomen is soft.     Tenderness: There is no right CVA tenderness or left CVA tenderness.  Musculoskeletal:        General: Normal range of motion.  Skin:    General: Skin  is warm and dry.     Capillary Refill: Capillary refill takes less than 2 seconds.  Neurological:     Mental Status: Tammy Beasley is alert and oriented to person, place, and time.  Psychiatric:        Mood and Affect: Mood normal.        Behavior: Behavior normal.    Assessment/Plan: 1. Type 2 diabetes mellitus with hyperglycemia, with long-term current use of insulin (HCC) Although Tammy Beasley has been nonadherent to Tammy Beasley medication regimen, Tammy Beasley A!C has made drastic improvement. Continue medication as prescribed for a goal A1C of under 7.0. Discussed Tammy importance of adherence to Tammy Beasley medication regimen.Tammy Beasley acknowledged understanding.  - POCT HgB A1C - POCT UA - Microalbumin - Dulaglutide (TRULICITY) 1.5 OE/7.0JJ SOPN; Inject 1.5 mg into Tammy skin once a week.  Dispense: 2 mL; Refill: 2 - dapagliflozin propanediol (FARXIGA) 10 MG TABS tablet; Take 1 tablet (10 mg total) by mouth daily before breakfast.  Dispense: 30 tablet; Refill: 2  2. Essential hypertension Tammy Beasley has not been consistent or compliant in taking Tammy Beasley medication as prescribed, discussed adherence to medication regimen. Recheck BP in 4 weeks after consistently taking medication as prescribed to reevaluate if additional medication adjustment is necessary. - amLODipine (NORVASC) 5 MG  tablet; Take 1 tablet (5 mg total) by mouth daily.  Dispense: 30 tablet; Refill: 1  3. Urinary tract infection without hematuria, site unspecified Empiric treatment for UTI symptoms, culture sent.  - CULTURE, URINE COMPREHENSIVE - nitrofurantoin, macrocrystal-monohydrate, (MACROBID) 100 MG capsule; Take 1 capsule (100 mg total) by mouth 2 (two) times daily for 7 days.  Dispense: 14 capsule; Refill: 0  4. Mixed hyperlipidemia Current estimated 10-year ASCVD risk is 38.7%, recommendation is high dose statin therapy and Tammy addition of ezetimibe. Rosuvastatin dose increased and ezetimibe ordered.  - rosuvastatin (CRESTOR) 20 MG tablet; Take 1 tablet (20 mg total) by mouth daily.  Dispense: 90 tablet; Refill: 1 - ezetimibe (ZETIA) 10 MG tablet; Take 1 tablet (10 mg total) by mouth daily.  Dispense: 90 tablet; Refill: 1  5. Encounter for screening mammogram for malignant neoplasm of breast Mammogram previously ordered but not scheduled yet.   6. Screening for colorectal cancer GI referral for colonoscopy was ordered in march, not yet scheduled. A letter was sent out from [place] to Tammy Beasley after they have attempted to call to schedule with no success.  7. Hidradenitis suppurativa Dermatology referral was ordered on a previous visit. Tammy Beasley has an upcoming appointment scheduled with dermatology in October 2022.   8. Dysuria Reports symptoms of UTI, urine specimen obtained for U/A and culture.  - POCT urinalysis dipstick - CULTURE, URINE COMPREHENSIVE      General Counseling: Wade Tammy Beasley understanding of Tammy findings of todays visit and agrees with plan of treatment. I have discussed any further diagnostic evaluation that may be needed or ordered today. We also reviewed Tammy Beasley medications today. Tammy Beasley has been encouraged to call Tammy office with any questions or concerns that should arise related to todays visit.    Orders Placed This Encounter  Procedures   CULTURE, URINE  COMPREHENSIVE   Ambulatory referral to Gastroenterology   POCT HgB A1C   POCT urinalysis dipstick   POCT UA - Microalbumin    Meds ordered this encounter  Medications   Dulaglutide (TRULICITY) 1.5 KK/9.3GH SOPN    Sig: Inject 1.5 mg into Tammy skin once a week.    Dispense:  2 mL    Refill:  2  dapagliflozin propanediol (FARXIGA) 10 MG TABS tablet    Sig: Take 1 tablet (10 mg total) by mouth daily before breakfast.    Dispense:  30 tablet    Refill:  2   rosuvastatin (CRESTOR) 5 MG tablet    Sig: Take 1 tablet (5 mg total) by mouth daily.    Dispense:  90 tablet    Refill:  3   amLODipine (NORVASC) 5 MG tablet    Sig: Take 1 tablet (5 mg total) by mouth daily.    Dispense:  30 tablet    Refill:  1   nitrofurantoin, macrocrystal-monohydrate, (MACROBID) 100 MG capsule    Sig: Take 1 capsule (100 mg total) by mouth 2 (two) times daily for 7 days.    Dispense:  14 capsule    Refill:  0    Return in about 4 weeks (around 04/06/2021) for F/U, BP check, [place] PCP.   Total time spent:30 Minutes Time spent includes review of chart, medications, test results, and follow up plan with Tammy Beasley.   [place] Controlled Substance Database was reviewed by me.  This Tammy Beasley was seen by Jonetta Osgood, FNP-C in collaboration with Dr. Clayborn Bigness as a part of collaborative care agreement.   Derryck Shahan R. Valetta Fuller, MSN, FNP-C Internal medicine

## 2021-03-16 LAB — CULTURE, URINE COMPREHENSIVE

## 2021-03-17 MED ORDER — ROSUVASTATIN CALCIUM 20 MG PO TABS: 20.0000 mg | ORAL_TABLET | Freq: Every day | ORAL | 1 refills | Status: DC

## 2021-03-17 MED ORDER — EZETIMIBE 10 MG PO TABS: 10.0000 mg | ORAL_TABLET | Freq: Every day | ORAL | 1 refills | Status: DC

## 2021-04-04 ENCOUNTER — Telehealth: Payer: Self-pay

## 2021-04-04 NOTE — Telephone Encounter (Signed)
Left vm to screen for 04/05/21 appointment-Toni

## 2021-04-05 ENCOUNTER — Other Ambulatory Visit: Payer: Self-pay

## 2021-04-05 ENCOUNTER — Ambulatory Visit (INDEPENDENT_AMBULATORY_CARE_PROVIDER_SITE_OTHER): Payer: BC Managed Care – PPO | Admitting: Nurse Practitioner

## 2021-04-05 ENCOUNTER — Encounter: Payer: Self-pay | Admitting: Nurse Practitioner

## 2021-04-05 VITALS — BP 170/96 | HR 75 | Temp 98.3°F | Resp 16 | Ht 64.0 in | Wt 224.8 lb

## 2021-04-05 DIAGNOSIS — H9201 Otalgia, right ear: Secondary | ICD-10-CM

## 2021-04-05 DIAGNOSIS — I10 Essential (primary) hypertension: Secondary | ICD-10-CM | POA: Diagnosis not present

## 2021-04-05 DIAGNOSIS — Z794 Long term (current) use of insulin: Secondary | ICD-10-CM

## 2021-04-05 DIAGNOSIS — E782 Mixed hyperlipidemia: Secondary | ICD-10-CM

## 2021-04-05 DIAGNOSIS — H6121 Impacted cerumen, right ear: Secondary | ICD-10-CM | POA: Diagnosis not present

## 2021-04-05 DIAGNOSIS — E1165 Type 2 diabetes mellitus with hyperglycemia: Secondary | ICD-10-CM

## 2021-04-05 MED ORDER — AMLODIPINE BESYLATE 10 MG PO TABS: 10.0000 mg | ORAL_TABLET | Freq: Every day | ORAL | 0 refills | Status: DC

## 2021-04-05 MED ORDER — HYDROCHLOROTHIAZIDE 12.5 MG PO TABS: 12.5000 mg | ORAL_TABLET | Freq: Every day | ORAL | 0 refills | Status: DC

## 2021-04-05 NOTE — Progress Notes (Signed)
[facility] [place], [place] [address]  Internal MEDICINE  Office Visit Note  Patient Name: Tammy Beasley  [id number]  [id number]  Date of Service: 04/05/2021  Chief Complaint  Patient presents with   Follow-up    Right ear feel clogged up, hard to hear in the morning its takes about 15-20 minutes     Diabetes   Hypertension    Bp check   Quality Metric Gaps    Colonoscopy     HPI Tammy Beasley presents for a follow up visit for diabetes and hypertension. At her previous office visit, her blood pressure was elevated. She returns today to recheck her blood pressure which is still elevated. She reports that she has been taking her medications consistently since her last office visit. Her current blood pressure with consistent adherence to prescribed medications is still significantly elevated so her medications will be adjusted for today's visit.  -At her previous office visit she had gained 4 lbs. She is taking farxiga and trulicity for diabetes management. At today's visit, her weight is unchanged. She has not gained any weight since her last office visit and did not lose any weight either. Glucose levels have been stable at home.  -She is tolerating the increased dose of rosuvastatin with no adverse side effects.  -her UTI symptoms have resolved since she completed the antibiotic course prescribed at her last office visit.     Current Medication: Outpatient Encounter Medications as of 04/05/2021  Medication Sig   Accu-Chek Softclix Lancets lancets Use as instructed to check blood sugars twice a day  E11.65   amLODipine (NORVASC) 10 MG tablet Take 1 tablet (10 mg total) by mouth daily.   dapagliflozin propanediol (FARXIGA) 10 MG TABS tablet Take 1 tablet (10 mg total) by mouth daily before breakfast.   Dulaglutide (TRULICITY) 1.5 XB/2.8UX SOPN Inject 1.5 mg into the skin once a week.   ezetimibe (ZETIA) 10 MG tablet Take 1 tablet (10 mg total) by mouth daily.    glucose blood (ACCU-CHEK GUIDE) test strip Use as directed to check blood sugars twice a day  E11.65   hydrochlorothiazide (HYDRODIURIL) 12.5 MG tablet Take 1 tablet (12.5 mg total) by mouth daily.   rosuvastatin (CRESTOR) 20 MG tablet Take 1 tablet (20 mg total) by mouth daily.   [DISCONTINUED] amLODipine (NORVASC) 5 MG tablet Take 1 tablet (5 mg total) by mouth daily.   [DISCONTINUED] fluconazole (DIFLUCAN) 150 MG tablet Take 1 tablet by mouth and three days later may repeat if symptoms persists (Patient not taking: Reported on 04/05/2021)   No facility-administered encounter medications on file as of 04/05/2021.    Surgical History: Past Surgical History:  Procedure Laterality Date   BREAST LUMPECTOMY     celluilitus      Medical History: Past Medical History:  Diagnosis Date   Diabetes mellitus without complication ([place])    Hypertension     Family History: Family History  Problem Relation Age of Onset   Renal Disease Father    Kidney failure Father    Diabetes Father     Social History   Socioeconomic History   Marital status: Single    Spouse name: Not on file   Number of children: Not on file   Years of education: Not on file   Highest education level: Not on file  Occupational History   Not on file  Tobacco Use   Smoking status: Every Day    Types: Cigarettes   Smokeless  tobacco: Never   Tobacco comments:    trying to quit  Substance and Sexual Activity   Alcohol use: Not Currently   Drug use: Never   Sexual activity: Not on file  Other Topics Concern   Not on file  Social History Narrative   Not on file   Social Determinants of Health   Financial Resource Strain: Not on file  Food Insecurity: Not on file  Transportation Needs: Not on file  Physical Activity: Not on file  Stress: Not on file  Social Connections: Not on file  Intimate Partner Violence: Not on file      Review of Systems  Constitutional:  Negative for chills, fatigue and  unexpected weight change.  HENT:  Positive for ear pain (ear fullness, feels clogged). Negative for congestion, rhinorrhea, sneezing and sore throat.        Muffled hearing  Eyes:  Negative for redness.  Respiratory:  Negative for cough, chest tightness and shortness of breath.   Cardiovascular:  Negative for chest pain and palpitations.  Gastrointestinal:  Negative for abdominal pain, constipation, diarrhea, nausea and vomiting.  Genitourinary:  Negative for dysuria and frequency.  Musculoskeletal:  Negative for arthralgias, back pain, joint swelling and neck pain.  Skin:  Negative for rash.  Neurological: Negative.  Negative for tremors and numbness.  Hematological:  Negative for adenopathy. Does not bruise/bleed easily.  Psychiatric/Behavioral:  Negative for behavioral problems (Depression), sleep disturbance and suicidal ideas. The patient is not nervous/anxious.    Vital Signs: BP (!) 170/96   Pulse 75   Temp 98.3 F (36.8 C)   Resp 16   Ht 5\' 4"  (1.626 m)   Wt 224 lb 12.8 oz (102 kg)   SpO2 95%   BMI 38.59 kg/m    Physical Exam Vitals reviewed.  Constitutional:      General: She is not in acute distress.    Appearance: Normal appearance. She is obese. She is not ill-appearing.  HENT:     Head: Normocephalic and atraumatic.     Right Ear: Ear canal and external ear normal. There is impacted cerumen (resolved after ear lavage).     Left Ear: Tympanic membrane, ear canal and external ear normal.  Cardiovascular:     Rate and Rhythm: Normal rate and regular rhythm.  Pulmonary:     Effort: Pulmonary effort is normal.  Skin:    General: Skin is warm and dry.     Capillary Refill: Capillary refill takes less than 2 seconds.  Neurological:     Mental Status: She is alert and oriented to person, place, and time.  Psychiatric:        Mood and Affect: Mood normal.        Behavior: Behavior normal.       Assessment/Plan: 1. Essential hypertension Amlodipine dose  increased to 10 mg daily and hydrochlorothiazide 12.5 mg daily was added as well. Follow up in 4 weeks to determine effectiveness of medication adjustments. - amLODipine (NORVASC) 10 MG tablet; Take 1 tablet (10 mg total) by mouth daily.  Dispense: 90 tablet; Refill: 0 - hydrochlorothiazide (HYDRODIURIL) 12.5 MG tablet; Take 1 tablet (12.5 mg total) by mouth daily.  Dispense: 30 tablet; Refill: 0  2. Type 2 diabetes mellitus with hyperglycemia, with long-term current use of insulin (HCC) Glucose levels at home have been stable. Continue farxiga and trulicity as prescribed, will recheck A1C in 2 more months.   3. Mixed hyperlipidemia Rosuvastatin dose was increased to 20 mg daily  at previous office visit. She is tolerating the increased dose with no reported side effects.   4. Right ear pain Right ear was hurting and feeling clogged causing sounds to be muffled. Impacted cerumen noted on exam. - EAR CERUMEN REMOVAL  5. Impacted cerumen of right ear After confirming impacted cerumen of the right ear, discussed in office ear lavage, patient requested it to be done. Impaction resolved after ear lavage.  - EAR CERUMEN REMOVAL   General Counseling: Tammy Beasley verbalizes understanding of the findings of todays visit and agrees with plan of treatment. I have discussed any further diagnostic evaluation that may be needed or ordered today. We also reviewed her medications today. she has been encouraged to call the office with any questions or concerns that should arise related to todays visit.    Orders Placed This Encounter  Procedures   EAR CERUMEN REMOVAL    Meds ordered this encounter  Medications   amLODipine (NORVASC) 10 MG tablet    Sig: Take 1 tablet (10 mg total) by mouth daily.    Dispense:  90 tablet    Refill:  0   hydrochlorothiazide (HYDRODIURIL) 12.5 MG tablet    Sig: Take 1 tablet (12.5 mg total) by mouth daily.    Dispense:  30 tablet    Refill:  0    Return in about 4  weeks (around 05/03/2021) for F/U, BP check, [place] PCP.   Total time spent:30 Minutes Time spent includes review of chart, medications, test results, and follow up plan with the patient.   [place] Controlled Substance Database was reviewed by me.  This patient was seen by Jonetta Osgood, FNP-C in collaboration with Dr. Clayborn Bigness as a part of collaborative care agreement.   Charleston Hankin R. Valetta Fuller, MSN, FNP-C Internal medicine

## 2021-05-03 ENCOUNTER — Telehealth: Payer: Self-pay

## 2021-05-03 NOTE — Telephone Encounter (Signed)
Left vm for 05/04/21 appointment screening-Toni

## 2021-05-04 ENCOUNTER — Ambulatory Visit: Payer: BC Managed Care – PPO | Admitting: Nurse Practitioner

## 2021-06-19 ENCOUNTER — Other Ambulatory Visit: Payer: Self-pay

## 2021-06-19 DIAGNOSIS — E1165 Type 2 diabetes mellitus with hyperglycemia: Secondary | ICD-10-CM

## 2021-06-19 DIAGNOSIS — Z794 Long term (current) use of insulin: Secondary | ICD-10-CM

## 2021-06-19 MED ORDER — TRULICITY 1.5 MG/0.5ML ~~LOC~~ SOAJ: 1.5000 mg | SUBCUTANEOUS | 2 refills | Status: DC

## 2021-06-20 ENCOUNTER — Telehealth: Payer: Self-pay

## 2021-06-20 NOTE — Telephone Encounter (Signed)
Patient scheduled for 06/22/21, she is requesting enough Trulicity to be sent to [facility] pharmacy to last her till then-Toni

## 2021-06-21 ENCOUNTER — Ambulatory Visit: Payer: BC Managed Care – PPO | Admitting: Nurse Practitioner

## 2021-06-22 ENCOUNTER — Ambulatory Visit: Payer: BC Managed Care – PPO | Admitting: Nurse Practitioner

## 2021-06-26 ENCOUNTER — Other Ambulatory Visit: Payer: Self-pay

## 2021-06-27 ENCOUNTER — Telehealth: Payer: Self-pay

## 2021-06-27 NOTE — Telephone Encounter (Signed)
Spoke to pharmacy about pt's trulicity prescription and pharmacist advised that pt picked up rx on 06/26/21 and it went thru her insurance as only $25.00 month

## 2021-07-07 ENCOUNTER — Encounter: Payer: Self-pay | Admitting: Nurse Practitioner

## 2021-07-07 ENCOUNTER — Other Ambulatory Visit: Payer: Self-pay

## 2021-07-07 ENCOUNTER — Ambulatory Visit: Payer: BC Managed Care – PPO | Admitting: Nurse Practitioner

## 2021-07-07 VITALS — BP 150/94 | HR 77 | Temp 97.8°F | Resp 16 | Ht 64.0 in | Wt 230.0 lb

## 2021-07-07 DIAGNOSIS — Z794 Long term (current) use of insulin: Secondary | ICD-10-CM

## 2021-07-07 DIAGNOSIS — I10 Essential (primary) hypertension: Secondary | ICD-10-CM

## 2021-07-07 DIAGNOSIS — E782 Mixed hyperlipidemia: Secondary | ICD-10-CM

## 2021-07-07 DIAGNOSIS — E1165 Type 2 diabetes mellitus with hyperglycemia: Secondary | ICD-10-CM

## 2021-07-07 DIAGNOSIS — Z6839 Body mass index (BMI) 39.0-39.9, adult: Secondary | ICD-10-CM | POA: Diagnosis not present

## 2021-07-07 LAB — POCT GLYCOSYLATED HEMOGLOBIN (HGB A1C): Hemoglobin A1C: 8.4 % — AB (ref 4.0–5.6)

## 2021-07-07 MED ORDER — EZETIMIBE 10 MG PO TABS
10.0000 mg | ORAL_TABLET | Freq: Every day | ORAL | 1 refills | Status: DC
Start: 2021-07-07 — End: 2021-10-13

## 2021-07-07 MED ORDER — DAPAGLIFLOZIN PROPANEDIOL 10 MG PO TABS: 10.0000 mg | ORAL_TABLET | Freq: Every day | ORAL | 5 refills | Status: DC

## 2021-07-07 MED ORDER — HYDROCHLOROTHIAZIDE 12.5 MG PO TABS
12.5000 mg | ORAL_TABLET | Freq: Every day | ORAL | 1 refills | Status: DC
Start: 2021-07-07 — End: 2021-10-13

## 2021-07-07 MED ORDER — AMLODIPINE BESYLATE 10 MG PO TABS: 10.0000 mg | ORAL_TABLET | Freq: Every day | ORAL | 1 refills | Status: DC

## 2021-07-07 MED ORDER — DULAGLUTIDE 3 MG/0.5ML ~~LOC~~ SOAJ
3.0000 mg | SUBCUTANEOUS | 5 refills | Status: DC
Start: 2021-07-07 — End: 2021-10-13

## 2021-07-07 MED ORDER — ROSUVASTATIN CALCIUM 20 MG PO TABS: 20.0000 mg | ORAL_TABLET | Freq: Every day | ORAL | 1 refills | Status: DC

## 2021-07-07 NOTE — Progress Notes (Signed)
[facility] [place], [place] [address]  Internal MEDICINE  Office Visit Note  Patient Name: Tammy Beasley  [id number]  [id number]  Date of Service: 07/07/2021  Chief Complaint  Patient presents with   Follow-up   Diabetes   Hypertension    HPI Tammy Beasley presents for a follow up visit for diabetes and hypertension. She has been forgetting to take her medication sometimes. A1C is 8.4 today which is up from 7.1. She is due for colorectal cancer screening and would like to be referred to GI for a colonoscopy. Tammy Beasley's birthday is tomorrow and she plans on spending some time with her nephew. She also became a grandmother for the first time recently and states that she knows she has not been paying that much attention to her diet lately. Her blood pressure is elevated and poorly controlled but she has not taken her medication today and is not consistently taking her medication due to forgetting more often.    Current Medication: Outpatient Encounter Medications as of 07/07/2021  Medication Sig   Accu-Chek Softclix Lancets lancets Use as instructed to check blood sugars twice a day  E11.65   Dulaglutide 3 MG/0.5ML SOPN Inject 3 mg into the skin once a week.   glucose blood (ACCU-CHEK GUIDE) test strip Use as directed to check blood sugars twice a day  E11.65   [DISCONTINUED] amLODipine (NORVASC) 10 MG tablet Take 1 tablet (10 mg total) by mouth daily.   [DISCONTINUED] dapagliflozin propanediol (FARXIGA) 10 MG TABS tablet Take 1 tablet (10 mg total) by mouth daily before breakfast.   [DISCONTINUED] Dulaglutide (TRULICITY) 1.5 GN/5.6OZ SOPN Inject 1.5 mg into the skin once a week.   [DISCONTINUED] ezetimibe (ZETIA) 10 MG tablet Take 1 tablet (10 mg total) by mouth daily.   [DISCONTINUED] hydrochlorothiazide (HYDRODIURIL) 12.5 MG tablet Take 1 tablet (12.5 mg total) by mouth daily.   [DISCONTINUED] rosuvastatin (CRESTOR) 20 MG tablet Take 1 tablet (20 mg total) by  mouth daily.   amLODipine (NORVASC) 10 MG tablet Take 1 tablet (10 mg total) by mouth daily.   dapagliflozin propanediol (FARXIGA) 10 MG TABS tablet Take 1 tablet (10 mg total) by mouth daily before breakfast.   ezetimibe (ZETIA) 10 MG tablet Take 1 tablet (10 mg total) by mouth daily.   hydrochlorothiazide (HYDRODIURIL) 12.5 MG tablet Take 1 tablet (12.5 mg total) by mouth daily.   rosuvastatin (CRESTOR) 20 MG tablet Take 1 tablet (20 mg total) by mouth daily.   No facility-administered encounter medications on file as of 07/07/2021.    Surgical History: Past Surgical History:  Procedure Laterality Date   BREAST LUMPECTOMY     celluilitus      Medical History: Past Medical History:  Diagnosis Date   Diabetes mellitus without complication ([place])    Hypertension     Family History: Family History  Problem Relation Age of Onset   Renal Disease Father    Kidney failure Father    Diabetes Father     Social History   Socioeconomic History   Marital status: Single    Spouse name: Not on file   Number of children: Not on file   Years of education: Not on file   Highest education level: Not on file  Occupational History   Not on file  Tobacco Use   Smoking status: Every Day    Types: Cigarettes   Smokeless tobacco: Never   Tobacco comments:    4 a day try to quit  Substance and Sexual Activity   Alcohol use: Not Currently   Drug use: Never   Sexual activity: Not on file  Other Topics Concern   Not on file  Social History Narrative   Not on file   Social Determinants of Health   Financial Resource Strain: Not on file  Food Insecurity: Not on file  Transportation Needs: Not on file  Physical Activity: Not on file  Stress: Not on file  Social Connections: Not on file  Intimate Partner Violence: Not on file      Review of Systems  Constitutional:  Negative for chills, fatigue and unexpected weight change.  HENT:  Negative for congestion, rhinorrhea, sneezing  and sore throat.   Eyes:  Negative for redness.  Respiratory: Negative.  Negative for cough, chest tightness and shortness of breath.   Cardiovascular: Negative.  Negative for chest pain and palpitations.  Gastrointestinal:  Negative for abdominal pain, constipation, diarrhea, nausea and vomiting.  Genitourinary:  Negative for dysuria and frequency.  Musculoskeletal:  Negative for arthralgias, back pain, joint swelling and neck pain.  Skin:  Negative for rash.  Neurological: Negative.  Negative for tremors and numbness.  Hematological:  Negative for adenopathy. Does not bruise/bleed easily.  Psychiatric/Behavioral:  Negative for behavioral problems (Depression), sleep disturbance and suicidal ideas. The patient is not nervous/anxious.    Vital Signs: BP (!) 150/94   Pulse 77   Temp 97.8 F (36.6 C)   Resp 16   Ht 5\' 4"  (1.626 m)   Wt 230 lb (104.3 kg)   SpO2 97%   BMI 39.48 kg/m    Physical Exam Vitals reviewed.  Constitutional:      General: She is not in acute distress.    Appearance: Normal appearance. She is obese. She is not ill-appearing.  HENT:     Head: Normocephalic and atraumatic.  Eyes:     Extraocular Movements: Extraocular movements intact.     Pupils: Pupils are equal, round, and reactive to light.  Cardiovascular:     Rate and Rhythm: Normal rate and regular rhythm.  Pulmonary:     Effort: Pulmonary effort is normal. No respiratory distress.  Neurological:     Mental Status: She is alert and oriented to person, place, and time.     Cranial Nerves: No cranial nerve deficit.     Coordination: Coordination normal.     Gait: Gait normal.  Psychiatric:        Mood and Affect: Mood normal.        Behavior: Behavior normal.       Assessment/Plan: 1. Type 2 diabetes mellitus with hyperglycemia, with long-term current use of insulin (HCC) A1C is elevated more today at 8.4. she did briefly run out of her trulicity. Trulicity dose increased to help aid in  weight loss as well as controlling her glucose and A1C. Continue farxiga as prescribed.  - POCT glycosylated hemoglobin (Hb A1C) - Dulaglutide 3 MG/0.5ML SOPN; Inject 3 mg into the skin once a week.  Dispense: 2 mL; Refill: 5 - dapagliflozin propanediol (FARXIGA) 10 MG TABS tablet; Take 1 tablet (10 mg total) by mouth daily before breakfast.  Dispense: 30 tablet; Refill: 5  2. Essential hypertension Continue amlodipine and hydrochlorothiazide as prescribed. Patient is having problems with consistently taking her medication due to forgetting.  Counseling: Adherence of Medical Therapy: The patient understands that it is the responsibility of the patient to complete all prescribed medications, all recommended testing, including but not limited to, laboratory studies  and imaging. The patient further understands the need to keep all scheduled follow-up visits and to inform the office immediately of any changes in their medical condition. The patient understands that the success of treatment in large part depends on the patient's willingness to complete the therapeutic regimen and to work in partnership with the designated health-care providers. - hydrochlorothiazide (HYDRODIURIL) 12.5 MG tablet; Take 1 tablet (12.5 mg total) by mouth daily.  Dispense: 90 tablet; Refill: 1 - amLODipine (NORVASC) 10 MG tablet; Take 1 tablet (10 mg total) by mouth daily.  Dispense: 90 tablet; Refill: 1  3. Mixed hyperlipidemia Lipids are significantly elevated, continue medications as prescribed.  - rosuvastatin (CRESTOR) 20 MG tablet; Take 1 tablet (20 mg total) by mouth daily.  Dispense: 90 tablet; Refill: 1 - ezetimibe (ZETIA) 10 MG tablet; Take 1 tablet (10 mg total) by mouth daily.  Dispense: 90 tablet; Refill: 1  4. Class 2 severe obesity due to excess calories with serious comorbidity and body mass index (BMI) of 39.0 to 39.9 in adult [facility]) Obesity Counseling: Risk Assessment: An assessment of behavioral risk  factors was made today and includes lack of exercise sedentary lifestyle, lack of portion control and poor dietary habits.  Risk Modification Advice: She was counseled on portion control guidelines. Important aspects of a diabetic diet are beneficial for weight loss including low carb food choices, low sugar foods, and lean proteins. The detrimental long term effects of obesity on her health and ongoing poor compliance was also discussed with the patient.     General Counseling: makaia rappa understanding of the findings of todays visit and agrees with plan of treatment. I have discussed any further diagnostic evaluation that may be needed or ordered today. We also reviewed her medications today. she has been encouraged to call the office with any questions or concerns that should arise related to todays visit.    Orders Placed This Encounter  Procedures   POCT glycosylated hemoglobin (Hb A1C)    Meds ordered this encounter  Medications   Dulaglutide 3 MG/0.5ML SOPN    Sig: Inject 3 mg into the skin once a week.    Dispense:  2 mL    Refill:  5   dapagliflozin propanediol (FARXIGA) 10 MG TABS tablet    Sig: Take 1 tablet (10 mg total) by mouth daily before breakfast.    Dispense:  30 tablet    Refill:  5   hydrochlorothiazide (HYDRODIURIL) 12.5 MG tablet    Sig: Take 1 tablet (12.5 mg total) by mouth daily.    Dispense:  90 tablet    Refill:  1   rosuvastatin (CRESTOR) 20 MG tablet    Sig: Take 1 tablet (20 mg total) by mouth daily.    Dispense:  90 tablet    Refill:  1   amLODipine (NORVASC) 10 MG tablet    Sig: Take 1 tablet (10 mg total) by mouth daily.    Dispense:  90 tablet    Refill:  1   ezetimibe (ZETIA) 10 MG tablet    Sig: Take 1 tablet (10 mg total) by mouth daily.    Dispense:  90 tablet    Refill:  1     Return in about 3 months (around 10/07/2021) for F/U, Recheck A1C, Elisse Pennick PCP.   Total time spent: 30 Minutes Time spent includes review of chart,  medications, test results, and follow up plan with the patient.   [place] Controlled Substance Database was reviewed by me.  This patient was seen by Jonetta Osgood, FNP-C in collaboration with Dr. Clayborn Bigness as a part of collaborative care agreement.   Hermie Reagor R. Valetta Fuller, MSN, FNP-C Internal medicine

## 2021-07-24 ENCOUNTER — Ambulatory Visit: Payer: BC Managed Care – PPO | Admitting: Dermatology

## 2021-09-29 ENCOUNTER — Ambulatory Visit: Payer: BC Managed Care – PPO | Admitting: Nurse Practitioner

## 2021-09-29 ENCOUNTER — Other Ambulatory Visit: Payer: Self-pay

## 2021-09-29 ENCOUNTER — Encounter: Payer: Self-pay | Admitting: Nurse Practitioner

## 2021-09-29 VITALS — Resp 16 | Ht 64.0 in

## 2021-10-03 ENCOUNTER — Ambulatory Visit: Payer: BC Managed Care – PPO | Admitting: Nurse Practitioner

## 2021-10-13 ENCOUNTER — Encounter: Payer: Self-pay | Admitting: Nurse Practitioner

## 2021-10-13 ENCOUNTER — Telehealth: Payer: Self-pay

## 2021-10-13 ENCOUNTER — Other Ambulatory Visit: Payer: Self-pay

## 2021-10-13 ENCOUNTER — Ambulatory Visit: Payer: BC Managed Care – PPO | Admitting: Nurse Practitioner

## 2021-10-13 VITALS — BP 130/80 | HR 80 | Temp 98.1°F | Resp 16 | Ht 64.0 in | Wt 229.0 lb

## 2021-10-13 DIAGNOSIS — E782 Mixed hyperlipidemia: Secondary | ICD-10-CM

## 2021-10-13 DIAGNOSIS — R300 Dysuria: Secondary | ICD-10-CM

## 2021-10-13 DIAGNOSIS — Z794 Long term (current) use of insulin: Secondary | ICD-10-CM

## 2021-10-13 DIAGNOSIS — E1165 Type 2 diabetes mellitus with hyperglycemia: Secondary | ICD-10-CM | POA: Diagnosis not present

## 2021-10-13 DIAGNOSIS — I10 Essential (primary) hypertension: Secondary | ICD-10-CM

## 2021-10-13 DIAGNOSIS — N3000 Acute cystitis without hematuria: Secondary | ICD-10-CM | POA: Diagnosis not present

## 2021-10-13 LAB — POCT URINALYSIS DIPSTICK
Bilirubin, UA: NEGATIVE
Glucose, UA: NEGATIVE
Nitrite, UA: POSITIVE
Protein, UA: POSITIVE — AB
Spec Grav, UA: 1.025 (ref 1.010–1.025)
Urobilinogen, UA: 0.2 E.U./dL
pH, UA: 5 (ref 5.0–8.0)

## 2021-10-13 LAB — POCT GLYCOSYLATED HEMOGLOBIN (HGB A1C): Hemoglobin A1C: 8.5 % — AB (ref 4.0–5.6)

## 2021-10-13 MED ORDER — DULAGLUTIDE 3 MG/0.5ML ~~LOC~~ SOAJ: 3.0000 mg | SUBCUTANEOUS | 5 refills | Status: DC

## 2021-10-13 MED ORDER — ROSUVASTATIN CALCIUM 20 MG PO TABS: 20.0000 mg | ORAL_TABLET | Freq: Every day | ORAL | 1 refills | Status: DC

## 2021-10-13 MED ORDER — NITROFURANTOIN MONOHYD MACRO 100 MG PO CAPS: 100.0000 mg | ORAL_CAPSULE | Freq: Two times a day (BID) | ORAL | 0 refills | Status: AC

## 2021-10-13 MED ORDER — DAPAGLIFLOZIN PROPANEDIOL 10 MG PO TABS: 10.0000 mg | ORAL_TABLET | Freq: Every day | ORAL | 5 refills | Status: DC

## 2021-10-13 MED ORDER — ACCU-CHEK SOFTCLIX LANCETS MISC: 12 refills | Status: DC

## 2021-10-13 MED ORDER — AMLODIPINE BESYLATE 10 MG PO TABS: 10.0000 mg | ORAL_TABLET | Freq: Every day | ORAL | 1 refills | Status: DC

## 2021-10-13 MED ORDER — EZETIMIBE 10 MG PO TABS: 10.0000 mg | ORAL_TABLET | Freq: Every day | ORAL | 1 refills | Status: DC

## 2021-10-13 MED ORDER — ACCU-CHEK GUIDE VI STRP: ORAL_STRIP | 12 refills | Status: DC

## 2021-10-13 MED ORDER — HYDROCHLOROTHIAZIDE 12.5 MG PO TABS: 12.5000 mg | ORAL_TABLET | Freq: Every day | ORAL | 1 refills | Status: DC

## 2021-10-13 NOTE — Progress Notes (Signed)
[facility] [place], [place] [address]  Internal MEDICINE  Office Visit Note  Patient Name: Tammy Beasley  [id number]  [id number]  Date of Service: 10/13/2021  Chief Complaint  Patient presents with   Follow-up   Diabetes   Hypertension   Urinary Tract Infection    Frequency and urgency, discomfort     HPI Tammy Beasley presents for a follow-up visit for diabetes and hypertension. Her blood pressure is well controlled today with current medications. She reports compliance with medication regimen but that her diet was not ideal over the holidays. Her A1C is 8.5 today with is up by 0.1 from October last year.  She also increased urinary frequency and urgency and some urinary discomfort and burning with urination. She thinks she may be developing a UTI.    Current Medication: Outpatient Encounter Medications as of 10/13/2021  Medication Sig   nitrofurantoin, macrocrystal-monohydrate, (MACROBID) 100 MG capsule Take 1 capsule (100 mg total) by mouth 2 (two) times daily for 7 days. Take with food   [DISCONTINUED] Accu-Chek Softclix Lancets lancets Use as instructed to check blood sugars twice a day  E11.65   [DISCONTINUED] amLODipine (NORVASC) 10 MG tablet Take 1 tablet (10 mg total) by mouth daily.   [DISCONTINUED] dapagliflozin propanediol (FARXIGA) 10 MG TABS tablet Take 1 tablet (10 mg total) by mouth daily before breakfast.   [DISCONTINUED] Dulaglutide 3 MG/0.5ML SOPN Inject 3 mg into the skin once a week.   [DISCONTINUED] ezetimibe (ZETIA) 10 MG tablet Take 1 tablet (10 mg total) by mouth daily.   [DISCONTINUED] glucose blood (ACCU-CHEK GUIDE) test strip Use as directed to check blood sugars twice a day  E11.65   [DISCONTINUED] hydrochlorothiazide (HYDRODIURIL) 12.5 MG tablet Take 1 tablet (12.5 mg total) by mouth daily.   [DISCONTINUED] rosuvastatin (CRESTOR) 20 MG tablet Take 1 tablet (20 mg total) by mouth daily.   Accu-Chek Softclix Lancets lancets Use  as instructed to check blood sugars twice a day  E11.65   amLODipine (NORVASC) 10 MG tablet Take 1 tablet (10 mg total) by mouth daily.   dapagliflozin propanediol (FARXIGA) 10 MG TABS tablet Take 1 tablet (10 mg total) by mouth daily before breakfast.   Dulaglutide 3 MG/0.5ML SOPN Inject 3 mg into the skin once a week.   ezetimibe (ZETIA) 10 MG tablet Take 1 tablet (10 mg total) by mouth daily.   glucose blood (ACCU-CHEK GUIDE) test strip Use as directed to check blood sugars twice a day  E11.65   hydrochlorothiazide (HYDRODIURIL) 12.5 MG tablet Take 1 tablet (12.5 mg total) by mouth daily.   rosuvastatin (CRESTOR) 20 MG tablet Take 1 tablet (20 mg total) by mouth daily.   No facility-administered encounter medications on file as of 10/13/2021.    Surgical History: Past Surgical History:  Procedure Laterality Date   BREAST LUMPECTOMY     celluilitus      Medical History: Past Medical History:  Diagnosis Date   Diabetes mellitus without complication ([place])    Hypertension     Family History: Family History  Problem Relation Age of Onset   Renal Disease Father    Kidney failure Father    Diabetes Father     Social History   Socioeconomic History   Marital status: Single    Spouse name: Not on file   Number of children: Not on file   Years of education: Not on file   Highest education level: Not on file  Occupational History  Not on file  Tobacco Use   Smoking status: Every Day    Types: Cigarettes   Smokeless tobacco: Never   Tobacco comments:    4 a day try to quit  Substance and Sexual Activity   Alcohol use: Not Currently   Drug use: Never   Sexual activity: Not on file  Other Topics Concern   Not on file  Social History Narrative   Not on file   Social Determinants of Health   Financial Resource Strain: Not on file  Food Insecurity: Not on file  Transportation Needs: Not on file  Physical Activity: Not on file  Stress: Not on file  Social  Connections: Not on file  Intimate Partner Violence: Not on file      Review of Systems  Constitutional:  Negative for chills, fatigue, fever and unexpected weight change.  HENT:  Negative for congestion, rhinorrhea, sneezing and sore throat.   Eyes:  Negative for redness.  Respiratory: Negative.  Negative for cough, chest tightness, shortness of breath and wheezing.   Cardiovascular: Negative.  Negative for chest pain and palpitations.  Gastrointestinal: Negative.  Negative for abdominal pain, constipation, diarrhea, nausea and vomiting.  Genitourinary:  Positive for dysuria, frequency and urgency. Negative for difficulty urinating, flank pain and pelvic pain.  Musculoskeletal:  Negative for arthralgias, back pain, joint swelling and neck pain.  Skin:  Negative for rash.  Neurological: Negative.  Negative for tremors and numbness.  Hematological:  Negative for adenopathy. Does not bruise/bleed easily.  Psychiatric/Behavioral:  Negative for behavioral problems (Depression), sleep disturbance and suicidal ideas. The patient is not nervous/anxious.    Vital Signs: BP 130/80    Pulse 80    Temp 98.1 F (36.7 C)    Resp 16    Ht 5\' 4"  (1.626 m)    Wt 229 lb (103.9 kg)    SpO2 98%    BMI 39.31 kg/m    Physical Exam Vitals reviewed.  Constitutional:      Appearance: Normal appearance. She is obese.  HENT:     Head: Normocephalic and atraumatic.  Eyes:     Pupils: Pupils are equal, round, and reactive to light.  Cardiovascular:     Rate and Rhythm: Normal rate and regular rhythm.  Pulmonary:     Effort: Pulmonary effort is normal. No respiratory distress.  Neurological:     Mental Status: She is alert and oriented to person, place, and time.     Cranial Nerves: No cranial nerve deficit.     Coordination: Coordination normal.     Gait: Gait normal.  Psychiatric:        Mood and Affect: Mood normal.        Behavior: Behavior normal.       Assessment/Plan: 1. Type 2 diabetes  mellitus with hyperglycemia, with long-term current use of insulin ([place]) Patient wants to work on diet modifications and has declined any medication dose adjustments. Repeat A1C in 3 months.  - POCT HgB A1C - Dulaglutide 3 MG/0.5ML SOPN; Inject 3 mg into the skin once a week.  Dispense: 2 mL; Refill: 5 - dapagliflozin propanediol (FARXIGA) 10 MG TABS tablet; Take 1 tablet (10 mg total) by mouth daily before breakfast.  Dispense: 30 tablet; Refill: 5 - glucose blood (ACCU-CHEK GUIDE) test strip; Use as directed to check blood sugars twice a day  E11.65  Dispense: 100 each; Refill: 12 - Accu-Chek Softclix Lancets lancets; Use as instructed to check blood sugars twice a day  E11.65  Dispense: 100 each; Refill: 12  2. Acute cystitis without hematuria Empiric antibiotic treatment prescribed - nitrofurantoin, macrocrystal-monohydrate, (MACROBID) 100 MG capsule; Take 1 capsule (100 mg total) by mouth 2 (two) times daily for 7 days. Take with food  Dispense: 14 capsule; Refill: 0  3. Essential hypertension BP well controlled with current medications, patient is taking medications consistently - hydrochlorothiazide (HYDRODIURIL) 12.5 MG tablet; Take 1 tablet (12.5 mg total) by mouth daily.  Dispense: 90 tablet; Refill: 1 - amLODipine (NORVASC) 10 MG tablet; Take 1 tablet (10 mg total) by mouth daily.  Dispense: 90 tablet; Refill: 1  4. Mixed hyperlipidemia Stable, refills ordered - ezetimibe (ZETIA) 10 MG tablet; Take 1 tablet (10 mg total) by mouth daily.  Dispense: 90 tablet; Refill: 1 - rosuvastatin (CRESTOR) 20 MG tablet; Take 1 tablet (20 mg total) by mouth daily.  Dispense: 90 tablet; Refill: 1  5. Dysuria Urinalysis positive for nitrites and leukocytes - POCT Urinalysis Dipstick - CULTURE, URINE COMPREHENSIVE   General Counseling: Miaisabella verbalizes understanding of the findings of todays visit and agrees with plan of treatment. I have discussed any further diagnostic evaluation that may be  needed or ordered today. We also reviewed her medications today. she has been encouraged to call the office with any questions or concerns that should arise related to todays visit.    Orders Placed This Encounter  Procedures   CULTURE, URINE COMPREHENSIVE   POCT HgB A1C   POCT Urinalysis Dipstick    Meds ordered this encounter  Medications   Dulaglutide 3 MG/0.5ML SOPN    Sig: Inject 3 mg into the skin once a week.    Dispense:  2 mL    Refill:  5   hydrochlorothiazide (HYDRODIURIL) 12.5 MG tablet    Sig: Take 1 tablet (12.5 mg total) by mouth daily.    Dispense:  90 tablet    Refill:  1   dapagliflozin propanediol (FARXIGA) 10 MG TABS tablet    Sig: Take 1 tablet (10 mg total) by mouth daily before breakfast.    Dispense:  30 tablet    Refill:  5   ezetimibe (ZETIA) 10 MG tablet    Sig: Take 1 tablet (10 mg total) by mouth daily.    Dispense:  90 tablet    Refill:  1   amLODipine (NORVASC) 10 MG tablet    Sig: Take 1 tablet (10 mg total) by mouth daily.    Dispense:  90 tablet    Refill:  1   glucose blood (ACCU-CHEK GUIDE) test strip    Sig: Use as directed to check blood sugars twice a day  E11.65    Dispense:  100 each    Refill:  12   Accu-Chek Softclix Lancets lancets    Sig: Use as instructed to check blood sugars twice a day  E11.65    Dispense:  100 each    Refill:  12   rosuvastatin (CRESTOR) 20 MG tablet    Sig: Take 1 tablet (20 mg total) by mouth daily.    Dispense:  90 tablet    Refill:  1   nitrofurantoin, macrocrystal-monohydrate, (MACROBID) 100 MG capsule    Sig: Take 1 capsule (100 mg total) by mouth 2 (two) times daily for 7 days. Take with food    Dispense:  14 capsule    Refill:  0    Please fill today    Return in about 3 months (around 01/11/2022) for F/U, Recheck A1C,  Yena Tisby PCP.   Total time spent:30 Minutes Time spent includes review of chart, medications, test results, and follow up plan with the patient.   [place] Controlled Substance  Database was reviewed by me.  This patient was seen by Jonetta Osgood, FNP-C in collaboration with Dr. Clayborn Bigness as a part of collaborative care agreement.   Boykin Baetz R. Valetta Fuller, MSN, FNP-C Internal medicine

## 2021-10-13 NOTE — Telephone Encounter (Signed)
LMOM advising pt that she has a UTI and that Alyssa sent macrobid to her pharmacy.  Informed to call us back if any questions

## 2021-10-13 NOTE — Telephone Encounter (Signed)
-----   Message from Jonetta Osgood, NP sent at 10/13/2021 12:33 PM EST ----- Please call patient and let her know that she has a UTI and I sent an antibiotic to her pharmacy

## 2021-10-13 NOTE — Progress Notes (Signed)
Please call patient and let her know that she has a UTI and I sent an antibiotic to her pharmacy

## 2021-10-18 LAB — CULTURE, URINE COMPREHENSIVE

## 2021-10-29 NOTE — Progress Notes (Signed)
Appt cancelled due to exposure to covid

## 2021-11-27 ENCOUNTER — Other Ambulatory Visit: Payer: Self-pay

## 2021-11-27 ENCOUNTER — Ambulatory Visit (INDEPENDENT_AMBULATORY_CARE_PROVIDER_SITE_OTHER): Payer: BC Managed Care – PPO | Admitting: Nurse Practitioner

## 2021-11-27 ENCOUNTER — Encounter: Payer: Self-pay | Admitting: Nurse Practitioner

## 2021-11-27 VITALS — BP 130/80 | HR 95 | Temp 98.4°F | Resp 16 | Ht 64.0 in | Wt 225.0 lb

## 2021-11-27 DIAGNOSIS — L232 Allergic contact dermatitis due to cosmetics: Secondary | ICD-10-CM

## 2021-11-27 MED ORDER — TRIAMCINOLONE ACETONIDE 0.5 % EX OINT: 1.0000 "application " | TOPICAL_OINTMENT | Freq: Two times a day (BID) | CUTANEOUS | 1 refills | Status: DC

## 2021-11-27 MED ORDER — PREDNISONE 10 MG PO TABS: ORAL_TABLET | ORAL | 0 refills | Status: DC

## 2021-11-27 NOTE — Progress Notes (Signed)
[facility] [place], [place] [address]  Internal MEDICINE  Office Visit Note  Patient Name: Tammy Beasley  [id number]  [id number]  Date of Service: 11/27/2021  Chief Complaint  Patient presents with   Acute Visit    Pt used new hair product and when it started causing irritation she washed it out and her temples were swollen, eyes are swollen, above brow is swollen, worse on left side per pt but right side currently looks more puffy    Rash     HPI Tammy Beasley presents for an acute sick visit for possible allergic reaction to a hair care product.  She started using a product called [employer] [place] beeswax to help smooth down her hair when she has it pulled back but it started causing irritation and burning of the scalp so after a couple of days she washed it out.  After washing the product out of her hair in the shower, she noticed that she had swelling around her eyes and around her temple and in her hairline.  She also noticed that she had areas of irritation that were red.  She also reports that she was having a lot of itching on her face and in her hairline.  She has been taking children's Benadryl and applying topical cortisone cream which did help some but the relief was only temporary.  She is worried that the swelling will progress down her face into her throat.    Current Medication:  Outpatient Encounter Medications as of 11/27/2021  Medication Sig   Accu-Chek Softclix Lancets lancets Use as instructed to check blood sugars twice a day  E11.65   amLODipine (NORVASC) 10 MG tablet Take 1 tablet (10 mg total) by mouth daily.   dapagliflozin propanediol (FARXIGA) 10 MG TABS tablet Take 1 tablet (10 mg total) by mouth daily before breakfast.   Dulaglutide 3 MG/0.5ML SOPN Inject 3 mg into the skin once a week.   ezetimibe (ZETIA) 10 MG tablet Take 1 tablet (10 mg total) by mouth daily.   glucose blood (ACCU-CHEK GUIDE) test strip Use as directed to  check blood sugars twice a day  E11.65   hydrochlorothiazide (HYDRODIURIL) 12.5 MG tablet Take 1 tablet (12.5 mg total) by mouth daily.   predniSONE (DELTASONE) 10 MG tablet Take one tab 3 x day for 3 days, then take one tab 2 x a day for 3 days and then take one tab a day for 3 days for allergic contact dermatitis   rosuvastatin (CRESTOR) 20 MG tablet Take 1 tablet (20 mg total) by mouth daily.   triamcinolone ointment (KENALOG) 0.5 % Apply 1 application topically 2 (two) times daily.   No facility-administered encounter medications on file as of 11/27/2021.      Medical History: Past Medical History:  Diagnosis Date   Diabetes mellitus without complication ([place])    Hypertension      Vital Signs: BP 130/80 Comment: 152/88   Pulse 95    Temp 98.4 F (36.9 C)    Resp 16    Ht 5\' 4"  (1.626 m)    Wt 225 lb (102.1 kg)    SpO2 97%    BMI 38.62 kg/m    Review of Systems  Respiratory: Negative.  Negative for cough, choking, chest tightness, shortness of breath, wheezing and stridor.   Cardiovascular: Negative.  Negative for chest pain and palpitations.  Gastrointestinal: Negative.   Musculoskeletal: Negative.   Skin:  Swelling, redness and itching on her face, hairline and scalp  Allergic/Immunologic:       Allergic reaction to a hair care product  Psychiatric/Behavioral: Negative.     Physical Exam Vitals reviewed.  Constitutional:      General: She is not in acute distress.    Appearance: Normal appearance. She is obese. She is ill-appearing.  HENT:     Head: Normocephalic and atraumatic.  Eyes:     General: Vision grossly intact. Gaze aligned appropriately.     Pupils: Pupils are equal, round, and reactive to light.     Comments: Periorbital edema bilaterally  Cardiovascular:     Rate and Rhythm: Normal rate and regular rhythm.  Pulmonary:     Effort: Pulmonary effort is normal. No accessory muscle usage or respiratory distress.     Breath sounds: Normal air entry.   Skin:    Findings: Erythema (facial, at hairline, temples and around her eyes and nose) present.     Comments: Swelling of her nose and the sides of her face is noticeable  Neurological:     Mental Status: She is alert and oriented to person, place, and time.  Psychiatric:        Mood and Affect: Mood normal.        Behavior: Behavior normal.      Assessment/Plan: 1. Allergic contact dermatitis due to cosmetics Probably allergy to an ingredient in [employer].  The 2 most abundant ingredients in this product is beeswax and fragrance which are both very common skin allergies.  Patient will no longer use the product.  She is worried that she will end up having an anaphylactic reaction but she was initially exposed to the product over 24 hours ago so an anaphylactic reaction is not likely.  Prednisone taper prescribed to decrease swelling and inflammation of the face and triamcinolone ointment prescribed to help decrease itching and inflammation.  Patient instructed to take 1/2 tablet of regular Benadryl 25 mg tablet over-the-counter instead of children's Benadryl.  Patient is agreeable with plan.  It may also be helpful to consider additional allergy testing in office or ordering labs for additional allergy testing.  Patient encouraged to call clinic if she does not notice any improvement in the redness, swelling and itching of her face within the next 3 days.  Patient also instructed to go to the ER or call 911 if she is having difficulty breathing. Beeswax is now listed as an allergy. May need to do additional testing to figure out which ingredient she was reacting to.  - predniSONE (DELTASONE) 10 MG tablet; Take one tab 3 x day for 3 days, then take one tab 2 x a day for 3 days and then take one tab a day for 3 days for allergic contact dermatitis  Dispense: 18 tablet; Refill: 0 - triamcinolone ointment (KENALOG) 0.5 %; Apply 1 application topically 2 (two) times daily.  Dispense:  45 g; Refill: 1   General Counseling: Tammy Beasley verbalizes understanding of the findings of todays visit and agrees with plan of treatment. I have discussed any further diagnostic evaluation that may be needed or ordered today. We also reviewed her medications today. she has been encouraged to call the office with any questions or concerns that should arise related to todays visit.    Counseling:    No orders of the defined types were placed in this encounter.   Meds ordered this encounter  Medications   predniSONE (DELTASONE) 10 MG tablet  Sig: Take one tab 3 x day for 3 days, then take one tab 2 x a day for 3 days and then take one tab a day for 3 days for allergic contact dermatitis    Dispense:  18 tablet    Refill:  0   triamcinolone ointment (KENALOG) 0.5 %    Sig: Apply 1 application topically 2 (two) times daily.    Dispense:  45 g    Refill:  1    Return if symptoms worsen or fail to improve.  [place] Controlled Substance Database was reviewed by me for overdose risk score (ORS)  Time spent:20 Minutes Time spent with patient included reviewing progress notes, labs, imaging studies, and discussing plan for follow up.   This patient was seen by Jonetta Osgood, FNP-C in collaboration with Dr. Clayborn Bigness as a part of collaborative care agreement.  Braylyn Kalter R. Valetta Fuller, MSN, FNP-C Internal Medicine

## 2021-12-25 ENCOUNTER — Other Ambulatory Visit: Payer: Self-pay

## 2021-12-25 ENCOUNTER — Ambulatory Visit (INDEPENDENT_AMBULATORY_CARE_PROVIDER_SITE_OTHER): Payer: BC Managed Care – PPO | Admitting: Physician Assistant

## 2021-12-25 ENCOUNTER — Encounter: Payer: Self-pay | Admitting: Physician Assistant

## 2021-12-25 DIAGNOSIS — J4520 Mild intermittent asthma, uncomplicated: Secondary | ICD-10-CM

## 2021-12-25 DIAGNOSIS — Z0001 Encounter for general adult medical examination with abnormal findings: Secondary | ICD-10-CM

## 2021-12-25 DIAGNOSIS — R5383 Other fatigue: Secondary | ICD-10-CM

## 2021-12-25 DIAGNOSIS — E782 Mixed hyperlipidemia: Secondary | ICD-10-CM

## 2021-12-25 DIAGNOSIS — I10 Essential (primary) hypertension: Secondary | ICD-10-CM | POA: Diagnosis not present

## 2021-12-25 DIAGNOSIS — B351 Tinea unguium: Secondary | ICD-10-CM

## 2021-12-25 DIAGNOSIS — R300 Dysuria: Secondary | ICD-10-CM

## 2021-12-25 DIAGNOSIS — Z01419 Encounter for gynecological examination (general) (routine) without abnormal findings: Secondary | ICD-10-CM

## 2021-12-25 DIAGNOSIS — R7989 Other specified abnormal findings of blood chemistry: Secondary | ICD-10-CM

## 2021-12-25 DIAGNOSIS — Z1231 Encounter for screening mammogram for malignant neoplasm of breast: Secondary | ICD-10-CM | POA: Diagnosis not present

## 2021-12-25 DIAGNOSIS — Z1211 Encounter for screening for malignant neoplasm of colon: Secondary | ICD-10-CM | POA: Diagnosis not present

## 2021-12-25 DIAGNOSIS — E1165 Type 2 diabetes mellitus with hyperglycemia: Secondary | ICD-10-CM | POA: Diagnosis not present

## 2021-12-25 DIAGNOSIS — Z1212 Encounter for screening for malignant neoplasm of rectum: Secondary | ICD-10-CM

## 2021-12-25 MED ORDER — HYDROCHLOROTHIAZIDE 25 MG PO TABS: 25.0000 mg | ORAL_TABLET | Freq: Every day | ORAL | 3 refills | Status: DC

## 2021-12-25 MED ORDER — ALBUTEROL SULFATE HFA 108 (90 BASE) MCG/ACT IN AERS: 2.0000 | INHALATION_SPRAY | Freq: Four times a day (QID) | RESPIRATORY_TRACT | 0 refills | Status: DC | PRN

## 2021-12-25 NOTE — Progress Notes (Signed)
?[facility] ?[address] ?[place], [place] [address] ? ?Internal MEDICINE  ?Office Visit Note ? ?Patient Name: Tammy Beasley ? [id number]  ?[id number] ? ?Date of Service: 12/25/2021 ? ?Chief Complaint  ?Patient presents with  ? Annual Exam  ? Diabetes  ? Hypertension  ? Quality Metric Gaps  ?  Colonoscopy and Mammogram  ? ? ? ?HPI ?Pt is here for routine health maintenance examination ?-BG fasting 120, evening reading one time was 200, but this is not the norm. Took a little novalog to help because she had some left over, but normally doesn't use this. ?-Takes trulicity and [place] ?-BP fluctuating 130-160/80-90, discussed increasing BP meds. Considered lisinopril due to diabetes however pt not willing due to seeing her husband's severe allergic reaction to ACEI. Will start by increase HCTZ instead but may need to re-visit ACEI/ARB ?-Working to start exercising to work on weight loss goals ?-Other goal is to stop smoking completely, when working wont smoke during the day, wants them when she gets home, about 5-6cigs per day. ?-during spring states she will find herself wheezing, does have hx of mild asthma and would like albuterol inhaler to have on hand in case of flare. Discussed starting antihistamine as well ?-Due for colonoscopy, wants to plan for June ?-Due for mammogram ?-Due for eye exam, foot exam done in office today ? ?Current Medication: ?Outpatient Encounter Medications as of 12/25/2021  ?Medication Sig  ? Accu-Chek Softclix Lancets lancets Use as instructed to check blood sugars twice a day  E11.65  ? albuterol (VENTOLIN HFA) 108 (90 Base) MCG/ACT inhaler Inhale 2 puffs into the lungs every 6 (six) hours as needed for wheezing or shortness of breath.  ? amLODipine (NORVASC) 10 MG tablet Take 1 tablet (10 mg total) by mouth daily.  ? dapagliflozin propanediol (FARXIGA) 10 MG TABS tablet Take 1 tablet (10 mg total) by mouth daily before breakfast.  ? Dulaglutide 3 MG/0.5ML SOPN Inject 3  mg into the skin once a week.  ? ezetimibe (ZETIA) 10 MG tablet Take 1 tablet (10 mg total) by mouth daily.  ? glucose blood (ACCU-CHEK GUIDE) test strip Use as directed to check blood sugars twice a day  E11.65  ? hydrochlorothiazide (HYDRODIURIL) 25 MG tablet Take 1 tablet (25 mg total) by mouth daily.  ? predniSONE (DELTASONE) 10 MG tablet Take one tab 3 x day for 3 days, then take one tab 2 x a day for 3 days and then take one tab a day for 3 days for allergic contact dermatitis  ? rosuvastatin (CRESTOR) 20 MG tablet Take 1 tablet (20 mg total) by mouth daily.  ? triamcinolone ointment (KENALOG) 0.5 % Apply 1 application topically 2 (two) times daily.  ? [DISCONTINUED] hydrochlorothiazide (HYDRODIURIL) 12.5 MG tablet Take 1 tablet (12.5 mg total) by mouth daily.  ? ?No facility-administered encounter medications on file as of 12/25/2021.  ? ? ?Surgical History: ?Past Surgical History:  ?Procedure Laterality Date  ? BREAST LUMPECTOMY    ? celluilitus    ? ? ?Medical History: ?Past Medical History:  ?Diagnosis Date  ? Diabetes mellitus without complication ([place])   ? Hypertension   ? ? ?Family History: ?Family History  ?Problem Relation Age of Onset  ? Renal Disease Father   ? Kidney failure Father   ? Diabetes Father   ? ? ? ? ?Review of Systems  ?Constitutional:  Negative for chills, fatigue and unexpected weight change.  ?HENT:  Negative for congestion, rhinorrhea, sneezing and  sore throat.   ?Eyes:  Negative for redness.  ?Respiratory: Negative.  Negative for cough, chest tightness and shortness of breath.   ?Cardiovascular: Negative.  Negative for chest pain and palpitations.  ?Gastrointestinal:  Negative for abdominal pain, constipation, diarrhea, nausea and vomiting.  ?Genitourinary:  Negative for dysuria and frequency.  ?Musculoskeletal:  Negative for arthralgias, back pain, joint swelling and neck pain.  ?Skin:  Negative for rash.  ?Neurological: Negative.  Negative for tremors and numbness.   ?Hematological:  Negative for adenopathy. Does not bruise/bleed easily.  ?Psychiatric/Behavioral:  Negative for behavioral problems (Depression), sleep disturbance and suicidal ideas. The patient is not nervous/anxious.   ? ? ?Vital Signs: ?BP (!) 144/86 Comment: 158/94  Pulse 72   Temp 98.1 ?F (36.7 ?C)   Resp 16   Ht '5\' 4"'$  (1.626 m)   Wt 229 lb 9.6 oz (104.1 kg)   SpO2 98%   BMI 39.41 kg/m?  ? ? ?Physical Exam ?Vitals and nursing note reviewed.  ?Constitutional:   ?   General: She is not in acute distress. ?   Appearance: Normal appearance. She is obese. She is not ill-appearing.  ?HENT:  ?   Head: Normocephalic and atraumatic.  ?Eyes:  ?   Extraocular Movements: Extraocular movements intact.  ?   Pupils: Pupils are equal, round, and reactive to light.  ?Cardiovascular:  ?   Rate and Rhythm: Normal rate and regular rhythm.  ?   Pulses:     ?     Dorsalis pedis pulses are 3+ on the right side and 3+ on the left side.  ?     Posterior tibial pulses are 3+ on the right side and 3+ on the left side.  ?Pulmonary:  ?   Effort: Pulmonary effort is normal. No respiratory distress.  ?Chest:  ?   Chest wall: No tenderness.  ?Breasts: ?   Right: Normal. No mass.  ?   Left: Normal. No mass.  ?Abdominal:  ?   General: Abdomen is flat. Bowel sounds are normal.  ?   Palpations: Abdomen is soft.  ?   Tenderness: There is no abdominal tenderness.  ?Musculoskeletal:     ?   General: Normal range of motion.  ?   Right foot: Normal range of motion.  ?   Left foot: Normal range of motion.  ?Feet:  ?   Right foot:  ?   Protective Sensation: 2 sites tested.  2 sites sensed.  ?   Skin integrity: Dry skin present.  ?   Toenail Condition: Right toenails are abnormally thick. Fungal disease present. ?   Left foot:  ?   Protective Sensation: 2 sites tested.  2 sites sensed.  ?   Skin integrity: Dry skin present.  ?   Toenail Condition: Left toenails are abnormally thick. Fungal disease present. ?Skin: ?   General: Skin is warm and  dry.  ?Neurological:  ?   Mental Status: She is alert and oriented to person, place, and time.  ?   Cranial Nerves: No cranial nerve deficit.  ?   Coordination: Coordination normal.  ?   Gait: Gait normal.  ?Psychiatric:     ?   Mood and Affect: Mood normal.     ?   Behavior: Behavior normal.  ? ? ? ?LABS: ?Recent Results (from the past 2160 hour(s))  ?POCT HgB A1C     Status: Abnormal  ? Collection Time: 10/13/21  8:58 AM  ?Result Value Ref Range  ?  Hemoglobin A1C 8.5 (A) 4.0 - 5.6 %  ? HbA1c POC (<> result, manual entry)    ? HbA1c, POC (prediabetic range)    ? HbA1c, POC (controlled diabetic range)    ?POCT Urinalysis Dipstick     Status: Abnormal  ? Collection Time: 10/13/21  9:25 AM  ?Result Value Ref Range  ? Color, UA    ? Clarity, UA    ? Glucose, UA Negative Negative  ? Bilirubin, UA neg   ? Ketones, UA trace   ? Spec Grav, UA 1.025 1.010 - 1.025  ? Blood, UA trace   ? pH, UA 5.0 5.0 - 8.0  ? Protein, UA Positive (A) Negative  ? Urobilinogen, UA 0.2 0.2 or 1.0 E.U./dL  ? Nitrite, UA positive   ? Leukocytes, UA Small (1+) (A) Negative  ? Appearance    ? Odor    ?CULTURE, URINE COMPREHENSIVE     Status: Abnormal  ? Collection Time: 10/13/21 11:11 AM  ? Specimen: Urine  ? Urine  ?Result Value Ref Range  ? Urine Culture, Comprehensive Final report (A)   ? Organism ID, Bacteria Escherichia coli (A)   ?  Comment: Cefazolin <=4 ug/mL ?Cefazolin with an MIC <=16 predicts susceptibility to the oral agents ?cefaclor, cefdinir, cefpodoxime, cefprozil, cefuroxime, cephalexin, ?and loracarbef when used for therapy of uncomplicated urinary tract ?infections due to E. coli, Klebsiella pneumoniae, and Proteus ?mirabilis. ?Greater than 100,000 colony forming units per mL ?  ? ANTIMICROBIAL SUSCEPTIBILITY Comment   ?  Comment:       ** S = Susceptible; I = Intermediate; R = Resistant ** ?                   P = Positive; N = Negative ?            MICS are expressed in micrograms per mL ?   Antibiotic                 RSLT#1     RSLT#2    RSLT#3    RSLT#4 ?Amoxicillin/Clavulanic Acid    S ?Ampicillin                     S ?Cefepime                       S ?Ceftriaxone                    S ?Cefuroxime                     S ?Ciprofloxacin

## 2021-12-27 ENCOUNTER — Other Ambulatory Visit: Payer: Self-pay

## 2021-12-27 DIAGNOSIS — Z1211 Encounter for screening for malignant neoplasm of colon: Secondary | ICD-10-CM

## 2021-12-27 LAB — UA/M W/RFLX CULTURE, ROUTINE
Bilirubin, UA: NEGATIVE
Ketones, UA: NEGATIVE
Nitrite, UA: NEGATIVE
Protein,UA: NEGATIVE
RBC, UA: NEGATIVE
Specific Gravity, UA: 1.017 (ref 1.005–1.030)
Urobilinogen, Ur: 0.2 mg/dL (ref 0.2–1.0)
pH, UA: 5 (ref 5.0–7.5)

## 2021-12-27 LAB — MICROSCOPIC EXAMINATION
Casts: NONE SEEN /lpf
Epithelial Cells (non renal): >10 /hpf — AB (ref 0–10)
RBC, Urine: NONE SEEN /hpf (ref 0–2)

## 2021-12-27 LAB — URINE CULTURE, REFLEX

## 2021-12-27 MED ORDER — NA SULFATE-K SULFATE-MG SULF 17.5-3.13-1.6 GM/177ML PO SOLN: 1.0000 | Freq: Once | ORAL | 0 refills | Status: AC

## 2021-12-27 NOTE — Progress Notes (Signed)
Gastroenterology Pre-Procedure Review ? ?Request Date: 02/23/2022 ?Requesting Physician: Dr. Marius Ditch ? ?PATIENT REVIEW QUESTIONS: The patient responded to the following health history questions as indicated:   ? ?1. Are you having any GI issues? no ?2. Do you have a personal history of Polyps? no ?3. Do you have a family history of Colon Cancer or Polyps? yes (colon cancer) ?4. Diabetes Mellitus? yes (type 2 ) ?5. Joint replacements in the past 12 months?no ?6. Major health problems in the past 3 months?no ?7. Any artificial heart valves, MVP, or defibrillator?no ?   ?MEDICATIONS & ALLERGIES:    ?Patient reports the following regarding taking any anticoagulation/antiplatelet therapy:   ?Plavix, Coumadin, Eliquis, Xarelto, Lovenox, Pradaxa, Brilinta, or Effient? no ?Aspirin? no ? ?Patient confirms/reports the following medications:  ?Current Outpatient Medications  ?Medication Sig Dispense Refill  ? Accu-Chek Softclix Lancets lancets Use as instructed to check blood sugars twice a day  E11.65 100 each 12  ? albuterol (VENTOLIN HFA) 108 (90 Base) MCG/ACT inhaler Inhale 2 puffs into the lungs every 6 (six) hours as needed for wheezing or shortness of breath. 8 g 0  ? amLODipine (NORVASC) 10 MG tablet Take 1 tablet (10 mg total) by mouth daily. 90 tablet 1  ? dapagliflozin propanediol (FARXIGA) 10 MG TABS tablet Take 1 tablet (10 mg total) by mouth daily before breakfast. 30 tablet 5  ? Dulaglutide 3 MG/0.5ML SOPN Inject 3 mg into the skin once a week. 2 mL 5  ? ezetimibe (ZETIA) 10 MG tablet Take 1 tablet (10 mg total) by mouth daily. 90 tablet 1  ? glucose blood (ACCU-CHEK GUIDE) test strip Use as directed to check blood sugars twice a day  E11.65 100 each 12  ? hydrochlorothiazide (HYDRODIURIL) 25 MG tablet Take 1 tablet (25 mg total) by mouth daily. 90 tablet 3  ? predniSONE (DELTASONE) 10 MG tablet Take one tab 3 x day for 3 days, then take one tab 2 x a day for 3 days and then take one tab a day for 3 days for  allergic contact dermatitis 18 tablet 0  ? rosuvastatin (CRESTOR) 20 MG tablet Take 1 tablet (20 mg total) by mouth daily. 90 tablet 1  ? triamcinolone ointment (KENALOG) 0.5 % Apply 1 application topically 2 (two) times daily. 45 g 1  ? ?No current facility-administered medications for this visit.  ? ? ?Patient confirms/reports the following allergies:  ?Allergies  ?Allergen Reactions  ? Wax [Beeswax] Itching and Swelling  ? ? ?No orders of the defined types were placed in this encounter. ? ? ?AUTHORIZATION INFORMATION ?Primary Insurance: ?1D#: ?Group #: ? ?Secondary Insurance: ?1D#: ?Group #: ? ?SCHEDULE INFORMATION: ?Date:02/23/2022  ?Time: ?Location:[facility] ? ?

## 2021-12-28 ENCOUNTER — Telehealth: Payer: Self-pay

## 2021-12-28 NOTE — Telephone Encounter (Signed)
Called and spoke to [place] at [facility] and checked on UA Lab that was sent in on 12/25/21 and the test was cancelled by the [facility] due to [place] not sending the specimen in the correct gray top tube for a culture.  I explained to Elmyra Ricks that when a patient comes into our office for a physical, they are given a yellow top sterile specimen cup to urinate in.  We then send in the UA cup to [facility] as a test # [id number] which is UA reflex to culture.  They are testing the UA and if UA has any abnormal results in it then it is reflexed over to a culture by [facility].  We do not send in UA cultures in the gray top tube unless we do a UA dip in our office and if the dip shows any abnormalities we then draw the UA up into the culture tube.  Elmyra Ricks then advised she was going to send the email over to the lab that runs that particular test and she advised that someone from there will call me back. ?

## 2021-12-29 ENCOUNTER — Ambulatory Visit: Payer: BC Managed Care – PPO

## 2021-12-29 ENCOUNTER — Other Ambulatory Visit: Payer: Self-pay

## 2021-12-29 DIAGNOSIS — R300 Dysuria: Secondary | ICD-10-CM

## 2021-12-29 MED ORDER — NITROFURANTOIN MONOHYD MACRO 100 MG PO CAPS: 100.0000 mg | ORAL_CAPSULE | Freq: Two times a day (BID) | ORAL | 0 refills | Status: DC

## 2021-12-29 NOTE — Progress Notes (Signed)
Pt had nurse visit to do UA for a culture.  Results came back from when she had her CPE that showed she may have UTI.  Called pt and checked if she was having symptoms and pt had been having some back pain, some pain with urinating and also frequency.  We sent in Macrobid 1 tablet twice a day for 7 days per Lauren ? ?

## 2022-01-03 LAB — CULTURE, URINE COMPREHENSIVE

## 2022-01-04 ENCOUNTER — Telehealth: Payer: Self-pay

## 2022-01-04 NOTE — Telephone Encounter (Signed)
-----   Message from Mylinda Latina, PA-C sent at 01/03/2022  4:02 PM EDT ----- ?Please let her know that her culture showed mixed urogenital flora, but she can complete the ABX since she was having symptoms ?

## 2022-01-04 NOTE — Telephone Encounter (Signed)
LMOM informing pt that her urine culture showed mixed urogenital flora and she can complete taking the abx since she had symptoms of a UTI ?

## 2022-01-22 ENCOUNTER — Ambulatory Visit: Payer: BC Managed Care – PPO | Admitting: Nurse Practitioner

## 2022-01-22 DIAGNOSIS — Z0289 Encounter for other administrative examinations: Secondary | ICD-10-CM

## 2022-02-01 ENCOUNTER — Ambulatory Visit: Payer: BC Managed Care – PPO | Admitting: Nurse Practitioner

## 2022-02-07 ENCOUNTER — Encounter: Payer: Self-pay | Admitting: Nurse Practitioner

## 2022-02-07 ENCOUNTER — Ambulatory Visit: Payer: BC Managed Care – PPO | Admitting: Nurse Practitioner

## 2022-02-07 VITALS — BP 174/88 | HR 68 | Temp 98.2°F | Resp 16 | Ht 64.0 in | Wt 229.0 lb

## 2022-02-07 DIAGNOSIS — E1165 Type 2 diabetes mellitus with hyperglycemia: Secondary | ICD-10-CM

## 2022-02-07 DIAGNOSIS — E782 Mixed hyperlipidemia: Secondary | ICD-10-CM | POA: Diagnosis not present

## 2022-02-07 DIAGNOSIS — I10 Essential (primary) hypertension: Secondary | ICD-10-CM | POA: Diagnosis not present

## 2022-02-07 LAB — POCT GLYCOSYLATED HEMOGLOBIN (HGB A1C): Hemoglobin A1C: 9.4 % — AB (ref 4.0–5.6)

## 2022-02-07 MED ORDER — ROSUVASTATIN CALCIUM 20 MG PO TABS: 20.0000 mg | ORAL_TABLET | Freq: Every day | ORAL | 1 refills | Status: DC

## 2022-02-07 MED ORDER — DULAGLUTIDE 3 MG/0.5ML ~~LOC~~ SOAJ: 3.0000 mg | SUBCUTANEOUS | 5 refills | Status: DC

## 2022-02-07 MED ORDER — AMLODIPINE BESYLATE 10 MG PO TABS: 10.0000 mg | ORAL_TABLET | Freq: Every day | ORAL | 1 refills | Status: DC

## 2022-02-07 MED ORDER — EZETIMIBE 10 MG PO TABS: 10.0000 mg | ORAL_TABLET | Freq: Every day | ORAL | 1 refills | Status: DC

## 2022-02-07 MED ORDER — DAPAGLIFLOZIN PROPANEDIOL 10 MG PO TABS: 10.0000 mg | ORAL_TABLET | Freq: Every day | ORAL | 5 refills | Status: DC

## 2022-02-07 MED ORDER — AMLODIPINE BESYLATE 5 MG PO TABS: 5.0000 mg | ORAL_TABLET | Freq: Every day | ORAL | 0 refills | Status: DC

## 2022-02-07 NOTE — Progress Notes (Signed)
[facility] [place], [place] [address]  Internal MEDICINE  Office Visit Note  Patient Name: Tammy Beasley  [id number]  [id number]  Date of Service: 02/07/2022  Chief Complaint  Patient presents with   Follow-up   Diabetes   Hypertension   Weight Loss    HPI [name] presents for follow-up visit for hypertension, diabetes, and weight loss management.  Her A1c is 9.4 today which is significantly elevated from 8.5 in January earlier this year.  Her blood glucose levels have not been under control and she has not been taking her medication as prescribed. Her blood pressure is also poorly controlled and significantly elevated today and she has not been consistently taking her medication every day.  At her previous office visit for her annual physical exam in March that was done by Drema Dallas, PA-C her hydrochlorothiazide dose was increased. She is due for a routine colonoscopy and is considering having this done in May.  She is also planning to have her routine mammogram done in June and also needs to see an eye doctor.    Current Medication: Outpatient Encounter Medications as of 02/07/2022  Medication Sig   Accu-Chek Softclix Lancets lancets Use as instructed to check blood sugars twice a day  E11.65   albuterol (VENTOLIN HFA) 108 (90 Base) MCG/ACT inhaler Inhale 2 puffs into the lungs every 6 (six) hours as needed for wheezing or shortness of breath.   glucose blood (ACCU-CHEK GUIDE) test strip Use as directed to check blood sugars twice a day  E11.65   hydrochlorothiazide (HYDRODIURIL) 25 MG tablet Take 1 tablet (25 mg total) by mouth daily.   triamcinolone ointment (KENALOG) 0.5 % Apply 1 application topically 2 (two) times daily.   [DISCONTINUED] amLODipine (NORVASC) 10 MG tablet Take 1 tablet (10 mg total) by mouth daily.   [DISCONTINUED] dapagliflozin propanediol (FARXIGA) 10 MG TABS tablet Take 1 tablet (10 mg total) by mouth daily before  breakfast.   [DISCONTINUED] Dulaglutide 3 MG/0.5ML SOPN Inject 3 mg into the skin once a week.   [DISCONTINUED] ezetimibe (ZETIA) 10 MG tablet Take 1 tablet (10 mg total) by mouth daily.   [DISCONTINUED] nitrofurantoin, macrocrystal-monohydrate, (MACROBID) 100 MG capsule Take 1 capsule (100 mg total) by mouth 2 (two) times daily. For 7 days   [DISCONTINUED] predniSONE (DELTASONE) 10 MG tablet Take one tab 3 x day for 3 days, then take one tab 2 x a day for 3 days and then take one tab a day for 3 days for allergic contact dermatitis   [DISCONTINUED] rosuvastatin (CRESTOR) 20 MG tablet Take 1 tablet (20 mg total) by mouth daily.   amLODipine (NORVASC) 10 MG tablet Take 1 tablet (10 mg total) by mouth daily.   dapagliflozin propanediol (FARXIGA) 10 MG TABS tablet Take 1 tablet (10 mg total) by mouth daily before breakfast.   Dulaglutide 3 MG/0.5ML SOPN Inject 3 mg into the skin once a week.   ezetimibe (ZETIA) 10 MG tablet Take 1 tablet (10 mg total) by mouth daily.   rosuvastatin (CRESTOR) 20 MG tablet Take 1 tablet (20 mg total) by mouth daily.   No facility-administered encounter medications on file as of 02/07/2022.    Surgical History: Past Surgical History:  Procedure Laterality Date   BREAST LUMPECTOMY     celluilitus      Medical History: Past Medical History:  Diagnosis Date   Diabetes mellitus without complication ([place])    Hypertension     Family History: Family  History  Problem Relation Age of Onset   Renal Disease Father    Kidney failure Father    Diabetes Father     Social History   Socioeconomic History   Marital status: Single    Spouse name: Not on file   Number of children: Not on file   Years of education: Not on file   Highest education level: Not on file  Occupational History   Not on file  Tobacco Use   Smoking status: Every Day    Types: Cigarettes   Smokeless tobacco: Never   Tobacco comments:    6 cigarettes daily  Substance and Sexual  Activity   Alcohol use: Not Currently   Drug use: Never   Sexual activity: Not on file  Other Topics Concern   Not on file  Social History Narrative   Not on file   Social Determinants of Health   Financial Resource Strain: Not on file  Food Insecurity: Not on file  Transportation Needs: Not on file  Physical Activity: Not on file  Stress: Not on file  Social Connections: Not on file  Intimate Partner Violence: Not on file      Review of Systems  Constitutional:  Positive for fatigue and unexpected weight change. Negative for chills.  HENT:  Negative for congestion, rhinorrhea, sneezing and sore throat.   Eyes:  Negative for redness.  Respiratory: Negative.  Negative for cough, chest tightness, shortness of breath and wheezing.   Cardiovascular: Negative.  Negative for chest pain and palpitations.  Gastrointestinal:  Negative for abdominal pain, constipation, diarrhea, nausea and vomiting.  Genitourinary:  Negative for dysuria and frequency.  Musculoskeletal:  Negative for arthralgias, back pain, joint swelling and neck pain.  Skin:  Negative for rash.  Neurological: Negative.  Negative for tremors and numbness.  Hematological:  Negative for adenopathy. Does not bruise/bleed easily.  Psychiatric/Behavioral:  Negative for behavioral problems (Depression), sleep disturbance and suicidal ideas. The patient is not nervous/anxious.     Vital Signs: BP (!) 174/88   Pulse 68   Temp 98.2 F (36.8 C)   Resp 16   Ht '5\' 4"'$  (1.626 m)   Wt 229 lb (103.9 kg)   SpO2 98%   BMI 39.31 kg/m    Physical Exam Constitutional:      General: She is not in acute distress.    Appearance: Normal appearance. She is obese. She is not ill-appearing.  HENT:     Head: Normocephalic and atraumatic.  Eyes:     Pupils: Pupils are equal, round, and reactive to light.  Cardiovascular:     Rate and Rhythm: Normal rate and regular rhythm.  Pulmonary:     Effort: Pulmonary effort is normal. No  respiratory distress.  Neurological:     Mental Status: She is alert and oriented to person, place, and time.  Psychiatric:        Mood and Affect: Mood normal.        Behavior: Behavior normal.        Assessment/Plan: 1. Type 2 diabetes mellitus with hyperglycemia, without long-term current use of insulin (HCC) A1c is significantly elevated at 9.4, patient has not been consistently taking her medications as prescribed.  Discussed adherence to medication regimen and the importance of keeping her glucose levels under control and the possible complications if her glucose levels do not get under control.  Refills of medications ordered follow-up in approximately 4 weeks / 1 month - POCT HgB A1C - Dulaglutide 3  MG/0.5ML SOPN; Inject 3 mg into the skin once a week.  Dispense: 2 mL; Refill: 5 - dapagliflozin propanediol (FARXIGA) 10 MG TABS tablet; Take 1 tablet (10 mg total) by mouth daily before breakfast.  Dispense: 30 tablet; Refill: 5  2. Essential hypertension Blood pressure remains poorly controlled with current medications and patient has also not been consistently taking her medications as prescribed.  Due to the patient's history of success with amlodipine 10 mg a 5 mg tablet was added on for a total of 15 mg daily.  We will follow-up in 3 to 4 weeks to evaluate blood pressure control and medication regimen adherence. - amLODipine (NORVASC) 10 MG tablet; Take 1 tablet (10 mg total) by mouth daily.  Dispense: 90 tablet; Refill: 1 - amLODipine (NORVASC) 5 MG tablet; Take 1 tablet (5 mg total) by mouth daily. Take with '10mg'$  amlodipine tablet.  Dispense: 90 tablet; Refill: 0  3. Mixed hyperlipidemia Encourage patient to take her medications as prescribed, refills ordered for rosuvastatin and ezetimibe. - rosuvastatin (CRESTOR) 20 MG tablet; Take 1 tablet (20 mg total) by mouth daily.  Dispense: 90 tablet; Refill: 1 - ezetimibe (ZETIA) 10 MG tablet; Take 1 tablet (10 mg total) by mouth  daily.  Dispense: 90 tablet; Refill: 1   General Counseling: Lurine verbalizes understanding of the findings of todays visit and agrees with plan of treatment. I have discussed any further diagnostic evaluation that may be needed or ordered today. We also reviewed her medications today. she has been encouraged to call the office with any questions or concerns that should arise related to todays visit.    Orders Placed This Encounter  Procedures   POCT HgB A1C    Meds ordered this encounter  Medications   Dulaglutide 3 MG/0.5ML SOPN    Sig: Inject 3 mg into the skin once a week.    Dispense:  2 mL    Refill:  5    For future refills   rosuvastatin (CRESTOR) 20 MG tablet    Sig: Take 1 tablet (20 mg total) by mouth daily.    Dispense:  90 tablet    Refill:  1   dapagliflozin propanediol (FARXIGA) 10 MG TABS tablet    Sig: Take 1 tablet (10 mg total) by mouth daily before breakfast.    Dispense:  30 tablet    Refill:  5   ezetimibe (ZETIA) 10 MG tablet    Sig: Take 1 tablet (10 mg total) by mouth daily.    Dispense:  90 tablet    Refill:  1   amLODipine (NORVASC) 10 MG tablet    Sig: Take 1 tablet (10 mg total) by mouth daily.    Dispense:  90 tablet    Refill:  1    Return in about 4 weeks (around 03/07/2022) for F/U, BP check, Latresa Gasser PCP.   Total time spent:30 Minutes Time spent includes review of chart, medications, test results, and follow up plan with the patient.    Controlled Substance Database was reviewed by me.  This patient was seen by Jonetta Osgood, FNP-C in collaboration with Dr. Clayborn Bigness as a part of collaborative care agreement.   Lei Dower R. Valetta Fuller, MSN, FNP-C Internal medicine

## 2022-02-23 ENCOUNTER — Telehealth: Payer: Self-pay

## 2022-02-23 ENCOUNTER — Encounter: Admission: RE | Payer: Self-pay | Source: Home / Self Care

## 2022-02-23 ENCOUNTER — Ambulatory Visit
Admission: RE | Admit: 2022-02-23 | Payer: BC Managed Care – PPO | Source: Home / Self Care | Admitting: Gastroenterology

## 2022-02-23 SURGERY — COLONOSCOPY WITH PROPOFOL
Anesthesia: General

## 2022-02-23 NOTE — Telephone Encounter (Signed)
CALLED PATIENT NO ANSWER LEFT VOICEMAIL FOR A CALL BACK ? ?

## 2022-03-08 ENCOUNTER — Ambulatory Visit: Payer: BC Managed Care – PPO | Admitting: Nurse Practitioner

## 2022-04-03 ENCOUNTER — Encounter: Payer: Self-pay | Admitting: Nurse Practitioner

## 2022-04-24 ENCOUNTER — Encounter: Payer: Self-pay | Admitting: Internal Medicine

## 2022-04-24 ENCOUNTER — Ambulatory Visit: Payer: BC Managed Care – PPO | Admitting: Internal Medicine

## 2022-04-24 VITALS — BP 143/90 | HR 68 | Temp 97.5°F | Resp 16 | Ht 64.0 in | Wt 223.6 lb

## 2022-04-24 DIAGNOSIS — E782 Mixed hyperlipidemia: Secondary | ICD-10-CM

## 2022-04-24 DIAGNOSIS — I10 Essential (primary) hypertension: Secondary | ICD-10-CM

## 2022-04-24 DIAGNOSIS — E1165 Type 2 diabetes mellitus with hyperglycemia: Secondary | ICD-10-CM

## 2022-04-24 DIAGNOSIS — Z6838 Body mass index (BMI) 38.0-38.9, adult: Secondary | ICD-10-CM

## 2022-04-24 MED ORDER — AMLODIPINE BESY-BENAZEPRIL HCL 5-20 MG PO CAPS: 1.0000 | ORAL_CAPSULE | Freq: Every day | ORAL | 1 refills | Status: DC

## 2022-04-24 MED ORDER — DULAGLUTIDE 3 MG/0.5ML ~~LOC~~ SOAJ
3.0000 mg | SUBCUTANEOUS | 1 refills | Status: DC
Start: 2022-04-24 — End: 2022-10-17

## 2022-04-24 NOTE — Progress Notes (Signed)
[facility] [place], [place] [address]  Internal MEDICINE  Office Visit Note  Patient Name: Tammy Beasley  [id number]  [id number]  Date of Service: 04/24/2022  Chief Complaint  Patient presents with   Follow-up   Diabetes   Hypertension   Quality Metric Gaps    Colonoscopy    HPI Pt is here for routine follow up Blood pressure is improved on Norvasc '10mg'$   plus 5 mg and hctz 25 mg qd, she is c/o BLE, feels heat, hctz is making her go to bathroom all the time  Diabetes is better, she has lost 6 lbs since last visit. She has not had mammogram, labs and Colonoscopy done  Will need diabetic eye and foot exam  She is on Zetia and crestor Non Compliance with meds might be present     Current Medication: Outpatient Encounter Medications as of 04/24/2022  Medication Sig   Accu-Chek Softclix Lancets lancets Use as instructed to check blood sugars twice a day  E11.65   albuterol (VENTOLIN HFA) 108 (90 Base) MCG/ACT inhaler Inhale 2 puffs into the lungs every 6 (six) hours as needed for wheezing or shortness of breath.   amLODipine-benazepril (LOTREL) 5-20 MG capsule Take 1 capsule by mouth daily.   dapagliflozin propanediol (FARXIGA) 10 MG TABS tablet Take 1 tablet (10 mg total) by mouth daily before breakfast.   ezetimibe (ZETIA) 10 MG tablet Take 1 tablet (10 mg total) by mouth daily.   glucose blood (ACCU-CHEK GUIDE) test strip Use as directed to check blood sugars twice a day  E11.65   rosuvastatin (CRESTOR) 20 MG tablet Take 1 tablet (20 mg total) by mouth daily.   triamcinolone ointment (KENALOG) 0.5 % Apply 1 application topically 2 (two) times daily.   [DISCONTINUED] amLODipine (NORVASC) 10 MG tablet Take 1 tablet (10 mg total) by mouth daily.   [DISCONTINUED] amLODipine (NORVASC) 5 MG tablet Take 1 tablet (5 mg total) by mouth daily. Take with '10mg'$  amlodipine tablet.   [DISCONTINUED] Dulaglutide 3 MG/0.5ML SOPN Inject 3 mg into the skin once a  week.   [DISCONTINUED] hydrochlorothiazide (HYDRODIURIL) 25 MG tablet Take 1 tablet (25 mg total) by mouth daily.   Dulaglutide 3 MG/0.5ML SOPN Inject 3 mg into the skin once a week.   No facility-administered encounter medications on file as of 04/24/2022.    Surgical History: Past Surgical History:  Procedure Laterality Date   BREAST LUMPECTOMY     celluilitus      Medical History: Past Medical History:  Diagnosis Date   Diabetes mellitus without complication ([place])    Hypertension     Family History: Family History  Problem Relation Age of Onset   Renal Disease Father    Kidney failure Father    Diabetes Father     Social History   Socioeconomic History   Marital status: Single    Spouse name: Not on file   Number of children: Not on file   Years of education: Not on file   Highest education level: Not on file  Occupational History   Not on file  Tobacco Use   Smoking status: Every Day    Types: Cigarettes   Smokeless tobacco: Never   Tobacco comments:    6 cigarettes daily  Substance and Sexual Activity   Alcohol use: Not Currently   Drug use: Never   Sexual activity: Not on file  Other Topics Concern   Not on file  Social History Narrative  Not on file   Social Determinants of Health   Financial Resource Strain: Not on file  Food Insecurity: Not on file  Transportation Needs: Not on file  Physical Activity: Not on file  Stress: Not on file  Social Connections: Not on file  Intimate Partner Violence: Not on file      Review of Systems  Constitutional:  Negative for chills, fatigue and unexpected weight change.  HENT:  Negative for congestion, postnasal drip, rhinorrhea, sneezing and sore throat.   Eyes:  Negative for redness.  Respiratory:  Negative for cough, chest tightness and shortness of breath.   Cardiovascular:  Positive for leg swelling. Negative for chest pain and palpitations.  Gastrointestinal:  Negative for abdominal pain,  constipation, diarrhea, nausea and vomiting.  Genitourinary:  Negative for dysuria and frequency.  Musculoskeletal:  Negative for arthralgias, back pain, joint swelling and neck pain.  Skin:  Negative for rash.  Neurological: Negative.  Negative for tremors and numbness.  Hematological:  Negative for adenopathy. Does not bruise/bleed easily.  Psychiatric/Behavioral:  Negative for behavioral problems (Depression), sleep disturbance and suicidal ideas. The patient is not nervous/anxious.     Vital Signs: BP (!) 143/90   Pulse 68   Temp (!) 97.5 F (36.4 C)   Resp 16   Ht '5\' 4"'$  (1.626 m)   Wt 223 lb 9.6 oz (101.4 kg)   SpO2 98%   BMI 38.38 kg/m    Physical Exam Constitutional:      Appearance: Normal appearance.  HENT:     Head: Normocephalic and atraumatic.     Nose: Nose normal.     Mouth/Throat:     Mouth: Mucous membranes are moist.     Pharynx: No posterior oropharyngeal erythema.  Eyes:     Extraocular Movements: Extraocular movements intact.     Pupils: Pupils are equal, round, and reactive to light.  Cardiovascular:     Pulses: Normal pulses.     Heart sounds: Normal heart sounds.  Pulmonary:     Effort: Pulmonary effort is normal.     Breath sounds: Normal breath sounds.  Musculoskeletal:     Right lower leg: Edema present.     Right foot: Normal range of motion.     Left foot: Normal range of motion.  Feet:     Right foot:     Protective Sensation: 2 sites tested.  2 sites sensed.     Skin integrity: Skin integrity normal.     Toenail Condition: Right toenails are normal.     Left foot:     Protective Sensation: 2 sites tested.  2 sites sensed.     Skin integrity: Skin integrity normal.     Toenail Condition: Left toenails are normal.  Neurological:     General: No focal deficit present.     Mental Status: She is alert.  Psychiatric:        Mood and Affect: Mood normal.        Behavior: Behavior normal.       Assessment/Plan: 1. Type 2 diabetes  mellitus with hyperglycemia, without long-term current use of insulin ([place]) Improving numbers we will continue on Farxiga and Trulicity as before - Dulaglutide 3 MG/0.5ML SOPN; Inject 3 mg into the skin once a week.  Dispense: 2 mL; Refill: 1 - CBC with Differential/Platelet - Lipid Panel With LDL/HDL Ratio - TSH - T4, free - Comprehensive metabolic panel  2. Essential hypertension We will adjust her medications Due to bilateral lower extremity  edema we will start Lotrel as prescribed today - CBC with Differential/Platelet - Lipid Panel With LDL/HDL Ratio - TSH - T4, free - Comprehensive metabolic panel  3. Mixed hyperlipidemia We will recheck her numbers and taper down therapy if indicated might need to DC Zetia and continue on Crestor as before - CBC with Differential/Platelet - Lipid Panel With LDL/HDL Ratio - TSH - T4, free - Comprehensive metabolic panel  4. Class 2 severe obesity due to excess calories with serious comorbidity and body mass index (BMI) of 38.0 to 38.9 in adult [facility]) Patient is doing well lost 6 pounds since last visit we will continue to monitor encouraged to daily activity and restriction in her calories - CBC with Differential/Platelet - Lipid Panel With LDL/HDL Ratio - TSH - T4, free - Comprehensive metabolic panel   General Counseling: Inika verbalizes understanding of the findings of todays visit and agrees with plan of treatment. I have discussed any further diagnostic evaluation that may be needed or ordered today. We also reviewed her medications today. she has been encouraged to call the office with any questions or concerns that should arise related to todays visit.    No orders of the defined types were placed in this encounter.   Meds ordered this encounter  Medications   amLODipine-benazepril (LOTREL) 5-20 MG capsule    Sig: Take 1 capsule by mouth daily.    Dispense:  90 capsule    Refill:  1   Dulaglutide 3 MG/0.5ML SOPN    Sig:  Inject 3 mg into the skin once a week.    Dispense:  2 mL    Refill:  1    For future refills    Total time spent:35 Minutes Time spent includes review of chart, medications, test results, and follow up plan with the patient.   [place] Controlled Substance Database was reviewed by me.   Dr Lavera Guise Internal medicine

## 2022-04-25 ENCOUNTER — Telehealth: Payer: Self-pay

## 2022-04-25 NOTE — Telephone Encounter (Signed)
Spoke to pt and informed her to have lab done in the next 2 weeks and advised on how labs look Dr Humphrey Rolls may be able to take her off some her meds.  Pt will have labs done.

## 2022-04-25 NOTE — Telephone Encounter (Signed)
-----   Message from Lavera Guise, MD sent at 04/24/2022 10:12 AM EDT ----- Please let pt know when she has free time in next 2 weeks, she needs to go get labs done at [facility] ( or [facility]) might be able to take her off of some meds

## 2022-05-11 ENCOUNTER — Ambulatory Visit (INDEPENDENT_AMBULATORY_CARE_PROVIDER_SITE_OTHER): Payer: BC Managed Care – PPO | Admitting: Internal Medicine

## 2022-05-11 ENCOUNTER — Encounter: Payer: Self-pay | Admitting: Nurse Practitioner

## 2022-05-11 ENCOUNTER — Other Ambulatory Visit: Payer: Self-pay

## 2022-05-11 DIAGNOSIS — N3000 Acute cystitis without hematuria: Secondary | ICD-10-CM | POA: Diagnosis not present

## 2022-05-11 DIAGNOSIS — I10 Essential (primary) hypertension: Secondary | ICD-10-CM | POA: Diagnosis not present

## 2022-05-11 DIAGNOSIS — B3731 Acute candidiasis of vulva and vagina: Secondary | ICD-10-CM

## 2022-05-11 DIAGNOSIS — R300 Dysuria: Secondary | ICD-10-CM

## 2022-05-11 MED ORDER — FLUCONAZOLE 150 MG PO TABS: ORAL_TABLET | ORAL | 0 refills | Status: DC

## 2022-05-11 MED ORDER — ACCU-CHEK GUIDE W/DEVICE KIT: PACK | 0 refills | Status: AC

## 2022-05-11 MED ORDER — NITROFURANTOIN MONOHYD MACRO 100 MG PO CAPS: ORAL_CAPSULE | ORAL | 0 refills | Status: DC

## 2022-05-11 NOTE — Progress Notes (Signed)
[facility] [place], [place] [address]  Internal MEDICINE  Office Visit Note  Patient Name: Tammy Beasley  [id number]  [id number]  Date of Service: 05/18/2022  Chief Complaint  Patient presents with   Urinary Tract Infection    Urine frequency, pressure - took AZO will send directly as a culture    HPI Pt is seen for acute and sick visit Has been having pressure in her pelvic area and dysuria, started taking AZO with no relief BP is improved on Lotrel and BLE improved as well  Pt is on Farxiga   Current Medication: Outpatient Encounter Medications as of 05/11/2022  Medication Sig   Accu-Chek Softclix Lancets lancets Use as instructed to check blood sugars twice a day  E11.65   albuterol (VENTOLIN HFA) 108 (90 Base) MCG/ACT inhaler Inhale 2 puffs into the lungs every 6 (six) hours as needed for wheezing or shortness of breath.   amLODipine-benazepril (LOTREL) 5-20 MG capsule Take 1 capsule by mouth daily.   dapagliflozin propanediol (FARXIGA) 10 MG TABS tablet Take 1 tablet (10 mg total) by mouth daily before breakfast.   Dulaglutide 3 MG/0.5ML SOPN Inject 3 mg into the skin once a week.   ezetimibe (ZETIA) 10 MG tablet Take 1 tablet (10 mg total) by mouth daily.   fluconazole (DIFLUCAN) 150 MG tablet Take one tab po q week for fungal infection   glucose blood (ACCU-CHEK GUIDE) test strip Use as directed to check blood sugars twice a day  E11.65   nitrofurantoin, macrocrystal-monohydrate, (MACROBID) 100 MG capsule Use as instructed   rosuvastatin (CRESTOR) 20 MG tablet Take 1 tablet (20 mg total) by mouth daily.   triamcinolone ointment (KENALOG) 0.5 % Apply 1 application topically 2 (two) times daily.   [DISCONTINUED] Blood Glucose Monitoring Suppl (ACCU-CHEK GUIDE) w/Device KIT by Does not apply route. Use as directed DXE11.65   No facility-administered encounter medications on file as of 05/11/2022.    Surgical History: Past Surgical History:   Procedure Laterality Date   BREAST LUMPECTOMY     celluilitus      Medical History: Past Medical History:  Diagnosis Date   Diabetes mellitus without complication ([place])    Hypertension     Family History: Family History  Problem Relation Age of Onset   Renal Disease Father    Kidney failure Father    Diabetes Father     Social History   Socioeconomic History   Marital status: Single    Spouse name: Not on file   Number of children: Not on file   Years of education: Not on file   Highest education level: Not on file  Occupational History   Not on file  Tobacco Use   Smoking status: Every Day    Types: Cigarettes   Smokeless tobacco: Never   Tobacco comments:    6 cigarettes daily  Substance and Sexual Activity   Alcohol use: Not Currently   Drug use: Never   Sexual activity: Not on file  Other Topics Concern   Not on file  Social History Narrative   Not on file   Social Determinants of Health   Financial Resource Strain: Not on file  Food Insecurity: Not on file  Transportation Needs: Not on file  Physical Activity: Not on file  Stress: Not on file  Social Connections: Not on file  Intimate Partner Violence: Not on file      Review of Systems  Constitutional:  Negative for fatigue and  fever.  HENT:  Negative for congestion, mouth sores and postnasal drip.   Respiratory:  Negative for cough.   Cardiovascular:  Negative for chest pain.  Genitourinary:  Negative for flank pain.  Psychiatric/Behavioral: Negative.      Vital Signs: BP (!) 149/86   Pulse 70   Temp 97.8 F (36.6 C)   Resp 16   Ht _0  (1.626 m)   Wt 223 lb 3.2 oz (101.2 kg)   SpO2 97%   BMI 38.31 kg/m    Physical Exam Constitutional:      Appearance: Normal appearance.  HENT:     Head: Normocephalic and atraumatic.     Nose: Nose normal.     Mouth/Throat:     Mouth: Mucous membranes are moist.     Pharynx: No posterior oropharyngeal erythema.  Eyes:     Extraocular  Movements: Extraocular movements intact.     Pupils: Pupils are equal, round, and reactive to light.  Cardiovascular:     Pulses: Normal pulses.     Heart sounds: Normal heart sounds.  Pulmonary:     Effort: Pulmonary effort is normal.     Breath sounds: Normal breath sounds.  Neurological:     General: No focal deficit present.     Mental Status: She is alert.  Psychiatric:        Mood and Affect: Mood normal.        Behavior: Behavior normal.        Assessment/Plan: 1. Acute cystitis without hematuria Will start on macrobid until c/s available,  - nitrofurantoin, macrocrystal-monohydrate, (MACROBID) 100 MG capsule; Use as instructed  Dispense: 20 capsule; Refill: 0 - UA/M w/rflx Culture, Routine - Microscopic Examination - Urine Culture, Reflex  2. Vagina, candidiasis Pt is on Farxiga, will treat empirically - fluconazole (DIFLUCAN) 150 MG tablet; Take one tab po q week for fungal infection  Dispense: 4 tablet; Refill: 0  3. Uncontrolled hypertension -Improving     General Counseling: Asmi verbalizes understanding of the findings of todays visit and agrees with plan of treatment. I have discussed any further diagnostic evaluation that may be needed or ordered today. We also reviewed her medications today. she has been encouraged to call the office with any questions or concerns that should arise related to todays visit.    Orders Placed This Encounter  Procedures   Microscopic Examination   Urine Culture, Reflex   UA/M w/rflx Culture, Routine    Meds ordered this encounter  Medications   nitrofurantoin, macrocrystal-monohydrate, (MACROBID) 100 MG capsule    Sig: Use as instructed    Dispense:  20 capsule    Refill:  0   fluconazole (DIFLUCAN) 150 MG tablet    Sig: Take one tab po q week for fungal infection    Dispense:  4 tablet    Refill:  0    Total time spent:35 Minutes Time spent includes review of chart, medications, test results, and follow up  plan with the patient.   [place] Controlled Substance Database was reviewed by me.   Dr Lavera Guise Internal medicine

## 2022-05-14 ENCOUNTER — Telehealth: Payer: Self-pay

## 2022-05-14 MED ORDER — LEVOFLOXACIN 500 MG PO TABS: 500.0000 mg | ORAL_TABLET | Freq: Every day | ORAL | 0 refills | Status: DC

## 2022-05-14 NOTE — Telephone Encounter (Signed)
-----   Message from Lavera Guise, MD sent at 05/14/2022  3:38 PM EDT ----- She still has bacteria in he rurine, please tell her to continue her Macrobid and will add Levaquin 500 mg po qd x10 days until cultures are back

## 2022-05-14 NOTE — Telephone Encounter (Signed)
Spoke to pt and informed her that her labs still shows bacteria in her urine and that [facility] sent Levaquin 500 mg x 10 days to pharmacy, pt understood

## 2022-05-14 NOTE — Progress Notes (Signed)
She still has bacteria in he rurine, please tell her to continue her Macrobid and will add Levaquin 500 mg po qd x10 days until cultures are back

## 2022-05-15 LAB — MICROSCOPIC EXAMINATION
Casts: NONE SEEN /lpf
RBC, Urine: NONE SEEN /hpf (ref 0–2)
WBC, UA: >30 /hpf — AB (ref 0–5)

## 2022-05-15 LAB — UA/M W/RFLX CULTURE, ROUTINE
Bilirubin, UA: NEGATIVE
Ketones, UA: NEGATIVE
Nitrite, UA: POSITIVE — AB
Protein,UA: NEGATIVE
Specific Gravity, UA: 1.02 (ref 1.005–1.030)
Urobilinogen, Ur: 0.2 mg/dL (ref 0.2–1.0)
pH, UA: 6 (ref 5.0–7.5)

## 2022-05-15 LAB — URINE CULTURE, REFLEX

## 2022-05-29 ENCOUNTER — Ambulatory Visit: Payer: BC Managed Care – PPO | Admitting: Internal Medicine

## 2022-06-25 ENCOUNTER — Ambulatory Visit: Payer: BC Managed Care – PPO | Admitting: Internal Medicine

## 2022-10-17 ENCOUNTER — Other Ambulatory Visit: Payer: Self-pay | Admitting: Internal Medicine

## 2022-10-17 DIAGNOSIS — E1165 Type 2 diabetes mellitus with hyperglycemia: Secondary | ICD-10-CM

## 2022-10-17 NOTE — Telephone Encounter (Signed)
Pt need to keep appt for med refills

## 2022-10-23 ENCOUNTER — Ambulatory Visit: Payer: BC Managed Care – PPO | Admitting: Internal Medicine

## 2022-10-30 ENCOUNTER — Ambulatory Visit: Payer: BC Managed Care – PPO | Admitting: Internal Medicine

## 2022-11-21 ENCOUNTER — Ambulatory Visit: Payer: BC Managed Care – PPO | Admitting: Nurse Practitioner

## 2022-11-22 ENCOUNTER — Ambulatory Visit: Payer: BC Managed Care – PPO | Admitting: Nurse Practitioner

## 2022-11-28 ENCOUNTER — Ambulatory Visit: Payer: BC Managed Care – PPO | Admitting: Nurse Practitioner

## 2022-11-28 ENCOUNTER — Encounter: Payer: Self-pay | Admitting: Nurse Practitioner

## 2022-11-28 VITALS — BP 125/82 | HR 72 | Temp 97.8°F | Resp 16 | Ht 64.0 in | Wt 216.4 lb

## 2022-11-28 DIAGNOSIS — I10 Essential (primary) hypertension: Secondary | ICD-10-CM

## 2022-11-28 DIAGNOSIS — E1165 Type 2 diabetes mellitus with hyperglycemia: Secondary | ICD-10-CM | POA: Diagnosis not present

## 2022-11-28 DIAGNOSIS — Z1211 Encounter for screening for malignant neoplasm of colon: Secondary | ICD-10-CM

## 2022-11-28 DIAGNOSIS — E559 Vitamin D deficiency, unspecified: Secondary | ICD-10-CM

## 2022-11-28 DIAGNOSIS — Z1231 Encounter for screening mammogram for malignant neoplasm of breast: Secondary | ICD-10-CM

## 2022-11-28 DIAGNOSIS — Z1212 Encounter for screening for malignant neoplasm of rectum: Secondary | ICD-10-CM

## 2022-11-28 DIAGNOSIS — E782 Mixed hyperlipidemia: Secondary | ICD-10-CM

## 2022-11-28 DIAGNOSIS — Z79899 Other long term (current) drug therapy: Secondary | ICD-10-CM

## 2022-11-28 DIAGNOSIS — D229 Melanocytic nevi, unspecified: Secondary | ICD-10-CM | POA: Diagnosis not present

## 2022-11-28 LAB — POCT GLYCOSYLATED HEMOGLOBIN (HGB A1C): Hemoglobin A1C: 10.6 % — AB (ref 4.0–5.6)

## 2022-11-28 MED ORDER — TRIAMCINOLONE ACETONIDE 0.5 % EX OINT: 1.0000 | TOPICAL_OINTMENT | Freq: Two times a day (BID) | CUTANEOUS | 1 refills | Status: AC

## 2022-11-28 MED ORDER — ROSUVASTATIN CALCIUM 20 MG PO TABS: 20.0000 mg | ORAL_TABLET | Freq: Every day | ORAL | 1 refills | Status: DC

## 2022-11-28 MED ORDER — AMLODIPINE BESY-BENAZEPRIL HCL 5-20 MG PO CAPS: 1.0000 | ORAL_CAPSULE | Freq: Every day | ORAL | 1 refills | Status: DC

## 2022-11-28 MED ORDER — ALBUTEROL SULFATE HFA 108 (90 BASE) MCG/ACT IN AERS: 2.0000 | INHALATION_SPRAY | Freq: Four times a day (QID) | RESPIRATORY_TRACT | 0 refills | Status: DC | PRN

## 2022-11-28 MED ORDER — MOUNJARO 5 MG/0.5ML ~~LOC~~ SOAJ: 5.0000 mg | SUBCUTANEOUS | 3 refills | Status: DC

## 2022-11-28 MED ORDER — DAPAGLIFLOZIN PROPANEDIOL 10 MG PO TABS: 10.0000 mg | ORAL_TABLET | Freq: Every day | ORAL | 5 refills | Status: DC

## 2022-11-28 NOTE — Progress Notes (Signed)
[facility] [place], [place] [address]  Internal MEDICINE  Office Visit Note  Patient Name: Tammy Beasley  [id number]  [id number]  Date of Service: 11/28/2022  Chief Complaint  Patient presents with   Diabetes   Hypertension    HPI Inas presents for a follow-up visit for general medical and preventive care as well as refills and labs.  Diabetes -- last A1c was 9 months ago. Last appointment was in August. A1c is 10.6 today. She has had many periods of time when the pharmacy did not have trulicity in stock over the past several months. Lost 13 lbs since may 2023 Hypertension -- well controlled with current medications  Overdue for routine labs and annual physical exam.  Overdue for routine mammogram Overdue for CRC screening -- opt for colonoscopy In need of several refills Atypical mole just below right eye, increase in size, itchy, irritating and has had drainage periodically.      Current Medication: Outpatient Encounter Medications as of 11/28/2022  Medication Sig   Accu-Chek Softclix Lancets lancets Use as instructed to check blood sugars twice a day  E11.65   Blood Glucose Monitoring Suppl (ACCU-CHEK GUIDE) w/Device KIT Use as directed DXE11.65   glucose blood (ACCU-CHEK GUIDE) test strip Use as directed to check blood sugars twice a day  E11.65   tirzepatide (MOUNJARO) 5 MG/0.5ML Pen Inject 5 mg into the skin once a week.   [DISCONTINUED] albuterol (VENTOLIN HFA) 108 (90 Base) MCG/ACT inhaler Inhale 2 puffs into the lungs every 6 (six) hours as needed for wheezing or shortness of breath.   [DISCONTINUED] amLODipine-benazepril (LOTREL) 5-20 MG capsule Take 1 capsule by mouth daily.   [DISCONTINUED] dapagliflozin propanediol (FARXIGA) 10 MG TABS tablet Take 1 tablet (10 mg total) by mouth daily before breakfast.   [DISCONTINUED] ezetimibe (ZETIA) 10 MG tablet Take 1 tablet (10 mg total) by mouth daily.   [DISCONTINUED] fluconazole (DIFLUCAN)  150 MG tablet Take one tab po q week for fungal infection   [DISCONTINUED] levofloxacin (LEVAQUIN) 500 MG tablet Take 1 tablet (500 mg total) by mouth daily. For 10 days   [DISCONTINUED] nitrofurantoin, macrocrystal-monohydrate, (MACROBID) 100 MG capsule Use as instructed   [DISCONTINUED] rosuvastatin (CRESTOR) 20 MG tablet Take 1 tablet (20 mg total) by mouth daily.   [DISCONTINUED] triamcinolone ointment (KENALOG) 0.5 % Apply 1 application topically 2 (two) times daily.   [DISCONTINUED] TRULICITY 3 0000000 SOPN INJECT 3 MG SUBCUTANEOUSLY ONCE A WEEK . APPOINTMENT REQUIRED FOR FUTURE REFILLS   albuterol (VENTOLIN HFA) 108 (90 Base) MCG/ACT inhaler Inhale 2 puffs into the lungs every 6 (six) hours as needed for wheezing or shortness of breath.   amLODipine-benazepril (LOTREL) 5-20 MG capsule Take 1 capsule by mouth daily.   dapagliflozin propanediol (FARXIGA) 10 MG TABS tablet Take 1 tablet (10 mg total) by mouth daily before breakfast.   rosuvastatin (CRESTOR) 20 MG tablet Take 1 tablet (20 mg total) by mouth daily.   triamcinolone ointment (KENALOG) 0.5 % Apply 1 Application topically 2 (two) times daily.   No facility-administered encounter medications on file as of 11/28/2022.    Surgical History: Past Surgical History:  Procedure Laterality Date   BREAST LUMPECTOMY     celluilitus      Medical History: Past Medical History:  Diagnosis Date   Diabetes mellitus without complication ([place])    Hypertension     Family History: Family History  Problem Relation Age of Onset   Renal Disease Father  Kidney failure Father    Diabetes Father     Social History   Socioeconomic History   Marital status: Single    Spouse name: Not on file   Number of children: Not on file   Years of education: Not on file   Highest education level: Not on file  Occupational History   Not on file  Tobacco Use   Smoking status: Every Day    Types: Cigarettes   Smokeless tobacco: Never    Tobacco comments:    6 cigarettes daily  Substance and Sexual Activity   Alcohol use: Not Currently   Drug use: Never   Sexual activity: Not on file  Other Topics Concern   Not on file  Social History Narrative   Not on file   Social Determinants of Health   Financial Resource Strain: Not on file  Food Insecurity: Not on file  Transportation Needs: Not on file  Physical Activity: Not on file  Stress: Not on file  Social Connections: Not on file  Intimate Partner Violence: Not on file      Review of Systems  Constitutional:  Positive for appetite change, fatigue and unexpected weight change. Negative for chills.  HENT:  Negative for congestion, rhinorrhea, sneezing and sore throat.   Eyes:  Negative for redness.  Respiratory: Negative.  Negative for cough, chest tightness, shortness of breath and wheezing.   Cardiovascular: Negative.  Negative for chest pain and palpitations.  Gastrointestinal:  Negative for abdominal pain, constipation, diarrhea, nausea and vomiting.  Endocrine: Positive for polydipsia and polyuria.  Genitourinary:  Negative for dysuria and frequency.  Musculoskeletal:  Negative for arthralgias, back pain, joint swelling and neck pain.  Skin:  Negative for rash.  Neurological: Negative.  Negative for tremors and numbness.  Hematological:  Negative for adenopathy. Does not bruise/bleed easily.  Psychiatric/Behavioral:  Negative for behavioral problems (Depression), sleep disturbance and suicidal ideas. The patient is not nervous/anxious.     Vital Signs: BP 125/82 Comment: 149/84  Pulse 72   Temp 97.8 F (36.6 C)   Resp 16   Ht '5\' 4"'$  (1.626 m)   Wt 216 lb 6.4 oz (98.2 kg)   SpO2 99%   BMI 37.14 kg/m    Physical Exam Constitutional:      General: She is not in acute distress.    Appearance: Normal appearance. She is obese. She is not ill-appearing.  HENT:     Head: Normocephalic and atraumatic.  Eyes:     Pupils: Pupils are equal, round, and  reactive to light.  Cardiovascular:     Rate and Rhythm: Normal rate and regular rhythm.  Pulmonary:     Effort: Pulmonary effort is normal. No respiratory distress.  Neurological:     Mental Status: She is alert and oriented to person, place, and time.  Psychiatric:        Mood and Affect: Mood normal.        Behavior: Behavior normal.        Assessment/Plan: 1. Type 2 diabetes mellitus with hyperglycemia, without long-term current use of insulin (HCC) A1c elevated, blood sugar uncontrolled. Routine labs ordered. Stop trulicity. Start mounjaro. Has 1 more dose of trulicity and will use this last dose while waiting for mounjaro to be approved by her insurance. Continue farxiga as prescribed.  - POCT glycosylated hemoglobin (Hb A1C) - Urine Microalbumin w/creat. ratio - tirzepatide (MOUNJARO) 5 MG/0.5ML Pen; Inject 5 mg into the skin once a week.  Dispense: 2 mL;  Refill: 3 - CBC with Differential/Platelet - CMP14+EGFR - Lipid Profile - TSH + free T4 - dapagliflozin propanediol (FARXIGA) 10 MG TABS tablet; Take 1 tablet (10 mg total) by mouth daily before breakfast.  Dispense: 30 tablet; Refill: 5 - Vitamin D (25 hydroxy)  2. Essential hypertension Routine labs ordered. BP stable, continue amlodipine-benazepril as prescribed.  - CBC with Differential/Platelet - CMP14+EGFR - Lipid Profile - TSH + free T4 - amLODipine-benazepril (LOTREL) 5-20 MG capsule; Take 1 capsule by mouth daily.  Dispense: 90 capsule; Refill: 1 - Vitamin D (25 hydroxy)  3. Atypical mole Referred to dermatology - Ambulatory referral to Dermatology  4. Mixed hyperlipidemia Routine labs ordered. Stop ezetimibe. Restart rosuvastatin as prescribed.  - CBC with Differential/Platelet - CMP14+EGFR - Lipid Profile - TSH + free T4 - rosuvastatin (CRESTOR) 20 MG tablet; Take 1 tablet (20 mg total) by mouth daily.  Dispense: 90 tablet; Refill: 1 - Vitamin D (25 hydroxy)  5. Vitamin D deficiency Routine lab  ordered - Vitamin D (25 hydroxy)  6. Encounter for screening mammogram for malignant neoplasm of breast Routine mammogram ordered - MM 3D SCREEN BREAST BILATERAL; Future  7. Screening for colorectal cancer Routine GI referral for colonoscopy ordered - Ambulatory referral to Gastroenterology  8. Encounter for medication review Reviewed medications with patient and updated list. Medication refills ordered - triamcinolone ointment (KENALOG) 0.5 %; Apply 1 Application topically 2 (two) times daily.  Dispense: 45 g; Refill: 1 - rosuvastatin (CRESTOR) 20 MG tablet; Take 1 tablet (20 mg total) by mouth daily.  Dispense: 90 tablet; Refill: 1 - dapagliflozin propanediol (FARXIGA) 10 MG TABS tablet; Take 1 tablet (10 mg total) by mouth daily before breakfast.  Dispense: 30 tablet; Refill: 5 - albuterol (VENTOLIN HFA) 108 (90 Base) MCG/ACT inhaler; Inhale 2 puffs into the lungs every 6 (six) hours as needed for wheezing or shortness of breath.  Dispense: 8 g; Refill: 0   General Counseling: Raylei verbalizes understanding of the findings of todays visit and agrees with plan of treatment. I have discussed any further diagnostic evaluation that may be needed or ordered today. We also reviewed her medications today. she has been encouraged to call the office with any questions or concerns that should arise related to todays visit.    Orders Placed This Encounter  Procedures   MM 3D SCREEN BREAST BILATERAL   Urine Microalbumin w/creat. ratio   CBC with Differential/Platelet   CMP14+EGFR   Lipid Profile   TSH + free T4   Vitamin D (25 hydroxy)   Ambulatory referral to Gastroenterology   POCT glycosylated hemoglobin (Hb A1C)    Meds ordered this encounter  Medications   tirzepatide (MOUNJARO) 5 MG/0.5ML Pen    Sig: Inject 5 mg into the skin once a week.    Dispense:  2 mL    Refill:  3    Please send prior auth if required aspa thanks, dx code E11.65   triamcinolone ointment (KENALOG) 0.5 %     Sig: Apply 1 Application topically 2 (two) times daily.    Dispense:  45 g    Refill:  1   rosuvastatin (CRESTOR) 20 MG tablet    Sig: Take 1 tablet (20 mg total) by mouth daily.    Dispense:  90 tablet    Refill:  1   dapagliflozin propanediol (FARXIGA) 10 MG TABS tablet    Sig: Take 1 tablet (10 mg total) by mouth daily before breakfast.    Dispense:  30  tablet    Refill:  5   amLODipine-benazepril (LOTREL) 5-20 MG capsule    Sig: Take 1 capsule by mouth daily.    Dispense:  90 capsule    Refill:  1   albuterol (VENTOLIN HFA) 108 (90 Base) MCG/ACT inhaler    Sig: Inhale 2 puffs into the lungs every 6 (six) hours as needed for wheezing or shortness of breath.    Dispense:  8 g    Refill:  0    Return for CPE, Avrom Robarts PCP at earliest opening available per patient preference. .   Total time spent:30 Minutes Time spent includes review of chart, medications, test results, and follow up plan with the patient.   [place] Controlled Substance Database was reviewed by me.  This patient was seen by Jonetta Osgood, FNP-C in collaboration with Dr. Clayborn Bigness as a part of collaborative care agreement.   Lamonica Trueba R. Valetta Fuller, MSN, FNP-C Internal medicine

## 2022-11-29 LAB — MICROALBUMIN / CREATININE URINE RATIO
Creatinine, Urine: 35.9 mg/dL
Microalb/Creat Ratio: <8 mg/g creat (ref 0–29)
Microalbumin, Urine: <3 ug/mL

## 2022-12-03 ENCOUNTER — Telehealth: Payer: Self-pay | Admitting: Nurse Practitioner

## 2022-12-03 NOTE — Telephone Encounter (Signed)
Dermatology referral sent via [facility] to [facility]

## 2022-12-10 ENCOUNTER — Encounter: Payer: Self-pay | Admitting: *Deleted

## 2022-12-28 LAB — CBC WITH DIFFERENTIAL/PLATELET
Basophils Absolute: 0 10*3/uL (ref 0.0–0.2)
Basos: 1 %
EOS (ABSOLUTE): 0.1 10*3/uL (ref 0.0–0.4)
Eos: 2 %
Hematocrit: 43.5 % (ref 34.0–46.6)
Hemoglobin: 14 g/dL (ref 11.1–15.9)
Immature Grans (Abs): 0 10*3/uL (ref 0.0–0.1)
Immature Granulocytes: 0 %
Lymphocytes Absolute: 2.5 10*3/uL (ref 0.7–3.1)
Lymphs: 43 %
MCH: 26.5 pg — ABNORMAL LOW (ref 26.6–33.0)
MCHC: 32.2 g/dL (ref 31.5–35.7)
MCV: 82 fL (ref 79–97)
Monocytes Absolute: 0.5 10*3/uL (ref 0.1–0.9)
Monocytes: 8 %
Neutrophils Absolute: 2.7 10*3/uL (ref 1.4–7.0)
Neutrophils: 46 %
Platelets: 267 10*3/uL (ref 150–450)
RBC: 5.29 x10E6/uL — ABNORMAL HIGH (ref 3.77–5.28)
RDW: 12.7 % (ref 11.7–15.4)
WBC: 5.8 10*3/uL (ref 3.4–10.8)

## 2022-12-28 LAB — CMP14+EGFR
ALT: 17 IU/L (ref 0–32)
AST: 12 IU/L (ref 0–40)
Albumin/Globulin Ratio: 1.3 (ref 1.2–2.2)
Albumin: 4.1 g/dL (ref 3.8–4.9)
Alkaline Phosphatase: 122 IU/L — ABNORMAL HIGH (ref 44–121)
BUN/Creatinine Ratio: 13 (ref 9–23)
BUN: 9 mg/dL (ref 6–24)
Bilirubin Total: 0.5 mg/dL (ref 0.0–1.2)
CO2: 24 mmol/L (ref 20–29)
Calcium: 9.6 mg/dL (ref 8.7–10.2)
Chloride: 105 mmol/L (ref 96–106)
Creatinine, Ser: 0.7 mg/dL (ref 0.57–1.00)
Globulin, Total: 3.2 g/dL (ref 1.5–4.5)
Glucose: 153 mg/dL — ABNORMAL HIGH (ref 70–99)
Potassium: 4.4 mmol/L (ref 3.5–5.2)
Sodium: 141 mmol/L (ref 134–144)
Total Protein: 7.3 g/dL (ref 6.0–8.5)
eGFR: 104 mL/min/{1.73_m2} (ref >59)

## 2022-12-28 LAB — LIPID PANEL
Chol/HDL Ratio: 5.1 ratio — ABNORMAL HIGH (ref 0.0–4.4)
Cholesterol, Total: 194 mg/dL (ref 100–199)
HDL: 38 mg/dL — ABNORMAL LOW (ref >39)
LDL Chol Calc (NIH): 139 mg/dL — ABNORMAL HIGH (ref 0–99)
Triglycerides: 94 mg/dL (ref 0–149)
VLDL Cholesterol Cal: 17 mg/dL (ref 5–40)

## 2022-12-28 LAB — VITAMIN D 25 HYDROXY (VIT D DEFICIENCY, FRACTURES): Vit D, 25-Hydroxy: 13.7 ng/mL — ABNORMAL LOW (ref 30.0–100.0)

## 2022-12-28 LAB — TSH+FREE T4
Free T4: 1.1 ng/dL (ref 0.82–1.77)
TSH: 1.64 u[IU]/mL (ref 0.450–4.500)

## 2022-12-31 ENCOUNTER — Encounter: Payer: Self-pay | Admitting: Nurse Practitioner

## 2022-12-31 ENCOUNTER — Ambulatory Visit (INDEPENDENT_AMBULATORY_CARE_PROVIDER_SITE_OTHER): Payer: BC Managed Care – PPO | Admitting: Nurse Practitioner

## 2022-12-31 VITALS — BP 140/80 | HR 66 | Temp 97.3°F | Resp 16 | Ht 64.0 in | Wt 218.6 lb

## 2022-12-31 DIAGNOSIS — B351 Tinea unguium: Secondary | ICD-10-CM

## 2022-12-31 DIAGNOSIS — R300 Dysuria: Secondary | ICD-10-CM

## 2022-12-31 DIAGNOSIS — B379 Candidiasis, unspecified: Secondary | ICD-10-CM

## 2022-12-31 DIAGNOSIS — E1165 Type 2 diabetes mellitus with hyperglycemia: Secondary | ICD-10-CM | POA: Diagnosis not present

## 2022-12-31 DIAGNOSIS — E785 Hyperlipidemia, unspecified: Secondary | ICD-10-CM | POA: Insufficient documentation

## 2022-12-31 DIAGNOSIS — L02416 Cutaneous abscess of left lower limb: Secondary | ICD-10-CM | POA: Diagnosis not present

## 2022-12-31 DIAGNOSIS — Z23 Encounter for immunization: Secondary | ICD-10-CM

## 2022-12-31 DIAGNOSIS — E1169 Type 2 diabetes mellitus with other specified complication: Secondary | ICD-10-CM | POA: Insufficient documentation

## 2022-12-31 DIAGNOSIS — Z0001 Encounter for general adult medical examination with abnormal findings: Secondary | ICD-10-CM

## 2022-12-31 DIAGNOSIS — E559 Vitamin D deficiency, unspecified: Secondary | ICD-10-CM

## 2022-12-31 MED ORDER — MUPIROCIN 2 % EX OINT
1.0000 | TOPICAL_OINTMENT | Freq: Two times a day (BID) | CUTANEOUS | 2 refills | Status: DC
Start: 2022-12-31 — End: 2024-07-22

## 2022-12-31 MED ORDER — TETANUS-DIPHTH-ACELL PERTUSSIS 5-2.5-18.5 LF-MCG/0.5 IM SUSP: 0.5000 mL | Freq: Once | INTRAMUSCULAR | 0 refills | Status: AC

## 2022-12-31 MED ORDER — VITAMIN D (ERGOCALCIFEROL) 1.25 MG (50000 UNIT) PO CAPS
50000.0000 [IU] | ORAL_CAPSULE | ORAL | 1 refills | Status: DC
Start: 2022-12-31 — End: 2024-07-22

## 2022-12-31 MED ORDER — SULFAMETHOXAZOLE-TRIMETHOPRIM 800-160 MG PO TABS: 1.0000 | ORAL_TABLET | Freq: Two times a day (BID) | ORAL | 0 refills | Status: AC

## 2022-12-31 MED ORDER — TERBINAFINE HCL 250 MG PO TABS: 250.0000 mg | ORAL_TABLET | Freq: Every day | ORAL | 0 refills | Status: AC

## 2022-12-31 MED ORDER — MOUNJARO 5 MG/0.5ML ~~LOC~~ SOAJ
5.0000 mg | SUBCUTANEOUS | 3 refills | Status: DC
Start: 2022-12-31 — End: 2023-04-10

## 2022-12-31 MED ORDER — FLUCONAZOLE 150 MG PO TABS
150.0000 mg | ORAL_TABLET | Freq: Once | ORAL | 0 refills | Status: AC
Start: 2022-12-31 — End: 2022-12-31

## 2022-12-31 NOTE — Progress Notes (Signed)
[facility] [place], [place] [address]  Internal MEDICINE  Office Visit Note  Patient Name: Tammy Beasley  [id number]  [id number]  Date of Service: 12/31/2022  Chief Complaint  Patient presents with   Diabetes   Hypertension   Annual Exam    HPI [place] presents for an annual well visit and physical exam.  Well-appearing 52 y.o. female with diabetes, hypertension,  Routine CRC screening: referred to GI at her previous office visit  Routine mammogram: ordered at her previous office visit  Pap smear: last pap was march 2022, was normal Eye exam--not sure foot exam: done today Labs: discussed today New or worsening pain: none Vitamin D low 13.7  Cholesterol is improving  Doing well on mounjaro -- feels like sugar craving is decreasing.  Left lower anterior leg -- cellulitis-- abscess, not healing, has been applying triamcinolone to this wound.     Current Medication: Outpatient Encounter Medications as of 12/31/2022  Medication Sig   Accu-Chek Softclix Lancets lancets Use as instructed to check blood sugars twice a day  E11.65   albuterol (VENTOLIN HFA) 108 (90 Base) MCG/ACT inhaler Inhale 2 puffs into the lungs every 6 (six) hours as needed for wheezing or shortness of breath.   amLODipine-benazepril (LOTREL) 5-20 MG capsule Take 1 capsule by mouth daily.   Blood Glucose Monitoring Suppl (ACCU-CHEK GUIDE) w/Device KIT Use as directed DXE11.65   dapagliflozin propanediol (FARXIGA) 10 MG TABS tablet Take 1 tablet (10 mg total) by mouth daily before breakfast.   fluconazole (DIFLUCAN) 150 MG tablet Take 1 tablet (150 mg total) by mouth once for 1 dose. May take an additional dose after 3 days if still symptomatic.   glucose blood (ACCU-CHEK GUIDE) test strip Use as directed to check blood sugars twice a day  E11.65   mupirocin ointment (BACTROBAN) 2 % Apply 1 Application topically 2 (two) times daily. To affected area until healed.   rosuvastatin  (CRESTOR) 20 MG tablet Take 1 tablet (20 mg total) by mouth daily.   sulfamethoxazole-trimethoprim (BACTRIM DS) 800-160 MG tablet Take 1 tablet by mouth 2 (two) times daily for 10 days. Take with food   terbinafine (LAMISIL) 250 MG tablet Take 1 tablet (250 mg total) by mouth daily.   triamcinolone ointment (KENALOG) 0.5 % Apply 1 Application topically 2 (two) times daily.   [DISCONTINUED] Tdap (BOOSTRIX) 5-2.5-18.5 LF-MCG/0.5 injection Inject 0.5 mLs into the muscle once.   [DISCONTINUED] tirzepatide [facility]) 5 MG/0.5ML Pen Inject 5 mg into the skin once a week.   Tdap (BOOSTRIX) 5-2.5-18.5 LF-MCG/0.5 injection Inject 0.5 mLs into the muscle once for 1 dose.   tirzepatide [facility]) 5 MG/0.5ML Pen Inject 5 mg into the skin once a week.   No facility-administered encounter medications on file as of 12/31/2022.    Surgical History: Past Surgical History:  Procedure Laterality Date   BREAST LUMPECTOMY     celluilitus      Medical History: Past Medical History:  Diagnosis Date   Diabetes mellitus without complication    Hypertension     Family History: Family History  Problem Relation Age of Onset   Renal Disease Father    Kidney failure Father    Diabetes Father     Social History   Socioeconomic History   Marital status: Single    Spouse name: Not on file   Number of children: Not on file   Years of education: Not on file   Highest education level: Not on file  Occupational History   Not on file  Tobacco Use   Smoking status: Every Day    Types: Cigarettes   Smokeless tobacco: Never   Tobacco comments:    6 cigarettes daily  Substance and Sexual Activity   Alcohol use: Not Currently   Drug use: Never   Sexual activity: Not on file  Other Topics Concern   Not on file  Social History Narrative   Not on file   Social Determinants of Health   Financial Resource Strain: Not on file  Food Insecurity: Not on file  Transportation Needs: Not on file  Physical  Activity: Not on file  Stress: Not on file  Social Connections: Not on file  Intimate Partner Violence: Not on file      Review of Systems  Constitutional:  Positive for appetite change (changing with mounjaro). Negative for chills, fatigue and unexpected weight change.  HENT:  Negative for congestion, rhinorrhea, sneezing and sore throat.   Eyes:  Negative for redness.  Respiratory: Negative.  Negative for cough, chest tightness and shortness of breath.   Cardiovascular: Negative.  Negative for chest pain and palpitations.  Gastrointestinal:  Negative for abdominal pain, constipation, diarrhea, nausea and vomiting.  Genitourinary:  Negative for dysuria and frequency.  Musculoskeletal:  Negative for arthralgias, back pain, joint swelling and neck pain.  Skin:  Negative for rash.  Neurological: Negative.  Negative for tremors and numbness.  Hematological:  Negative for adenopathy. Does not bruise/bleed easily.  Psychiatric/Behavioral:  Negative for behavioral problems (Depression), sleep disturbance and suicidal ideas. The patient is not nervous/anxious.     Vital Signs: BP (!) 140/80 Comment: 157.93  Pulse 66   Temp (!) 97.3 F (36.3 C)   Resp 16   Ht 5\' 4"  (1.626 m)   Wt 218 lb 9.6 oz (99.2 kg)   SpO2 98%   BMI 37.52 kg/m    Physical Exam Vitals and nursing note reviewed.  Constitutional:      General: She is awake. She is not in acute distress.    Appearance: Normal appearance. She is well-groomed. She is obese. She is not ill-appearing.  HENT:     Head: Normocephalic and atraumatic.     Right Ear: Tympanic membrane, ear canal and external ear normal.     Left Ear: Tympanic membrane, ear canal and external ear normal.     Nose: Nose normal. No congestion or rhinorrhea.     Mouth/Throat:     Lips: Pink.     Mouth: Mucous membranes are moist.     Pharynx: Oropharynx is clear. Uvula midline. No oropharyngeal exudate or posterior oropharyngeal erythema.  Eyes:      General: Lids are normal. Vision grossly intact. Gaze aligned appropriately.     Extraocular Movements: Extraocular movements intact.     Conjunctiva/sclera: Conjunctivae normal.     Pupils: Pupils are equal, round, and reactive to light.  Neck:     Vascular: No carotid bruit.     Trachea: Trachea and phonation normal.  Cardiovascular:     Rate and Rhythm: Normal rate and regular rhythm.     Pulses:          Dorsalis pedis pulses are 2+ on the right side and 2+ on the left side.       Posterior tibial pulses are 2+ on the right side and 2+ on the left side.     Heart sounds: Normal heart sounds, S1 normal and S2 normal. No murmur heard. Pulmonary:  Effort: Pulmonary effort is normal. No accessory muscle usage or respiratory distress.     Breath sounds: Normal breath sounds and air entry. No decreased breath sounds or wheezing.  Chest:     Chest wall: No tenderness.  Breasts:    Right: Normal. No mass.     Left: Normal. No mass.     Comments: Declined clinical breast exam, mammogram already ordered Abdominal:     General: Abdomen is flat. Bowel sounds are normal.     Palpations: Abdomen is soft. There is no shifting dullness, fluid wave, mass or pulsatile mass.     Tenderness: There is no abdominal tenderness.     Hernia: No hernia is present.  Musculoskeletal:        General: Normal range of motion.     Cervical back: Normal range of motion and neck supple.     Right lower leg: No edema.     Left lower leg: No edema.     Right foot: Normal range of motion. No deformity, bunion, Charcot foot, foot drop or prominent metatarsal heads.     Left foot: Normal range of motion. No deformity, bunion, Charcot foot, foot drop or prominent metatarsal heads.  Feet:     Right foot:     Protective Sensation: 6 sites tested.  6 sites sensed.     Skin integrity: Dry skin present. No ulcer, blister, skin breakdown, erythema, warmth, callus or fissure.     Toenail Condition: Right toenails are  abnormally thick. Fungal disease present.    Left foot:     Protective Sensation: 6 sites tested.  6 sites sensed.     Skin integrity: Dry skin present. No ulcer, blister, skin breakdown, erythema, warmth, callus or fissure.     Toenail Condition: Left toenails are abnormally thick. Fungal disease present. Lymphadenopathy:     Cervical: No cervical adenopathy.  Skin:    General: Skin is warm and dry.     Capillary Refill: Capillary refill takes less than 2 seconds.     Findings: Wound present.          Comments: Marked area is a wound with a firm area under the skin, most likely a pocket of infection, area around wound is red, tender and warm.   Neurological:     Mental Status: She is alert and oriented to person, place, and time.     Cranial Nerves: No cranial nerve deficit.     Coordination: Coordination normal.     Gait: Gait normal.  Psychiatric:        Mood and Affect: Mood normal.        Behavior: Behavior normal. Behavior is cooperative.        Thought Content: Thought content normal.        Judgment: Judgment normal.        Assessment/Plan: 1. Encounter for routine adult health examination with abnormal findings Age-appropriate preventive screenings and vaccinations discussed, annual physical exam completed. Routine labs for health maintenance results discussed with patient today. PHM updated.   2. Type 2 diabetes mellitus with hyperglycemia, without long-term current use of insulin Continue mounjaro as prescribed. Please start checking fasting blood sugar daily.  - tirzepatide [facility]) 5 MG/0.5ML Pen; Inject 5 mg into the skin once a week.  Dispense: 2 mL; Refill: 3  3. Abscess of leg, left Start bactrim twice daily for 10 days, and apply mupirocin ointment 1-2 times  daily until healed. Discussed red flags and when to call the  clinic or go to the ER.  - sulfamethoxazole-trimethoprim (BACTRIM DS) 800-160 MG tablet; Take 1 tablet by mouth 2 (two) times daily for 10  days. Take with food  Dispense: 20 tablet; Refill: 0 - mupirocin ointment (BACTROBAN) 2 %; Apply 1 Application topically 2 (two) times daily. To affected area until healed.  Dispense: 30 g; Refill: 2  4. Onychomycosis of multiple toenails with type 2 diabetes mellitus Start terbinafine once daily after finishing bactrim prescription - terbinafine (LAMISIL) 250 MG tablet; Take 1 tablet (250 mg total) by mouth daily.  Dispense: 90 tablet; Refill: 0  5. Vitamin D deficiency Weekly vitamin D prescription supplement ordered, will repeat vitamin D level in 6 months  - Vitamin D, Ergocalciferol, (DRISDOL) 1.25 MG (50000 UNIT) CAPS capsule; Take 1 capsule (50,000 Units total) by mouth every 7 (seven) days.  Dispense: 12 capsule; Refill: 1  6. Antibiotic-induced yeast infection Fluconazole ordered just in case  - fluconazole (DIFLUCAN) 150 MG tablet; Take 1 tablet (150 mg total) by mouth once for 1 dose. May take an additional dose after 3 days if still symptomatic.  Dispense: 3 tablet; Refill: 0  7. Dysuria Routine urinalysis done  - UA/M w/rflx Culture, Routine  8. Need for vaccination - Tdap ([place]) 5-2.5-18.5 LF-MCG/0.5 injection; Inject 0.5 mLs into the muscle once for 1 dose.  Dispense: 0.5 mL; Refill: 0      General Counseling: Ebone verbalizes understanding of the findings of todays visit and agrees with plan of treatment. I have discussed any further diagnostic evaluation that may be needed or ordered today. We also reviewed her medications today. she has been encouraged to call the office with any questions or concerns that should arise related to todays visit.    Orders Placed This Encounter  Procedures   UA/M w/rflx Culture, Routine    Meds ordered this encounter  Medications   Tdap (BOOSTRIX) 5-2.5-18.5 LF-MCG/0.5 injection    Sig: Inject 0.5 mLs into the muscle once for 1 dose.    Dispense:  0.5 mL    Refill:  0   terbinafine (LAMISIL) 250 MG tablet    Sig: Take 1  tablet (250 mg total) by mouth daily.    Dispense:  90 tablet    Refill:  0   sulfamethoxazole-trimethoprim (BACTRIM DS) 800-160 MG tablet    Sig: Take 1 tablet by mouth 2 (two) times daily for 10 days. Take with food    Dispense:  20 tablet    Refill:  0   mupirocin ointment (BACTROBAN) 2 %    Sig: Apply 1 Application topically 2 (two) times daily. To affected area until healed.    Dispense:  30 g    Refill:  2   fluconazole (DIFLUCAN) 150 MG tablet    Sig: Take 1 tablet (150 mg total) by mouth once for 1 dose. May take an additional dose after 3 days if still symptomatic.    Dispense:  3 tablet    Refill:  0   tirzepatide (MOUNJARO) 5 MG/0.5ML Pen    Sig: Inject 5 mg into the skin once a week.    Dispense:  2 mL    Refill:  3    Please send prior auth if required aspa thanks, dx code E11.65    Return in about 4 weeks (around 01/28/2023) for F/U, Murray Guzzetta PCP.   Total time spent:30 Minutes Time spent includes review of chart, medications, test results, and follow up plan with the patient.   [place] Controlled  Substance Database was reviewed by me.  This patient was seen by Jonetta Osgood, FNP-C in collaboration with Dr. Clayborn Bigness as a part of collaborative care agreement.  Diamante Truszkowski R. Valetta Fuller, MSN, FNP-C Internal medicine

## 2023-01-01 LAB — UA/M W/RFLX CULTURE, ROUTINE
Bilirubin, UA: NEGATIVE
Ketones, UA: NEGATIVE
Leukocytes,UA: NEGATIVE
Nitrite, UA: NEGATIVE
Protein,UA: NEGATIVE
RBC, UA: NEGATIVE
Specific Gravity, UA: 1.027 (ref 1.005–1.030)
Urobilinogen, Ur: 0.2 mg/dL (ref 0.2–1.0)
pH, UA: 5.5 (ref 5.0–7.5)

## 2023-01-01 LAB — MICROSCOPIC EXAMINATION
Casts: NONE SEEN /lpf
Epithelial Cells (non renal): >10 /hpf — AB (ref 0–10)
RBC, Urine: NONE SEEN /hpf (ref 0–2)

## 2023-01-28 ENCOUNTER — Ambulatory Visit: Payer: BC Managed Care – PPO | Admitting: Nurse Practitioner

## 2023-02-14 ENCOUNTER — Telehealth: Payer: Self-pay | Admitting: Nurse Practitioner

## 2023-02-14 NOTE — Telephone Encounter (Signed)
Patient never heard from [facility]. I sent referral via proficient to [facility]

## 2023-02-21 ENCOUNTER — Ambulatory Visit (INDEPENDENT_AMBULATORY_CARE_PROVIDER_SITE_OTHER): Payer: BC Managed Care – PPO | Admitting: Nurse Practitioner

## 2023-02-21 ENCOUNTER — Encounter: Payer: Self-pay | Admitting: Nurse Practitioner

## 2023-02-21 VITALS — BP 138/88 | HR 72 | Temp 98.3°F | Resp 16 | Ht 64.0 in | Wt 217.4 lb

## 2023-02-21 DIAGNOSIS — E1165 Type 2 diabetes mellitus with hyperglycemia: Secondary | ICD-10-CM

## 2023-02-21 DIAGNOSIS — E782 Mixed hyperlipidemia: Secondary | ICD-10-CM

## 2023-02-21 DIAGNOSIS — Z79899 Other long term (current) drug therapy: Secondary | ICD-10-CM

## 2023-02-21 DIAGNOSIS — I10 Essential (primary) hypertension: Secondary | ICD-10-CM

## 2023-02-21 MED ORDER — AMLODIPINE BESY-BENAZEPRIL HCL 5-20 MG PO CAPS: 1.0000 | ORAL_CAPSULE | Freq: Every day | ORAL | 1 refills | Status: DC

## 2023-02-21 MED ORDER — ROSUVASTATIN CALCIUM 20 MG PO TABS: 20.0000 mg | ORAL_TABLET | Freq: Every day | ORAL | 1 refills | Status: DC

## 2023-02-21 MED ORDER — DAPAGLIFLOZIN PROPANEDIOL 10 MG PO TABS: 10.0000 mg | ORAL_TABLET | Freq: Every day | ORAL | 5 refills | Status: DC

## 2023-02-21 NOTE — Progress Notes (Unsigned)
[facility] [address] [place], [place] [address]  Internal MEDICINE  Office Visit Note  Patient Name: Tammy Beasley  [id number]  [id number]  Date of Service: 02/21/2023  Chief Complaint  Patient presents with   Diabetes   Hypertension   Follow-up    HPI Tammy Beasley presents for a follow-up visit for Diabetes -- glucose 120-130 Feeling much better Not as sleepy and sluggish    Current Medication: Outpatient Encounter Medications as of 02/21/2023  Medication Sig   Accu-Chek Softclix Lancets lancets Use as instructed to check blood sugars twice a day  E11.65   albuterol (VENTOLIN HFA) 108 (90 Base) MCG/ACT inhaler Inhale 2 puffs into the lungs every 6 (six) hours as needed for wheezing or shortness of breath.   Blood Glucose Monitoring Suppl (ACCU-CHEK GUIDE) w/Device KIT Use as directed DXE11.65   glucose blood (ACCU-CHEK GUIDE) test strip Use as directed to check blood sugars twice a day  E11.65   mupirocin ointment (BACTROBAN) 2 % Apply 1 Application topically 2 (two) times daily. To affected area until healed.   terbinafine (LAMISIL) 250 MG tablet Take 1 tablet (250 mg total) by mouth daily.   tirzepatide [facility]) 5 MG/0.5ML Pen Inject 5 mg into the skin once a week.   triamcinolone ointment (KENALOG) 0.5 % Apply 1 Application topically 2 (two) times daily.   Vitamin D, Ergocalciferol, (DRISDOL) 1.25 MG (50000 UNIT) CAPS capsule Take 1 capsule (50,000 Units total) by mouth every 7 (seven) days.   [DISCONTINUED] amLODipine-benazepril (LOTREL) 5-20 MG capsule Take 1 capsule by mouth daily.   [DISCONTINUED] dapagliflozin propanediol (FARXIGA) 10 MG TABS tablet Take 1 tablet (10 mg total) by mouth daily before breakfast.   [DISCONTINUED] rosuvastatin (CRESTOR) 20 MG tablet Take 1 tablet (20 mg total) by mouth daily.   amLODipine-benazepril (LOTREL) 5-20 MG capsule Take 1 capsule by mouth daily.   dapagliflozin propanediol (FARXIGA) 10 MG TABS tablet Take 1  tablet (10 mg total) by mouth daily before breakfast.   rosuvastatin (CRESTOR) 20 MG tablet Take 1 tablet (20 mg total) by mouth daily.   No facility-administered encounter medications on file as of 02/21/2023.    Surgical History: Past Surgical History:  Procedure Laterality Date   BREAST LUMPECTOMY     celluilitus      Medical History: Past Medical History:  Diagnosis Date   Diabetes mellitus without complication (HCC)    Hypertension     Family History: Family History  Problem Relation Age of Onset   Renal Disease Father    Kidney failure Father    Diabetes Father     Social History   Socioeconomic History   Marital status: Single    Spouse name: Not on file   Number of children: Not on file   Years of education: Not on file   Highest education level: Not on file  Occupational History   Not on file  Tobacco Use   Smoking status: Every Day    Types: Cigarettes   Smokeless tobacco: Never   Tobacco comments:    6 cigarettes daily  Substance and Sexual Activity   Alcohol use: Not Currently   Drug use: Never   Sexual activity: Not on file  Other Topics Concern   Not on file  Social History Narrative   Not on file   Social Determinants of Health   Financial Resource Strain: Not on file  Food Insecurity: Not on file  Transportation Needs: Not on file  Physical Activity: Not on  file  Stress: Not on file  Social Connections: Not on file  Intimate Partner Violence: Not on file      Review of Systems  Vital Signs: BP 138/88   Pulse 72   Temp 98.3 F (36.8 C)   Resp 16   Ht 5\' 4"  (1.626 m)   Wt 217 lb 6.4 oz (98.6 kg)   SpO2 99%   BMI 37.32 kg/m    Physical Exam     Assessment/Plan:   General Counseling: Tammy Beasley verbalizes understanding of the findings of todays visit and agrees with plan of treatment. I have discussed any further diagnostic evaluation that may be needed or ordered today. We also reviewed her medications today. she has  been encouraged to call the office with any questions or concerns that should arise related to todays visit.    No orders of the defined types were placed in this encounter.   Meds ordered this encounter  Medications   dapagliflozin propanediol (FARXIGA) 10 MG TABS tablet    Sig: Take 1 tablet (10 mg total) by mouth daily before breakfast.    Dispense:  30 tablet    Refill:  5    Please put refills on file   amLODipine-benazepril (LOTREL) 5-20 MG capsule    Sig: Take 1 capsule by mouth daily.    Dispense:  90 capsule    Refill:  1    Please put refills on file   rosuvastatin (CRESTOR) 20 MG tablet    Sig: Take 1 tablet (20 mg total) by mouth daily.    Dispense:  90 tablet    Refill:  1    Please put refills on file    Return in about 6 weeks (around 04/01/2023) for F/U, Recheck A1C, Wetona Viramontes PCP.   Total time spent:*** Minutes Time spent includes review of chart, medications, test results, and follow up plan with the patient.   [place] Controlled Substance Database was reviewed by me.  This patient was seen by Sallyanne Kuster, FNP-C in collaboration with Dr. Beverely Risen as a part of collaborative care agreement.   Reynold Mantell R. Tedd Sias, MSN, FNP-C Internal medicine

## 2023-03-01 ENCOUNTER — Ambulatory Visit: Payer: BC Managed Care – PPO | Admitting: Nurse Practitioner

## 2023-03-01 ENCOUNTER — Encounter: Payer: Self-pay | Admitting: Nurse Practitioner

## 2023-03-01 ENCOUNTER — Telehealth: Payer: Self-pay | Admitting: Nurse Practitioner

## 2023-03-01 VITALS — BP 150/88 | HR 71 | Temp 98.1°F | Resp 16 | Ht 64.0 in | Wt 216.0 lb

## 2023-03-01 DIAGNOSIS — R300 Dysuria: Secondary | ICD-10-CM | POA: Diagnosis not present

## 2023-03-01 DIAGNOSIS — R319 Hematuria, unspecified: Secondary | ICD-10-CM

## 2023-03-01 DIAGNOSIS — N3001 Acute cystitis with hematuria: Secondary | ICD-10-CM | POA: Diagnosis not present

## 2023-03-01 DIAGNOSIS — N390 Urinary tract infection, site not specified: Secondary | ICD-10-CM | POA: Diagnosis not present

## 2023-03-01 LAB — POCT URINALYSIS DIPSTICK
Bilirubin, UA: NEGATIVE
Glucose, UA: POSITIVE — AB
Nitrite, UA: NEGATIVE
Protein, UA: NEGATIVE
Spec Grav, UA: <=1.005 — AB (ref 1.010–1.025)
Urobilinogen, UA: 0.2 E.U./dL
pH, UA: 7 (ref 5.0–8.0)

## 2023-03-01 MED ORDER — NITROFURANTOIN MONOHYD MACRO 100 MG PO CAPS
100.0000 mg | ORAL_CAPSULE | Freq: Two times a day (BID) | ORAL | 0 refills | Status: AC
Start: 2023-03-01 — End: 2023-03-08

## 2023-03-01 MED ORDER — PHENAZOPYRIDINE HCL 200 MG PO TABS
200.0000 mg | ORAL_TABLET | Freq: Three times a day (TID) | ORAL | 0 refills | Status: DC | PRN
Start: 2023-03-01 — End: 2023-07-20

## 2023-03-01 NOTE — Telephone Encounter (Signed)
Per Sarah w/ [facility], referral has been closed due to patient not replying to calls to schedule-Toni 

## 2023-03-01 NOTE — Progress Notes (Signed)
[facility] [address] [place], [place] [address]  Internal MEDICINE  Office Visit Note  Patient Name: Tammy Beasley  [id number]  [id number]  Date of Service: 03/01/2023  Chief Complaint  Patient presents with   Acute Visit    Uti      HPI Kiryn presents for an acute sick visit for symptoms of UTI Symptoms started last week Having flank pain, frequency, urgency, back pain, urinary discomfort and suprapubic tenderness.  Urinalysis positive for leukocytes and blood.     Current Medication:  Outpatient Encounter Medications as of 03/01/2023  Medication Sig   Accu-Chek Softclix Lancets lancets Use as instructed to check blood sugars twice a day  E11.65   albuterol (VENTOLIN HFA) 108 (90 Base) MCG/ACT inhaler Inhale 2 puffs into the lungs every 6 (six) hours as needed for wheezing or shortness of breath.   amLODipine-benazepril (LOTREL) 5-20 MG capsule Take 1 capsule by mouth daily.   Blood Glucose Monitoring Suppl (ACCU-CHEK GUIDE) w/Device KIT Use as directed DXE11.65   dapagliflozin propanediol (FARXIGA) 10 MG TABS tablet Take 1 tablet (10 mg total) by mouth daily before breakfast.   glucose blood (ACCU-CHEK GUIDE) test strip Use as directed to check blood sugars twice a day  E11.65   mupirocin ointment (BACTROBAN) 2 % Apply 1 Application topically 2 (two) times daily. To affected area until healed.   nitrofurantoin, macrocrystal-monohydrate, (MACROBID) 100 MG capsule Take 1 capsule (100 mg total) by mouth 2 (two) times daily for 7 days. Take with food   phenazopyridine (PYRIDIUM) 200 MG tablet Take 1 tablet (200 mg total) by mouth 3 (three) times daily as needed for pain.   rosuvastatin (CRESTOR) 20 MG tablet Take 1 tablet (20 mg total) by mouth daily.   terbinafine (LAMISIL) 250 MG tablet Take 1 tablet (250 mg total) by mouth daily.   tirzepatide [facility]) 5 MG/0.5ML Pen Inject 5 mg into the skin once a week.   triamcinolone ointment (KENALOG) 0.5 % Apply  1 Application topically 2 (two) times daily.   Vitamin D, Ergocalciferol, (DRISDOL) 1.25 MG (50000 UNIT) CAPS capsule Take 1 capsule (50,000 Units total) by mouth every 7 (seven) days.   No facility-administered encounter medications on file as of 03/01/2023.      Medical History: Past Medical History:  Diagnosis Date   Diabetes mellitus without complication (HCC)    Hypertension      Vital Signs: BP (!) 150/88   Pulse 71   Temp 98.1 F (36.7 C)   Resp 16   Ht 5\' 4"  (1.626 m)   Wt 216 lb (98 kg)   SpO2 98%   BMI 37.08 kg/m    Review of Systems  Constitutional: Negative.  Negative for fatigue.  HENT: Negative.    Respiratory: Negative.    Cardiovascular: Negative.   Genitourinary:  Positive for dysuria, flank pain, frequency, pelvic pain and urgency.  Musculoskeletal:  Positive for back pain.  Neurological: Negative.   Psychiatric/Behavioral: Negative.      Physical Exam Vitals reviewed.  Constitutional:      General: She is not in acute distress.    Appearance: Normal appearance. She is obese. She is not ill-appearing.  HENT:     Head: Normocephalic and atraumatic.  Eyes:     Pupils: Pupils are equal, round, and reactive to light.  Cardiovascular:     Rate and Rhythm: Normal rate and regular rhythm.  Pulmonary:     Effort: Pulmonary effort is normal. No respiratory distress.  Neurological:     Mental Status: She is alert and oriented to person, place, and time.  Psychiatric:        Mood and Affect: Mood normal.        Behavior: Behavior normal.       Assessment/Plan: 1. Acute cystitis with hematuria Macrobid prescribed to treat UTI, urine culture sent and pyridium prescribed for urinary discomfort.  - CULTURE, URINE COMPREHENSIVE - nitrofurantoin, macrocrystal-monohydrate, (MACROBID) 100 MG capsule; Take 1 capsule (100 mg total) by mouth 2 (two) times daily for 7 days. Take with food  Dispense: 14 capsule; Refill: 0 - phenazopyridine (PYRIDIUM) 200  MG tablet; Take 1 tablet (200 mg total) by mouth 3 (three) times daily as needed for pain.  Dispense: 15 tablet; Refill: 0  2. Dysuria Urinalysis positive for blood and leukocytes, urine culture sent.  - POCT urinalysis dipstick - CULTURE, URINE COMPREHENSIVE   General Counseling: Bowie verbalizes understanding of the findings of todays visit and agrees with plan of treatment. I have discussed any further diagnostic evaluation that may be needed or ordered today. We also reviewed her medications today. she has been encouraged to call the office with any questions or concerns that should arise related to todays visit.    Counseling:    Orders Placed This Encounter  Procedures   CULTURE, URINE COMPREHENSIVE   POCT urinalysis dipstick    Meds ordered this encounter  Medications   nitrofurantoin, macrocrystal-monohydrate, (MACROBID) 100 MG capsule    Sig: Take 1 capsule (100 mg total) by mouth 2 (two) times daily for 7 days. Take with food    Dispense:  14 capsule    Refill:  0    Fill today   phenazopyridine (PYRIDIUM) 200 MG tablet    Sig: Take 1 tablet (200 mg total) by mouth 3 (three) times daily as needed for pain.    Dispense:  15 tablet    Refill:  0    Return if symptoms worsen or fail to improve.  [place] Controlled Substance Database was reviewed by me for overdose risk score (ORS)  Time spent:20 Minutes Time spent with patient included reviewing progress notes, labs, imaging studies, and discussing plan for follow up.   This patient was seen by Sallyanne Kuster, FNP-C in collaboration with Dr. Beverely Risen as a part of collaborative care agreement.  Venice Marcucci R. Tedd Sias, MSN, FNP-C Internal Medicine

## 2023-03-03 ENCOUNTER — Encounter: Payer: Self-pay | Admitting: Nurse Practitioner

## 2023-03-03 DIAGNOSIS — E1159 Type 2 diabetes mellitus with other circulatory complications: Secondary | ICD-10-CM | POA: Insufficient documentation

## 2023-03-03 DIAGNOSIS — I10 Essential (primary) hypertension: Secondary | ICD-10-CM | POA: Insufficient documentation

## 2023-03-07 ENCOUNTER — Telehealth: Payer: Self-pay | Admitting: Nurse Practitioner

## 2023-03-07 LAB — CULTURE, URINE COMPREHENSIVE

## 2023-03-07 NOTE — Telephone Encounter (Addendum)
Lvm and sent message to patient to return my call regarding GI referral sent out in March. [facility] has left several messages with no return call-Toni

## 2023-03-15 ENCOUNTER — Encounter: Payer: Self-pay | Admitting: *Deleted

## 2023-04-08 ENCOUNTER — Ambulatory Visit: Payer: BC Managed Care – PPO | Admitting: Nurse Practitioner

## 2023-04-10 ENCOUNTER — Ambulatory Visit: Payer: BC Managed Care – PPO | Admitting: Nurse Practitioner

## 2023-04-10 ENCOUNTER — Encounter: Payer: Self-pay | Admitting: Nurse Practitioner

## 2023-04-10 VITALS — BP 138/72 | HR 71 | Temp 98.3°F | Resp 16 | Ht 64.0 in | Wt 221.0 lb

## 2023-04-10 DIAGNOSIS — E782 Mixed hyperlipidemia: Secondary | ICD-10-CM

## 2023-04-10 DIAGNOSIS — R300 Dysuria: Secondary | ICD-10-CM

## 2023-04-10 DIAGNOSIS — E1165 Type 2 diabetes mellitus with hyperglycemia: Secondary | ICD-10-CM

## 2023-04-10 DIAGNOSIS — N390 Urinary tract infection, site not specified: Secondary | ICD-10-CM

## 2023-04-10 DIAGNOSIS — I10 Essential (primary) hypertension: Secondary | ICD-10-CM

## 2023-04-10 DIAGNOSIS — R319 Hematuria, unspecified: Secondary | ICD-10-CM

## 2023-04-10 LAB — POCT GLYCOSYLATED HEMOGLOBIN (HGB A1C): Hemoglobin A1C: 9.7 % — AB (ref 4.0–5.6)

## 2023-04-10 LAB — POCT URINALYSIS DIPSTICK
Bilirubin, UA: NEGATIVE
Glucose, UA: POSITIVE — AB
Leukocytes, UA: NEGATIVE
Nitrite, UA: NEGATIVE
Protein, UA: NEGATIVE
Spec Grav, UA: 1.01 (ref 1.010–1.025)
Urobilinogen, UA: 0.2 E.U./dL
pH, UA: 5 (ref 5.0–8.0)

## 2023-04-10 MED ORDER — MOUNJARO 5 MG/0.5ML ~~LOC~~ SOAJ
5.0000 mg | SUBCUTANEOUS | 3 refills | Status: DC
Start: 2023-04-10 — End: 2023-07-09

## 2023-04-10 MED ORDER — ACCU-CHEK GUIDE VI STRP: ORAL_STRIP | 12 refills | Status: DC

## 2023-04-10 MED ORDER — ACCU-CHEK SOFTCLIX LANCETS MISC: 12 refills | Status: DC

## 2023-04-10 NOTE — Progress Notes (Signed)
[facility] [address] [place], [place] [address]  Internal MEDICINE  Office Visit Note  Patient Name: Tammy Beasley  [id number]  [id number]  Date of Service: 04/10/2023  Chief Complaint  Patient presents with   Diabetes   Hypertension   Follow-up    HPI Srihitha presents for a follow-up visit for diabetes, possible UTI and hypertension Diabetes -- doing well on current dose of mounjaro at 5 mg weekly. A1c improved to 9.7. also currently taking farxiga. Checking glucose at least once daily fasting in the morning.  Possible UTI -- trace blood on urinalysis Hypertension -- elevated BP, rechecked and Improved. Taking amlodipine-benazepril daily.  High cholesterol and CVD prevention in diabetes -- taking rosuvastatin 20 mg daily.     Current Medication: Outpatient Encounter Medications as of 04/10/2023  Medication Sig   albuterol (VENTOLIN HFA) 108 (90 Base) MCG/ACT inhaler Inhale 2 puffs into the lungs every 6 (six) hours as needed for wheezing or shortness of breath.   amLODipine-benazepril (LOTREL) 5-20 MG capsule Take 1 capsule by mouth daily.   Blood Glucose Monitoring Suppl (ACCU-CHEK GUIDE) w/Device KIT Use as directed DXE11.65   dapagliflozin propanediol (FARXIGA) 10 MG TABS tablet Take 1 tablet (10 mg total) by mouth daily before breakfast.   mupirocin ointment (BACTROBAN) 2 % Apply 1 Application topically 2 (two) times daily. To affected area until healed.   phenazopyridine (PYRIDIUM) 200 MG tablet Take 1 tablet (200 mg total) by mouth 3 (three) times daily as needed for pain.   rosuvastatin (CRESTOR) 20 MG tablet Take 1 tablet (20 mg total) by mouth daily.   triamcinolone ointment (KENALOG) 0.5 % Apply 1 Application topically 2 (two) times daily.   Vitamin D, Ergocalciferol, (DRISDOL) 1.25 MG (50000 UNIT) CAPS capsule Take 1 capsule (50,000 Units total) by mouth every 7 (seven) days.   [DISCONTINUED] Accu-Chek Softclix Lancets lancets Use as instructed to  check blood sugars twice a day  E11.65   [DISCONTINUED] glucose blood (ACCU-CHEK GUIDE) test strip Use as directed to check blood sugars twice a day  E11.65   [DISCONTINUED] tirzepatide (MOUNJARO) 5 MG/0.5ML Pen Inject 5 mg into the skin once a week.   Accu-Chek Softclix Lancets lancets Use as instructed to check blood sugars twice a day  E11.65   glucose blood (ACCU-CHEK GUIDE) test strip Use as directed to check blood sugars twice a day  E11.65   tirzepatide (MOUNJARO) 5 MG/0.5ML Pen Inject 5 mg into the skin once a week.   No facility-administered encounter medications on file as of 04/10/2023.    Surgical History: Past Surgical History:  Procedure Laterality Date   BREAST LUMPECTOMY     celluilitus      Medical History: Past Medical History:  Diagnosis Date   Diabetes mellitus without complication (HCC)    Hypertension     Family History: Family History  Problem Relation Age of Onset   Renal Disease Father    Kidney failure Father    Diabetes Father     Social History   Socioeconomic History   Marital status: Single    Spouse name: Not on file   Number of children: Not on file   Years of education: Not on file   Highest education level: Not on file  Occupational History   Not on file  Tobacco Use   Smoking status: Every Day    Types: Cigarettes   Smokeless tobacco: Never   Tobacco comments:    6 cigarettes daily  Substance and  Sexual Activity   Alcohol use: Not Currently   Drug use: Never   Sexual activity: Not on file  Other Topics Concern   Not on file  Social History Narrative   Not on file   Social Determinants of Health   Financial Resource Strain: Not on file  Food Insecurity: Not on file  Transportation Needs: Not on file  Physical Activity: Not on file  Stress: Not on file  Social Connections: Not on file  Intimate Partner Violence: Not on file      Review of Systems  Constitutional:  Positive for appetite change and unexpected weight  change. Negative for chills and fatigue.  HENT:  Negative for congestion, rhinorrhea, sneezing and sore throat.   Eyes:  Negative for redness.  Respiratory: Negative.  Negative for cough, chest tightness, shortness of breath and wheezing.   Cardiovascular: Negative.  Negative for chest pain and palpitations.  Gastrointestinal:  Negative for abdominal pain, constipation, diarrhea, nausea and vomiting.  Endocrine: Negative for polydipsia and polyuria.  Genitourinary:  Negative for dysuria and frequency.  Musculoskeletal:  Negative for arthralgias, back pain, joint swelling and neck pain.  Skin:  Negative for rash.  Neurological: Negative.  Negative for tremors and numbness.  Hematological:  Negative for adenopathy. Does not bruise/bleed easily.  Psychiatric/Behavioral:  Negative for behavioral problems (Depression), sleep disturbance and suicidal ideas. The patient is not nervous/anxious.     Vital Signs: BP 138/72 Comment: 160/90  Pulse 71   Temp 98.3 F (36.8 C)   Resp 16   Ht 5\' 4"  (1.626 m)   Wt 221 lb (100.2 kg)   SpO2 97%   BMI 37.93 kg/m    Physical Exam Vitals reviewed.  Constitutional:      General: She is not in acute distress.    Appearance: Normal appearance. She is obese. She is not ill-appearing.  HENT:     Head: Normocephalic and atraumatic.  Eyes:     Pupils: Pupils are equal, round, and reactive to light.  Cardiovascular:     Rate and Rhythm: Normal rate and regular rhythm.  Pulmonary:     Effort: Pulmonary effort is normal. No respiratory distress.  Neurological:     Mental Status: She is alert and oriented to person, place, and time.  Psychiatric:        Mood and Affect: Mood normal.        Behavior: Behavior normal.        Assessment/Plan: 1. Type 2 diabetes mellitus with hyperglycemia, without long-term current use of insulin (HCC) A1c is improving, continue checking fasting glucose daily. Continue mounjaro and farxiga as prescribed.  - POCT  glycosylated hemoglobin (Hb A1C) - tirzepatide (MOUNJARO) 5 MG/0.5ML Pen; Inject 5 mg into the skin once a week.  Dispense: 2 mL; Refill: 3 - Accu-Chek Softclix Lancets lancets; Use as instructed to check blood sugars twice a day  E11.65  Dispense: 100 each; Refill: 12 - glucose blood (ACCU-CHEK GUIDE) test strip; Use as directed to check blood sugars twice a day  E11.65  Dispense: 100 each; Refill: 12  2. Essential hypertension Continue amlodipine-benazepril as prescribed.   3. Mixed hyperlipidemia Continue rosuvastatin as prescribed.   4. Dysuria Urinalysis positive for trace blood and nothing else, will send for culture to determine if antibiotic treatment is necessary.  - POCT urinalysis dipstick - CULTURE, URINE COMPREHENSIVE   General Counseling: Daya verbalizes understanding of the findings of todays visit and agrees with plan of treatment. I have discussed any  further diagnostic evaluation that may be needed or ordered today. We also reviewed her medications today. she has been encouraged to call the office with any questions or concerns that should arise related to todays visit.    Orders Placed This Encounter  Procedures   CULTURE, URINE COMPREHENSIVE   POCT glycosylated hemoglobin (Hb A1C)   POCT urinalysis dipstick    Meds ordered this encounter  Medications   tirzepatide (MOUNJARO) 5 MG/0.5ML Pen    Sig: Inject 5 mg into the skin once a week.    Dispense:  2 mL    Refill:  3    Please send prior auth if required aspa thanks, dx code E11.65   Accu-Chek Softclix Lancets lancets    Sig: Use as instructed to check blood sugars twice a day  E11.65    Dispense:  100 each    Refill:  12   glucose blood (ACCU-CHEK GUIDE) test strip    Sig: Use as directed to check blood sugars twice a day  E11.65    Dispense:  100 each    Refill:  12    Return in about 14 weeks (around 07/17/2023) for F/U, Recheck A1C, Venetia Prewitt PCP.   Total time spent:30 Minutes Time spent  includes review of chart, medications, test results, and follow up plan with the patient.    Controlled Substance Database was reviewed by me.  This patient was seen by Sallyanne Kuster, FNP-C in collaboration with Dr. Beverely Risen as a part of collaborative care agreement.   Yordy Matton R. Tedd Sias, MSN, FNP-C Internal medicine

## 2023-04-13 ENCOUNTER — Encounter: Payer: Self-pay | Admitting: Nurse Practitioner

## 2023-04-16 LAB — CULTURE, URINE COMPREHENSIVE

## 2023-06-13 ENCOUNTER — Telehealth: Payer: Self-pay

## 2023-07-02 ENCOUNTER — Telehealth: Payer: Self-pay

## 2023-07-02 NOTE — Telephone Encounter (Signed)
Pt  called that she out of Mounjaro last few weeks she only taking [place] and her glucose is high 2 hrs after supper 200 and in morning 150 as per dr Welton Flakes advised that gave her samples for RYBELSUS 3 mg take I tab po daily and  continue farxiga and advised hr bring her glucose log  and made her appt with DFK

## 2023-07-09 ENCOUNTER — Ambulatory Visit: Payer: BC Managed Care – PPO | Admitting: Internal Medicine

## 2023-07-09 ENCOUNTER — Encounter: Payer: Self-pay | Admitting: Internal Medicine

## 2023-07-09 VITALS — BP 122/90 | HR 52 | Temp 98.2°F | Resp 16 | Ht 64.0 in | Wt 215.0 lb

## 2023-07-09 DIAGNOSIS — Z794 Long term (current) use of insulin: Secondary | ICD-10-CM

## 2023-07-09 DIAGNOSIS — E782 Mixed hyperlipidemia: Secondary | ICD-10-CM | POA: Diagnosis not present

## 2023-07-09 DIAGNOSIS — I10 Essential (primary) hypertension: Secondary | ICD-10-CM

## 2023-07-09 DIAGNOSIS — F17219 Nicotine dependence, cigarettes, with unspecified nicotine-induced disorders: Secondary | ICD-10-CM | POA: Diagnosis not present

## 2023-07-09 DIAGNOSIS — E1165 Type 2 diabetes mellitus with hyperglycemia: Secondary | ICD-10-CM | POA: Diagnosis not present

## 2023-07-09 LAB — POCT GLYCOSYLATED HEMOGLOBIN (HGB A1C): Hemoglobin A1C: 11.3 % — AB (ref 4.0–5.6)

## 2023-07-09 LAB — GLUCOSE, POCT (MANUAL RESULT ENTRY): POC Glucose: 321 mg/dL — AB (ref 70–99)

## 2023-07-09 MED ORDER — TRULICITY 0.75 MG/0.5ML ~~LOC~~ SOAJ: SUBCUTANEOUS | 3 refills | Status: DC

## 2023-07-09 MED ORDER — INSULIN DEGLUDEC 100 UNIT/ML ~~LOC~~ SOPN: PEN_INJECTOR | SUBCUTANEOUS | 3 refills | Status: DC

## 2023-07-09 NOTE — Telephone Encounter (Signed)
Patient was seen today in office and still waiting on insurance.

## 2023-07-09 NOTE — Progress Notes (Signed)
[facility] [address] [place], [place] [address]  Internal MEDICINE  Office Visit Note  Patient Name: ERNEST SAK  [id number]  [id number]  Date of Service: 07/22/2023  Chief Complaint  Patient presents with   Follow-up   Diabetes   Hypertension   Medication Management    Still waiting on insurance to approve Mounjaro   Smoking Cessation Screening    Patient interested in quitting smoking    HPI Pt is seen today for acute visit Uncontrolled diabetes, she is not using her insulin and other medications as prescribed Continues to smoke as well  Pt aslo has HTN    Current Medication: Outpatient Encounter Medications as of 07/09/2023  Medication Sig   Accu-Chek Softclix Lancets lancets Use as instructed to check blood sugars twice a day  E11.65   albuterol (VENTOLIN HFA) 108 (90 Base) MCG/ACT inhaler Inhale 2 puffs into the lungs every 6 (six) hours as needed for wheezing or shortness of breath.   Blood Glucose Monitoring Suppl (ACCU-CHEK GUIDE) w/Device KIT Use as directed DXE11.65   dapagliflozin propanediol (FARXIGA) 10 MG TABS tablet Take 1 tablet (10 mg total) by mouth daily before breakfast.   glucose blood (ACCU-CHEK GUIDE) test strip Use as directed to check blood sugars twice a day  E11.65   mupirocin ointment (BACTROBAN) 2 % Apply 1 Application topically 2 (two) times daily. To affected area until healed. (Patient not taking: Reported on 07/19/2023)   rosuvastatin (CRESTOR) 20 MG tablet Take 1 tablet (20 mg total) by mouth daily.   triamcinolone ointment (KENALOG) 0.5 % Apply 1 Application topically 2 (two) times daily.   Vitamin D, Ergocalciferol, (DRISDOL) 1.25 MG (50000 UNIT) CAPS capsule Take 1 capsule (50,000 Units total) by mouth every 7 (seven) days.   [DISCONTINUED] amLODipine-benazepril (LOTREL) 5-20 MG capsule Take 1 capsule by mouth daily.   [DISCONTINUED] Dulaglutide (TRULICITY) 0.75 MG/0.5ML SOPN Take 0.75 mg q week for dm (Patient not  taking: Reported on 07/19/2023)   [DISCONTINUED] insulin degludec (TRESIBA) 100 UNIT/ML FlexTouch Pen Inject 8 units sq in am and will increase by 2 units as directed   [DISCONTINUED] phenazopyridine (PYRIDIUM) 200 MG tablet Take 1 tablet (200 mg total) by mouth 3 (three) times daily as needed for pain. (Patient not taking: Reported on 07/19/2023)   [DISCONTINUED] tirzepatide [facility]) 5 MG/0.5ML Pen Inject 5 mg into the skin once a week.   No facility-administered encounter medications on file as of 07/09/2023.    Surgical History: Past Surgical History:  Procedure Laterality Date   BREAST LUMPECTOMY     celluilitus      Medical History: Past Medical History:  Diagnosis Date   Diabetes mellitus without complication (HCC)    Hypertension     Family History: Family History  Problem Relation Age of Onset   Renal Disease Father    Kidney failure Father    Diabetes Father     Social History   Socioeconomic History   Marital status: Single    Spouse name: Not on file   Number of children: Not on file   Years of education: Not on file   Highest education level: Not on file  Occupational History   Not on file  Tobacco Use   Smoking status: Every Day    Types: Cigarettes   Smokeless tobacco: Never   Tobacco comments:    6 cigarettes daily  Substance and Sexual Activity   Alcohol use: Not Currently   Drug use: Never   Sexual  activity: Not on file  Other Topics Concern   Not on file  Social History Narrative   Not on file   Social Determinants of Health   Financial Resource Strain: Not on file  Food Insecurity: Not on file  Transportation Needs: Not on file  Physical Activity: Not on file  Stress: Not on file  Social Connections: Not on file  Intimate Partner Violence: Not on file      Review of Systems  Constitutional:  Negative for fatigue and fever.  HENT:  Negative for congestion, mouth sores and postnasal drip.   Respiratory:  Negative for cough.    Cardiovascular:  Negative for chest pain.  Genitourinary:  Negative for flank pain.  Psychiatric/Behavioral: Negative.      Vital Signs: BP (!) 122/90   Pulse (!) 52   Temp 98.2 F (36.8 C)   Resp 16   Ht 5\' 4"  (1.626 m)   Wt 215 lb (97.5 kg)   SpO2 99%   BMI 36.90 kg/m    Physical Exam Constitutional:      Appearance: Normal appearance.  HENT:     Head: Normocephalic and atraumatic.     Nose: Nose normal.     Mouth/Throat:     Mouth: Mucous membranes are moist.     Pharynx: No posterior oropharyngeal erythema.  Eyes:     Extraocular Movements: Extraocular movements intact.     Pupils: Pupils are equal, round, and reactive to light.  Cardiovascular:     Pulses: Normal pulses.     Heart sounds: Normal heart sounds.  Pulmonary:     Effort: Pulmonary effort is normal.     Breath sounds: Normal breath sounds.  Neurological:     General: No focal deficit present.     Mental Status: She is alert.  Psychiatric:        Mood and Affect: Mood normal.        Behavior: Behavior normal.        Assessment/Plan: 1. Type 2 diabetes mellitus with hyperglycemia, with long-term current use of insulin (HCC) Uncontrolled DM, will add long acting insulin, samples of Rybelsus are given, will take 3 mg and then increase to 6 mg after one week, await Trulicity ( she can take one or the other between Rybelsus and trulicity depending on insurance coverage) Continue Farxiga  - POCT HgB A1C - POCT Glucose (CBG)  2. Mixed hyperlipidemia Continue on Zetia and Crestor   3. Essential hypertension Controlled with meds   4. Cigarette nicotine dependence with nicotine-induced disorder Will discuss on next visit    General Counseling: Kandiss verbalizes understanding of the findings of todays visit and agrees with plan of treatment. I have discussed any further diagnostic evaluation that may be needed or ordered today. We also reviewed her medications today. she has been encouraged to  call the office with any questions or concerns that should arise related to todays visit.    Orders Placed This Encounter  Procedures   POCT HgB A1C   POCT Glucose (CBG)    Meds ordered this encounter  Medications   DISCONTD: Dulaglutide (TRULICITY) 0.75 MG/0.5ML SOPN    Sig: Take 0.75 mg q week for dm    Dispense:  6 mL    Refill:  3   DISCONTD: insulin degludec (TRESIBA) 100 UNIT/ML FlexTouch Pen    Sig: Inject 8 units sq in am and will increase by 2 units as directed    Dispense:  6 mL  Refill:  3    Total time spent:45 Minutes Time spent includes review of chart, medications, test results, and follow up plan with the patient.   [place] Controlled Substance Database was reviewed by me.   Dr Lyndon Code Internal medicine

## 2023-07-10 ENCOUNTER — Other Ambulatory Visit: Payer: Self-pay

## 2023-07-10 MED ORDER — INSULIN DEGLUDEC 100 UNIT/ML ~~LOC~~ SOPN: PEN_INJECTOR | SUBCUTANEOUS | 3 refills | Status: DC

## 2023-07-16 ENCOUNTER — Telehealth: Payer: Self-pay

## 2023-07-16 NOTE — Telephone Encounter (Signed)
Pt called that she was confused about diabetic med as per dr Welton Flakes advised that her tresiba 8 unit  in morning and increased 2 units as directed if her sugar is higher than 180 and she can increased  tresiba 2 units until  her sugar equal or less than 180 and also once she pickup Trulicity stopped RYBELSUS  until then she can take RYBELSUS 6 mg  and continue [place]  and also if she cannot get Trulicity then we can send RYBELSUS 7 mg

## 2023-07-17 ENCOUNTER — Ambulatory Visit: Payer: BC Managed Care – PPO | Admitting: Nurse Practitioner

## 2023-07-18 ENCOUNTER — Other Ambulatory Visit: Payer: Self-pay

## 2023-07-18 ENCOUNTER — Emergency Department: Payer: BC Managed Care – PPO

## 2023-07-18 ENCOUNTER — Observation Stay
Admission: EM | Admit: 2023-07-18 | Discharge: 2023-07-20 | Disposition: A | Discharge status: Home / Self Care | Payer: BC Managed Care – PPO | Attending: Internal Medicine | Admitting: Internal Medicine

## 2023-07-18 DIAGNOSIS — I10 Essential (primary) hypertension: Secondary | ICD-10-CM | POA: Diagnosis present

## 2023-07-18 DIAGNOSIS — Z1152 Encounter for screening for COVID-19: Secondary | ICD-10-CM | POA: Diagnosis not present

## 2023-07-18 DIAGNOSIS — Z841 Family history of disorders of kidney and ureter: Secondary | ICD-10-CM

## 2023-07-18 DIAGNOSIS — L03115 Cellulitis of right lower limb: Principal | ICD-10-CM

## 2023-07-18 DIAGNOSIS — A419 Sepsis, unspecified organism: Principal | ICD-10-CM

## 2023-07-18 DIAGNOSIS — A4189 Other specified sepsis: Secondary | ICD-10-CM | POA: Diagnosis not present

## 2023-07-18 DIAGNOSIS — L0390 Cellulitis, unspecified: Secondary | ICD-10-CM

## 2023-07-18 DIAGNOSIS — E1159 Type 2 diabetes mellitus with other circulatory complications: Secondary | ICD-10-CM | POA: Diagnosis present

## 2023-07-18 DIAGNOSIS — E119 Type 2 diabetes mellitus without complications: Secondary | ICD-10-CM

## 2023-07-18 DIAGNOSIS — E785 Hyperlipidemia, unspecified: Secondary | ICD-10-CM | POA: Insufficient documentation

## 2023-07-18 DIAGNOSIS — R2241 Localized swelling, mass and lump, right lower limb: Secondary | ICD-10-CM | POA: Diagnosis present

## 2023-07-18 DIAGNOSIS — Z6836 Body mass index (BMI) 36.0-36.9, adult: Secondary | ICD-10-CM

## 2023-07-18 DIAGNOSIS — Z833 Family history of diabetes mellitus: Secondary | ICD-10-CM

## 2023-07-18 DIAGNOSIS — E669 Obesity, unspecified: Secondary | ICD-10-CM | POA: Diagnosis present

## 2023-07-18 DIAGNOSIS — R739 Hyperglycemia, unspecified: Secondary | ICD-10-CM

## 2023-07-18 DIAGNOSIS — Z79899 Other long term (current) drug therapy: Secondary | ICD-10-CM

## 2023-07-18 DIAGNOSIS — F17210 Nicotine dependence, cigarettes, uncomplicated: Secondary | ICD-10-CM | POA: Diagnosis present

## 2023-07-18 DIAGNOSIS — E871 Hypo-osmolality and hyponatremia: Secondary | ICD-10-CM | POA: Diagnosis present

## 2023-07-18 DIAGNOSIS — Z794 Long term (current) use of insulin: Secondary | ICD-10-CM

## 2023-07-18 DIAGNOSIS — E1165 Type 2 diabetes mellitus with hyperglycemia: Secondary | ICD-10-CM

## 2023-07-18 LAB — COMPREHENSIVE METABOLIC PANEL
ALT: 24 U/L (ref 0–44)
AST: 19 U/L (ref 15–41)
Albumin: 3.9 g/dL (ref 3.5–5.0)
Alkaline Phosphatase: 110 U/L (ref 38–126)
Anion gap: 12 (ref 5–15)
BUN: 13 mg/dL (ref 6–20)
CO2: 23 mmol/L (ref 22–32)
Calcium: 9.4 mg/dL (ref 8.9–10.3)
Chloride: 99 mmol/L (ref 98–111)
Creatinine, Ser: 0.79 mg/dL (ref 0.44–1.00)
GFR, Estimated: >60 mL/min (ref >60)
Glucose, Bld: 207 mg/dL — ABNORMAL HIGH (ref 70–99)
Potassium: 3.8 mmol/L (ref 3.5–5.1)
Sodium: 134 mmol/L — ABNORMAL LOW (ref 135–145)
Total Bilirubin: 1.1 mg/dL (ref 0.3–1.2)
Total Protein: 8.4 g/dL — ABNORMAL HIGH (ref 6.5–8.1)

## 2023-07-18 LAB — RESP PANEL BY RT-PCR (RSV, FLU A&B, COVID)  RVPGX2
Influenza A by PCR: NEGATIVE
Influenza B by PCR: NEGATIVE
Resp Syncytial Virus by PCR: NEGATIVE
SARS Coronavirus 2 by RT PCR: NEGATIVE

## 2023-07-18 LAB — CBC
HCT: 44.6 % (ref 36.0–46.0)
Hemoglobin: 14.5 g/dL (ref 12.0–15.0)
MCH: 26.4 pg (ref 26.0–34.0)
MCHC: 32.5 g/dL (ref 30.0–36.0)
MCV: 81.2 fL (ref 80.0–100.0)
Platelets: 232 10*3/uL (ref 150–400)
RBC: 5.49 MIL/uL — ABNORMAL HIGH (ref 3.87–5.11)
RDW: 13.1 % (ref 11.5–15.5)
WBC: 15.7 10*3/uL — ABNORMAL HIGH (ref 4.0–10.5)
nRBC: 0 % (ref 0.0–0.2)

## 2023-07-18 LAB — CBG MONITORING, ED: Glucose-Capillary: 223 mg/dL — ABNORMAL HIGH (ref 70–99)

## 2023-07-18 LAB — TROPONIN I (HIGH SENSITIVITY): Troponin I (High Sensitivity): 7 ng/L (ref <18)

## 2023-07-18 MED ORDER — SODIUM CHLORIDE 0.9 % IV BOLUS
1000.0000 mL | Freq: Once | INTRAVENOUS | Status: AC
  Administered 2023-07-19: 1000 mL via INTRAVENOUS

## 2023-07-18 MED ORDER — ACETAMINOPHEN 325 MG PO TABS
650.0000 mg | ORAL_TABLET | Freq: Once | ORAL | Status: AC | PRN
  Administered 2023-07-18: 650 mg via ORAL
  Filled 2023-07-18: qty 2

## 2023-07-18 MED ORDER — SODIUM CHLORIDE 0.9 % IV SOLN
2.0000 g | Freq: Once | INTRAVENOUS | Status: AC
  Administered 2023-07-19: 2 g via INTRAVENOUS
  Filled 2023-07-18: qty 20

## 2023-07-18 NOTE — ED Provider Notes (Incomplete)
[facility] Provider Note    Event Date/Time   First MD Initiated Contact with Patient 07/18/23 2328     (approximate)   History   Hypertension and Hyperglycemia   HPI  Tammy Beasley is a 52 y.o. female who presents to the ED for evaluation of Hypertension and Hyperglycemia   I reviewed clinic visit from 1 week ago.  Obese patient with history of HTN, HLD and DM.   Physical Exam   Triage Vital Signs: ED Triage Vitals  Encounter Vitals Group     BP 07/18/23 2149 (!) 173/95     Systolic BP Percentile --      Diastolic BP Percentile --      Pulse Rate 07/18/23 2149 (!) 111     Resp 07/18/23 2149 17     Temp 07/18/23 2149 (!) 101.9 F (38.8 C)     Temp Source 07/18/23 2149 Oral     SpO2 07/18/23 2145 98 %     Weight 07/18/23 2148 215 lb (97.5 kg)     Height 07/18/23 2148 5\' 4"  (1.626 m)     Head Circumference --      Peak Flow --      Pain Score 07/18/23 2148 3     Pain Loc --      Pain Education --      Exclude from Growth Chart --     Most recent vital signs: Vitals:   07/18/23 2145 07/18/23 2149  BP:  (!) 173/95  Pulse:  (!) 111  Resp:  17  Temp:  (!) 101.9 F (38.8 C)  SpO2: 98% 96%    General: Awake, no distress. *** CV:  Good peripheral perfusion.  Resp:  Normal effort.  Abd:  No distention.  MSK:  No deformity noted.  Neuro:  No focal deficits appreciated. Other:     ED Results / Procedures / Treatments   Labs (all labs ordered are listed, but only abnormal results are displayed) Labs Reviewed  CBC - Abnormal; Notable for the following components:      Result Value   WBC 15.7 (*)    RBC 5.49 (*)    All other components within normal limits  COMPREHENSIVE METABOLIC PANEL - Abnormal; Notable for the following components:   Sodium 134 (*)    Glucose, Bld 207 (*)    Total Protein 8.4 (*)    All other components within normal limits  CBG MONITORING, ED - Abnormal; Notable for the following components:    Glucose-Capillary 223 (*)    All other components within normal limits  RESP PANEL BY RT-PCR (RSV, FLU A&B, COVID)  [id number]  URINALYSIS, ROUTINE W REFLEX MICROSCOPIC  CBG MONITORING, ED  CBG MONITORING, ED  TROPONIN I (HIGH SENSITIVITY)  TROPONIN I (HIGH SENSITIVITY)    EKG   RADIOLOGY CXR interpreted by me without evidence of acute cardiopulmonary pathology.  Official radiology report(s): DG Chest Portable 1 View  Result Date: 07/18/2023 CLINICAL DATA:  Hypertension at home. Missed medication today. Hyperglycemia. EXAM: PORTABLE CHEST 1 VIEW COMPARISON:  05/04/2019 FINDINGS: Shallow inspiration. Heart size and pulmonary vascularity are normal for technique. Lungs appear clear and expanded. No pleural effusions. No pneumothorax. Mediastinal contours appear intact. IMPRESSION: No active disease. Electronically Signed   By: Burman Nieves M.D.   On: 07/18/2023 22:19    PROCEDURES and INTERVENTIONS:  Procedures  Medications  acetaminophen (TYLENOL) tablet 650 mg (650 mg Oral Given 07/18/23 2153)     IMPRESSION /  MDM / ASSESSMENT AND PLAN / ED COURSE  I reviewed the triage vital signs and the nursing notes.  Differential diagnosis includes, but is not limited to, ***  {Patient presents with symptoms of an acute illness or injury that is potentially life-threatening.}      FINAL CLINICAL IMPRESSION(S) / ED DIAGNOSES   Final diagnoses:  None     Rx / DC Orders   ED Discharge Orders     None        Note:  This document was prepared using Dragon voice recognition software and may include unintentional dictation errors.

## 2023-07-18 NOTE — ED Triage Notes (Signed)
Pt reports HTN at home with hx of same. Pt states definitely missed her medication this morning but states it is possible she has missed others. Pt also reports hyperglycemia at home with CBG 354. Pt reports recent changes to diabetic medications as well. Pt also reports she feels fatigued and nauseous. Pt alert and oriented. Breathing unlabored speaking in full sentences.

## 2023-07-18 NOTE — ED Triage Notes (Signed)
EMS brings pt in from home for c/o HTN, not taking meds as rx

## 2023-07-18 NOTE — ED Provider Notes (Signed)
[facility] Provider Note    Event Date/Time   First MD Initiated Contact with Patient 07/18/23 2328     (approximate)   History   Hypertension and Hyperglycemia   HPI  Tammy Beasley is a 52 y.o. female who presents to the ED for evaluation of Hypertension and Hyperglycemia   I reviewed clinic visit from 1 week ago.  Obese patient with history of HTN, HLD and DM.  Patient presents for evaluation of a few hours of nausea, subjective chills in the past few hours just this evening after getting off of work.  Reports this feels similar as when she had cellulitis in the past.  No abdominal pain, emesis, stool or urinary changes.  No cough, shortness of breath, chest pain or syncope.  Physical Exam   Triage Vital Signs: ED Triage Vitals  Encounter Vitals Group     BP 07/18/23 2149 (!) 173/95     Systolic BP Percentile --      Diastolic BP Percentile --      Pulse Rate 07/18/23 2149 (!) 111     Resp 07/18/23 2149 17     Temp 07/18/23 2149 (!) 101.9 F (38.8 C)     Temp Source 07/18/23 2149 Oral     SpO2 07/18/23 2145 98 %     Weight 07/18/23 2148 215 lb (97.5 kg)     Height 07/18/23 2148 5\' 4"  (1.626 m)     Head Circumference --      Peak Flow --      Pain Score 07/18/23 2148 3     Pain Loc --      Pain Education --      Exclude from Growth Chart --     Most recent vital signs: Vitals:   07/18/23 2330 07/19/23 0200  BP: (!) 172/99 (!) 162/79  Pulse:  88  Resp: (!) 26 (!) 23  Temp:    SpO2:  92%    General: Awake, no distress.  CV:  Good peripheral perfusion.  Resp:  Normal effort.  Abd:  No distention.  MSK:  No deformity noted.  Evidence of cellulitis to the right lower extremity around the shin and ankle.  No signs of ischemia, trauma Neuro:  No focal deficits appreciated. Other:     ED Results / Procedures / Treatments   Labs (all labs ordered are listed, but only abnormal results are displayed) Labs Reviewed  CBC -  Abnormal; Notable for the following components:      Result Value   WBC 15.7 (*)    RBC 5.49 (*)    All other components within normal limits  URINALYSIS, ROUTINE W REFLEX MICROSCOPIC - Abnormal; Notable for the following components:   Color, Urine YELLOW (*)    APPearance CLEAR (*)    Specific Gravity, Urine 1.033 (*)    Glucose, UA >=500 (*)    Ketones, ur 5 (*)    Bacteria, UA RARE (*)    All other components within normal limits  COMPREHENSIVE METABOLIC PANEL - Abnormal; Notable for the following components:   Sodium 134 (*)    Glucose, Bld 207 (*)    Total Protein 8.4 (*)    All other components within normal limits  CBG MONITORING, ED - Abnormal; Notable for the following components:   Glucose-Capillary 223 (*)    All other components within normal limits  RESP PANEL BY RT-PCR (RSV, FLU A&B, COVID)  [id number]  CULTURE, BLOOD (ROUTINE X 2)  CULTURE, BLOOD (ROUTINE X 2)  PROCALCITONIN  LACTIC ACID, PLASMA  LACTIC ACID, PLASMA  CBG MONITORING, ED  CBG MONITORING, ED  TROPONIN I (HIGH SENSITIVITY)  TROPONIN I (HIGH SENSITIVITY)    EKG   RADIOLOGY CXR interpreted by me without evidence of acute cardiopulmonary pathology.  Official radiology report(s): US Venous Img Lower Unilateral Right  Result Date: 07/19/2023 CLINICAL DATA:  Right leg swelling, redness, and cellulitis. EXAM: Right LOWER EXTREMITY VENOUS DOPPLER ULTRASOUND TECHNIQUE: Gray-scale sonography with compression, as well as color and duplex ultrasound, were performed to evaluate the deep venous system(s) from the level of the common femoral vein through the popliteal and proximal calf veins. COMPARISON:  05/04/2019 FINDINGS: VENOUS Normal compressibility of the common femoral, superficial femoral, and popliteal veins, as well as the visualized calf veins. Visualized portions of profunda femoral vein and great saphenous vein unremarkable. No filling defects to suggest DVT on grayscale or color Doppler imaging.  Doppler waveforms show normal direction of venous flow, normal respiratory plasticity and response to augmentation. Limited views of the contralateral common femoral vein are unremarkable. OTHER None. Limitations: none IMPRESSION: No evidence of acute deep venous thrombosis in the visualized lower extremity veins. Electronically Signed   By: Burman Nieves M.D.   On: 07/19/2023 01:11   DG Chest Portable 1 View  Result Date: 07/18/2023 CLINICAL DATA:  Hypertension at home. Missed medication today. Hyperglycemia. EXAM: PORTABLE CHEST 1 VIEW COMPARISON:  05/04/2019 FINDINGS: Shallow inspiration. Heart size and pulmonary vascularity are normal for technique. Lungs appear clear and expanded. No pleural effusions. No pneumothorax. Mediastinal contours appear intact. IMPRESSION: No active disease. Electronically Signed   By: Burman Nieves M.D.   On: 07/18/2023 22:19    PROCEDURES and INTERVENTIONS:  .1-3 Lead EKG Interpretation  Performed by: Delton Prairie, MD Authorized by: Delton Prairie, MD     Interpretation: abnormal     ECG rate:  110   ECG rate assessment: tachycardic     Rhythm: sinus tachycardia     Ectopy: none     Conduction: normal   .Critical Care  Performed by: Delton Prairie, MD Authorized by: Delton Prairie, MD   Critical care provider statement:    Critical care time (minutes):  30   Critical care time was exclusive of:  Separately billable procedures and treating other patients   Critical care was necessary to treat or prevent imminent or life-threatening deterioration of the following conditions:  Sepsis   Critical care was time spent personally by me on the following activities:  Development of treatment plan with patient or surrogate, discussions with consultants, evaluation of patient's response to treatment, examination of patient, ordering and review of laboratory studies, ordering and review of radiographic studies, ordering and performing treatments and interventions,  pulse oximetry, re-evaluation of patient's condition and review of old charts   Medications  sodium chloride flush (NS) 0.9 % injection 10 mL (10 mLs Intravenous Given 07/19/23 0046)  acetaminophen (TYLENOL) tablet 650 mg (650 mg Oral Given 07/18/23 2153)  sodium chloride 0.9 % bolus 1,000 mL (0 mLs Intravenous Stopped 07/19/23 0134)  cefTRIAXone (ROCEPHIN) 2 g in sodium chloride 0.9 % 100 mL IVPB (0 g Intravenous Stopped 07/19/23 0047)     IMPRESSION / MDM / ASSESSMENT AND PLAN / ED COURSE  I reviewed the triage vital signs and the nursing notes.  Differential diagnosis includes, but is not limited to, UTI or pyelonephritis, gastroenteritis, cellulitis, DVT  {Patient presents with symptoms of an acute illness or injury that  is potentially life-threatening.   Pleasant diabetic patient presents with evidence of sepsis from cellulitis requiring medical admission.  Febrile, tachycardic but stable without shock.  Cellulitis to her right leg without ischemic features.  Otherwise looks well.  Leukocytosis, hyperglycemia without acidosis.  No signs of cystitis, pneumonia.  Procalcitonin is mildly elevated.  No signs of DVT on ultrasound.  Consult medicine for admission.  Sepsis protocols followed   Clinical Course as of 07/19/23 0209  [facility] Jul 19, 2023  4098 Reassessed discussed workup, admission and she is agreeable. [DS]  0108 Consult with medicine , agrees to admit [DS]    Clinical Course User Index [DS] Delton Prairie, MD     FINAL CLINICAL IMPRESSION(S) / ED DIAGNOSES   Final diagnoses:  Cellulitis of right lower extremity  Sepsis without acute organ dysfunction, due to unspecified organism (HCC)  Hyperglycemia     Rx / DC Orders   ED Discharge Orders     None        Note:  This document was prepared using Dragon voice recognition software and may include unintentional dictation errors.   Delton Prairie, MD 07/19/23 [phone]

## 2023-07-19 ENCOUNTER — Other Ambulatory Visit (HOSPITAL_COMMUNITY): Payer: Self-pay

## 2023-07-19 ENCOUNTER — Telehealth (HOSPITAL_COMMUNITY): Payer: Self-pay | Admitting: Pharmacy Technician

## 2023-07-19 DIAGNOSIS — A419 Sepsis, unspecified organism: Secondary | ICD-10-CM | POA: Diagnosis present

## 2023-07-19 DIAGNOSIS — E785 Hyperlipidemia, unspecified: Secondary | ICD-10-CM | POA: Insufficient documentation

## 2023-07-19 DIAGNOSIS — E119 Type 2 diabetes mellitus without complications: Secondary | ICD-10-CM

## 2023-07-19 LAB — URINALYSIS, ROUTINE W REFLEX MICROSCOPIC
Bilirubin Urine: NEGATIVE
Glucose, UA: >=500 mg/dL — AB
Hgb urine dipstick: NEGATIVE
Ketones, ur: 5 mg/dL — AB
Leukocytes,Ua: NEGATIVE
Nitrite: NEGATIVE
Protein, ur: NEGATIVE mg/dL
Specific Gravity, Urine: 1.033 — ABNORMAL HIGH (ref 1.005–1.030)
pH: 7 (ref 5.0–8.0)

## 2023-07-19 LAB — GLUCOSE, CAPILLARY
Glucose-Capillary: 263 mg/dL — ABNORMAL HIGH (ref 70–99)
Glucose-Capillary: 171 mg/dL — ABNORMAL HIGH (ref 70–99)
Glucose-Capillary: 219 mg/dL — ABNORMAL HIGH (ref 70–99)

## 2023-07-19 LAB — BASIC METABOLIC PANEL
Anion gap: 9 (ref 5–15)
BUN: 12 mg/dL (ref 6–20)
CO2: 23 mmol/L (ref 22–32)
Calcium: 8.9 mg/dL (ref 8.9–10.3)
Chloride: 104 mmol/L (ref 98–111)
Creatinine, Ser: 0.63 mg/dL (ref 0.44–1.00)
GFR, Estimated: >60 mL/min (ref >60)
Glucose, Bld: 180 mg/dL — ABNORMAL HIGH (ref 70–99)
Potassium: 3.6 mmol/L (ref 3.5–5.1)
Sodium: 136 mmol/L (ref 135–145)

## 2023-07-19 LAB — HIV ANTIBODY (ROUTINE TESTING W REFLEX): HIV Screen 4th Generation wRfx: NONREACTIVE

## 2023-07-19 LAB — PROCALCITONIN: Procalcitonin: 0.71 ng/mL

## 2023-07-19 LAB — TROPONIN I (HIGH SENSITIVITY): Troponin I (High Sensitivity): 5 ng/L (ref <18)

## 2023-07-19 LAB — CBC
HCT: 41.5 % (ref 36.0–46.0)
Hemoglobin: 13.4 g/dL (ref 12.0–15.0)
MCH: 26.2 pg (ref 26.0–34.0)
MCHC: 32.3 g/dL (ref 30.0–36.0)
MCV: 81.1 fL (ref 80.0–100.0)
Platelets: 220 10*3/uL (ref 150–400)
RBC: 5.12 MIL/uL — ABNORMAL HIGH (ref 3.87–5.11)
RDW: 13.1 % (ref 11.5–15.5)
WBC: 17.6 10*3/uL — ABNORMAL HIGH (ref 4.0–10.5)
nRBC: 0 % (ref 0.0–0.2)

## 2023-07-19 LAB — CBG MONITORING, ED: Glucose-Capillary: 216 mg/dL — ABNORMAL HIGH (ref 70–99)

## 2023-07-19 LAB — LACTIC ACID, PLASMA
Lactic Acid, Venous: 0.9 mmol/L (ref 0.5–1.9)
Lactic Acid, Venous: 1 mmol/L (ref 0.5–1.9)

## 2023-07-19 MED ORDER — METOPROLOL SUCCINATE ER 25 MG PO TB24
25.0000 mg | ORAL_TABLET | Freq: Every day | ORAL | Status: DC
  Administered 2023-07-19: 25 mg via ORAL
  Filled 2023-07-19: qty 1

## 2023-07-19 MED ORDER — ALBUTEROL SULFATE (2.5 MG/3ML) 0.083% IN NEBU: 2.5000 mg | INHALATION_SOLUTION | Freq: Four times a day (QID) | RESPIRATORY_TRACT | Status: DC | PRN

## 2023-07-19 MED ORDER — ROSUVASTATIN CALCIUM 10 MG PO TABS
20.0000 mg | ORAL_TABLET | Freq: Every day | ORAL | Status: DC
  Administered 2023-07-19 – 2023-07-20 (×2): 20 mg via ORAL
  Filled 2023-07-19: qty 2
  Filled 2023-07-19: qty 1
  Filled 2023-07-19: qty 2

## 2023-07-19 MED ORDER — INSULIN GLARGINE-YFGN 100 UNIT/ML ~~LOC~~ SOLN
8.0000 [IU] | Freq: Every day | SUBCUTANEOUS | Status: DC
  Administered 2023-07-19: 8 [IU] via SUBCUTANEOUS
  Filled 2023-07-19 (×3): qty 0.08

## 2023-07-19 MED ORDER — ACETAMINOPHEN 650 MG RE SUPP: 650.0000 mg | Freq: Four times a day (QID) | RECTAL | Status: DC | PRN

## 2023-07-19 MED ORDER — BENAZEPRIL HCL 20 MG PO TABS
20.0000 mg | ORAL_TABLET | Freq: Every day | ORAL | Status: DC
  Administered 2023-07-19 – 2023-07-20 (×2): 20 mg via ORAL
  Filled 2023-07-19 (×3): qty 1

## 2023-07-19 MED ORDER — MAGNESIUM HYDROXIDE 400 MG/5ML PO SUSP: 30.0000 mL | Freq: Every day | ORAL | Status: DC | PRN

## 2023-07-19 MED ORDER — TRAZODONE HCL 50 MG PO TABS: 25.0000 mg | ORAL_TABLET | Freq: Every evening | ORAL | Status: DC | PRN

## 2023-07-19 MED ORDER — ALBUTEROL SULFATE HFA 108 (90 BASE) MCG/ACT IN AERS: 2.0000 | INHALATION_SPRAY | Freq: Four times a day (QID) | RESPIRATORY_TRACT | Status: DC | PRN

## 2023-07-19 MED ORDER — ONDANSETRON HCL 4 MG/2ML IJ SOLN: 4.0000 mg | Freq: Four times a day (QID) | INTRAMUSCULAR | Status: DC | PRN

## 2023-07-19 MED ORDER — INSULIN ASPART 100 UNIT/ML IJ SOLN
0.0000 [IU] | Freq: Every day | INTRAMUSCULAR | Status: DC
  Administered 2023-07-19: 2 [IU] via SUBCUTANEOUS

## 2023-07-19 MED ORDER — SODIUM CHLORIDE 0.9 % IV SOLN
2.0000 g | INTRAVENOUS | Status: DC
  Administered 2023-07-19: 2 g via INTRAVENOUS
  Filled 2023-07-19: qty 20

## 2023-07-19 MED ORDER — ONDANSETRON HCL 4 MG PO TABS: 4.0000 mg | ORAL_TABLET | Freq: Four times a day (QID) | ORAL | Status: DC | PRN

## 2023-07-19 MED ORDER — VITAMIN D (ERGOCALCIFEROL) 1.25 MG (50000 UNIT) PO CAPS: 50000.0000 [IU] | ORAL_CAPSULE | ORAL | Status: DC

## 2023-07-19 MED ORDER — TRIAMCINOLONE ACETONIDE 0.5 % EX OINT: 1.0000 | TOPICAL_OINTMENT | Freq: Two times a day (BID) | CUTANEOUS | Status: DC | PRN

## 2023-07-19 MED ORDER — AMLODIPINE BESYLATE 5 MG PO TABS
5.0000 mg | ORAL_TABLET | Freq: Every day | ORAL | Status: DC
  Administered 2023-07-19: 5 mg via ORAL
  Filled 2023-07-19: qty 1

## 2023-07-19 MED ORDER — DAPAGLIFLOZIN PROPANEDIOL 10 MG PO TABS
10.0000 mg | ORAL_TABLET | Freq: Every day | ORAL | Status: DC
  Administered 2023-07-19 – 2023-07-20 (×2): 10 mg via ORAL
  Filled 2023-07-19 (×2): qty 1

## 2023-07-19 MED ORDER — EZETIMIBE 10 MG PO TABS
10.0000 mg | ORAL_TABLET | Freq: Every day | ORAL | Status: DC
  Administered 2023-07-19 – 2023-07-20 (×2): 10 mg via ORAL
  Filled 2023-07-19 (×2): qty 1

## 2023-07-19 MED ORDER — SODIUM CHLORIDE 0.9% FLUSH
10.0000 mL | Freq: Two times a day (BID) | INTRAVENOUS | Status: DC
  Administered 2023-07-19 – 2023-07-20 (×4): 10 mL via INTRAVENOUS

## 2023-07-19 MED ORDER — ENOXAPARIN SODIUM 60 MG/0.6ML IJ SOSY
50.0000 mg | PREFILLED_SYRINGE | INTRAMUSCULAR | Status: DC
  Administered 2023-07-19 – 2023-07-20 (×2): 50 mg via SUBCUTANEOUS
  Filled 2023-07-19 (×2): qty 0.6

## 2023-07-19 MED ORDER — ACETAMINOPHEN 325 MG PO TABS
650.0000 mg | ORAL_TABLET | Freq: Four times a day (QID) | ORAL | Status: DC | PRN
  Administered 2023-07-19: 650 mg via ORAL
  Filled 2023-07-19: qty 2

## 2023-07-19 MED ORDER — SODIUM CHLORIDE 0.9 % IV SOLN: INTRAVENOUS | Status: AC

## 2023-07-19 MED ORDER — INSULIN ASPART 100 UNIT/ML IJ SOLN
0.0000 [IU] | Freq: Three times a day (TID) | INTRAMUSCULAR | Status: DC
  Administered 2023-07-19: 5 [IU] via SUBCUTANEOUS
  Administered 2023-07-19: 3 [IU] via SUBCUTANEOUS
  Administered 2023-07-19: 8 [IU] via SUBCUTANEOUS
  Administered 2023-07-20: 2 [IU] via SUBCUTANEOUS
  Administered 2023-07-20: 3 [IU] via SUBCUTANEOUS
  Filled 2023-07-19 (×6): qty 1

## 2023-07-19 NOTE — Assessment & Plan Note (Signed)
-   The will be placed on supplement coverage with NovoLog. - We will continue basal coverage. - Continue Farxiga.

## 2023-07-19 NOTE — Telephone Encounter (Signed)
Pharmacy Patient Advocate Encounter  Received notification from [facility] that Prior Authorization for Dexcom G7 Sensor  has been APPROVED from 07/19/2023 to 07/18/2024. Ran test claim, Copay is $47.00. This test claim was processed through [facility]- copay amounts may vary at other pharmacies due to pharmacy/plan contracts, or as the patient moves through the different stages of their insurance plan.   PA #/Case ID/Reference #:  [id number]

## 2023-07-19 NOTE — ED Notes (Signed)
Pt admitted to 140 handoff complete.

## 2023-07-19 NOTE — Assessment & Plan Note (Signed)
- 

## 2023-07-19 NOTE — Plan of Care (Signed)
[facility] Tonsillectomy/Adenoidectomy, Postoperative PEDS care plan entered in error.

## 2023-07-19 NOTE — Telephone Encounter (Signed)
Pharmacy Patient Advocate Encounter   Received notification that prior authorization for Dexcom G7 Sensor is required/requested.   Insurance verification completed.   The patient is insured through [facility] .   Per test claim: PA required; PA submitted to [facility] via CoverMyMeds Key/confirmation #/EOC [id number] Status is pending

## 2023-07-19 NOTE — TOC Benefit Eligibility Note (Signed)
Patient Product/process development scientist completed.    The patient is insured through [facility]. Patient has [employer], may use a copay card, and/or apply for patient assistance if available.    Ran test claim for Novolog Flex Pen and the current 30 day co-pay is $0.00.  Ran test claim for [employer] and Requires Prior Authorization  Ran test claim for [employer] and Not Covered   This test claim was processed through [employer]- copay amounts may vary at other pharmacies due to [employer], or as the patient moves through the different stages of their insurance plan.     Roland Earl, CPHT Pharmacy Technician III Certified Patient Advocate [facility] Pharm[facility] Patient Advocate Team Direct Number: [phone]  Fax: [phone]

## 2023-07-19 NOTE — Assessment & Plan Note (Signed)
-   We will continue statin therapy. 

## 2023-07-19 NOTE — Progress Notes (Signed)
PROGRESS NOTE    Tammy Beasley  EXB:[id number] DOB: 01-Jul-1971 DOA: 07/18/2023 PCP: Sallyanne Kuster, NP    Assessment & Plan:   Principal Problem:   Sepsis due to cellulitis [facility]) Active Problems:   Essential hypertension   Type 2 diabetes mellitus without complications (HCC)   Dyslipidemia  Assessment and Plan: Sepsis: met criteria w/ leukocytosis, fever, tachycardia, tachypnea &  right lower extremity cellulitis. Continue on IV rocephin. Blood cxs are pending   RLE cellulitis: continue on IV rocephin.   HLD: continue on statin   DM2: likely poorly controlled. Continue on glargine, SSI w/ accuchecks & farxiga    HTN: continue on benazepril. D/c amlodipine as likely cause of LE edema. Start metoprolol.    Obesity: BMI 36.9. Would benefit from weight loss      DVT prophylaxis: lovenox Code Status: full  Family Communication: discussed pt's care w/ pt's family at bedside and answered their questions  Disposition Plan: possibly d/c home tomorrow   Level of care: Telemetry Medical Status is: Inpatient Remains inpatient appropriate because: severity of illness, spiked a fever today     Consultants:    Procedures:   Antimicrobials: rocephin   Subjective: Pt c/o right leg feeling hot   Objective: Vitals:   07/19/23 0408 07/19/23 0538 07/19/23 0539 07/19/23 0758  BP:   (!) 143/75   Pulse:  88 86   Resp:  (!) 23 19   Temp: (!) 100.9 F (38.3 C) 99.3 F (37.4 C)  99 F (37.2 C)  TempSrc: Oral Oral  Oral  SpO2:  96% 94%   Weight:      Height:        Intake/Output Summary (Last 24 hours) at 07/19/2023 1017 Last data filed at 07/19/2023 0047 Gross per 24 hour  Intake 100 ml  Output --  Net 100 ml   Filed Weights   07/18/23 2148  Weight: 97.5 kg    Examination:  General exam: Appears calm and comfortable  Respiratory system: Clear to auscultation. Respiratory effort normal. Cardiovascular system: S1 & S2+. No rubs, gallops or  clicks. Gastrointestinal system: Abdomen is nondistended, soft and nontender. Normal bowel sounds heard. Central nervous system: Alert and oriented. Moves all extremities  Psychiatry: Judgement and insight appear normal. Mood & affect appropriate.  Skin: RLE is erythematous and slightly edematous     Data Reviewed: I have personally reviewed following labs and imaging studies  CBC: Recent Labs  Lab 07/18/23 2157 07/19/23 0542  WBC 15.7* 17.6*  HGB 14.5 13.4  HCT 44.6 41.5  MCV 81.2 81.1  PLT 232 220   Basic Metabolic Panel: Recent Labs  Lab 07/18/23 2157 07/19/23 0542  NA 134* 136  K 3.8 3.6  CL 99 104  CO2 23 23  GLUCOSE 207* 180*  BUN 13 12  CREATININE 0.79 0.63  CALCIUM 9.4 8.9   GFR: Estimated Creatinine Clearance: 92.2 mL/min (by C-G formula based on SCr of 0.63 mg/dL). Liver Function Tests: Recent Labs  Lab 07/18/23 2157  AST 19  ALT 24  ALKPHOS 110  BILITOT 1.1  PROT 8.4*  ALBUMIN 3.9   No results for input(s): "LIPASE", "AMYLASE" in the last 168 hours. No results for input(s): "AMMONIA" in the last 168 hours. Coagulation Profile: No results for input(s): "INR", "PROTIME" in the last 168 hours. Cardiac Enzymes: No results for input(s): "CKTOTAL", "CKMB", "CKMBINDEX", "TROPONINI" in the last 168 hours. BNP (last 3 results) No results for input(s): "PROBNP" in the last 8760 hours. HbA1C:  No results for input(s): "HGBA1C" in the last 72 hours. CBG: Recent Labs  Lab 07/18/23 2148 07/19/23 0756  GLUCAP 223* 216*   Lipid Profile: No results for input(s): "CHOL", "HDL", "LDLCALC", "TRIG", "CHOLHDL", "LDLDIRECT" in the last 72 hours. Thyroid Function Tests: No results for input(s): "TSH", "T4TOTAL", "FREET4", "T3FREE", "THYROIDAB" in the last 72 hours. Anemia Panel: No results for input(s): "VITAMINB12", "FOLATE", "FERRITIN", "TIBC", "IRON", "RETICCTPCT" in the last 72 hours. Sepsis Labs: Recent Labs  Lab 07/19/23 0002 07/19/23 0935   PROCALCITON 0.71  --   LATICACIDVEN 1.0 0.9    Recent Results (from the past 240 hour(s))  Resp panel by RT-PCR (RSV, Flu A&B, Covid) Anterior Nasal Swab     Status: None   Collection Time: 07/18/23  9:57 PM   Specimen: Anterior Nasal Swab  Result Value Ref Range Status   SARS Coronavirus 2 by RT PCR NEGATIVE NEGATIVE Final    Comment: (NOTE) SARS-CoV-2 target nucleic acids are NOT DETECTED.  The SARS-CoV-2 RNA is generally detectable in upper respiratory specimens during the acute phase of infection. The lowest concentration of SARS-CoV-2 viral copies this assay can detect is 138 copies/mL. A negative result does not preclude SARS-Cov-2 infection and should not be used as the sole basis for treatment or other patient management decisions. A negative result may occur with  improper specimen collection/handling, submission of specimen other than nasopharyngeal swab, presence of viral mutation(s) within the areas targeted by this assay, and inadequate number of viral copies(<138 copies/mL). A negative result must be combined with clinical observations, patient history, and epidemiological information. The expected result is Negative.  Fact Sheet for Patients:  BloggerCourse.com  Fact Sheet for Healthcare Providers:  SeriousBroker.it  This test is no t yet approved or cleared by the [place] FDA and  has been authorized for detection and/or diagnosis of SARS-CoV-2 by FDA under an Emergency Use Authorization (EUA). This EUA will remain  in effect (meaning this test can be used) for the duration of the COVID-19 declaration under Section 564(b)(1) of the Act, 21 U.S.C.section 360bbb-3(b)(1), unless the authorization is terminated  or revoked sooner.       Influenza A by PCR NEGATIVE NEGATIVE Final   Influenza B by PCR NEGATIVE NEGATIVE Final    Comment: (NOTE) The Xpert Xpress SARS-CoV-2/FLU/RSV plus assay is intended as  an aid in the diagnosis of influenza from Nasopharyngeal swab specimens and should not be used as a sole basis for treatment. Nasal washings and aspirates are unacceptable for Xpert Xpress SARS-CoV-2/FLU/RSV testing.  Fact Sheet for Patients: BloggerCourse.com  Fact Sheet for Healthcare Providers: SeriousBroker.it  This test is not yet approved or cleared by the [place] FDA and has been authorized for detection and/or diagnosis of SARS-CoV-2 by FDA under an Emergency Use Authorization (EUA). This EUA will remain in effect (meaning this test can be used) for the duration of the COVID-19 declaration under Section 564(b)(1) of the Act, 21 U.S.C. section 360bbb-3(b)(1), unless the authorization is terminated or revoked.     Resp Syncytial Virus by PCR NEGATIVE NEGATIVE Final    Comment: (NOTE) Fact Sheet for Patients: BloggerCourse.com  Fact Sheet for Healthcare Providers: SeriousBroker.it  This test is not yet approved or cleared by the [place] FDA and has been authorized for detection and/or diagnosis of SARS-CoV-2 by FDA under an Emergency Use Authorization (EUA). This EUA will remain in effect (meaning this test can be used) for the duration of the COVID-19 declaration under Section 564(b)(1) of the  Act, 21 U.S.C. section 360bbb-3(b)(1), unless the authorization is terminated or revoked.  Performed at [facility], [address] [place], [place] [address]   Blood culture (routine x 2)     Status: None (Preliminary result)   Collection Time: 07/19/23 12:02 AM   Specimen: Left Antecubital; Blood  Result Value Ref Range Status   Specimen Description LEFT ANTECUBITAL  Final   Special Requests   Final    BOTTLES DRAWN AEROBIC AND ANAEROBIC Blood Culture adequate volume   Culture   Final    NO GROWTH < 12 HOURS Performed at [facility], [address], [place], [place] [address]    Report Status PENDING  Incomplete  Blood culture (routine x 2)     Status: None (Preliminary result)   Collection Time: 07/19/23 12:02 AM   Specimen: Right Antecubital; Blood  Result Value Ref Range Status   Specimen Description RIGHT ANTECUBITAL  Final   Special Requests   Final    BOTTLES DRAWN AEROBIC AND ANAEROBIC Blood Culture results may not be optimal due to an excessive volume of blood received in culture bottles   Culture   Final    NO GROWTH < 12 HOURS Performed at [facility], [address] [place], [place] [address]    Report Status PENDING  Incomplete         Radiology Studies: US Venous Img Lower Unilateral Right  Result Date: 07/19/2023 CLINICAL DATA:  Right leg swelling, redness, and cellulitis. EXAM: Right LOWER EXTREMITY VENOUS DOPPLER ULTRASOUND TECHNIQUE: Gray-scale sonography with compression, as well as color and duplex ultrasound, were performed to evaluate the deep venous system(s) from the level of the common femoral vein through the popliteal and proximal calf veins. COMPARISON:  05/04/2019 FINDINGS: VENOUS Normal compressibility of the common femoral, superficial femoral, and popliteal veins, as well as the visualized calf veins. Visualized portions of profunda femoral vein and great saphenous vein unremarkable. No filling defects to suggest DVT on grayscale or color Doppler imaging. Doppler waveforms show normal direction of venous flow, normal respiratory plasticity and response to augmentation. Limited views of the contralateral common femoral vein are unremarkable. OTHER None. Limitations: none IMPRESSION: No evidence of acute deep venous thrombosis in the visualized lower extremity veins. Electronically Signed   By: Burman Nieves M.D.   On: 07/19/2023 01:11   DG Chest Portable 1 View  Result Date: 07/18/2023 CLINICAL DATA:  Hypertension at home. Missed medication today. Hyperglycemia. EXAM:  PORTABLE CHEST 1 VIEW COMPARISON:  05/04/2019 FINDINGS: Shallow inspiration. Heart size and pulmonary vascularity are normal for technique. Lungs appear clear and expanded. No pleural effusions. No pneumothorax. Mediastinal contours appear intact. IMPRESSION: No active disease. Electronically Signed   By: Burman Nieves M.D.   On: 07/18/2023 22:19        Scheduled Meds:  amLODipine  5 mg Oral Daily   And   benazepril  20 mg Oral Daily   dapagliflozin propanediol  10 mg Oral QAC breakfast   enoxaparin (LOVENOX) injection  50 mg Subcutaneous Q24H   ezetimibe  10 mg Oral Daily   insulin aspart  0-15 Units Subcutaneous TID WC   insulin aspart  0-5 Units Subcutaneous QHS   insulin glargine-yfgn  8 Units Subcutaneous Q2200   rosuvastatin  20 mg Oral Daily   sodium chloride flush  10 mL Intravenous Q12H   [START ON 07/24/2023] Vitamin D (Ergocalciferol)  50,000 Units Oral Q7 days   Continuous Infusions:  sodium chloride 40  mL/hr at 07/19/23 0540   cefTRIAXone (ROCEPHIN)  IV       LOS: 0 days      Charise Killian, MD [facility] Pager [phone]  If 7PM-7AM, please contact night-coverage [email or web address] 07/19/2023, 10:17 AM

## 2023-07-19 NOTE — Inpatient Diabetes Management (Addendum)
Inpatient Diabetes Program Recommendations  AACE/ADA: New Consensus Statement on Inpatient Glycemic Control   Target Ranges:  Prepandial:   less than 140 mg/dL      Peak postprandial:   less than 180 mg/dL (1-2 hours)      Critically ill patients:  140 - 180 mg/dL    Latest Reference Range & Units 07/18/23 21:48 07/19/23 07:56  Glucose-Capillary 70 - 99 mg/dL 578 (H) 469 (H)    Latest Reference Range & Units 07/18/23 21:57 07/19/23 05:42  Glucose 70 - 99 mg/dL 629 (H) 528 (H)    Latest Reference Range & Units 11/28/22 15:47 04/10/23 15:52 07/09/23 10:52  Hemoglobin A1C 4.0 - 5.6 % 10.6 ! 9.7 ! 11.3 !   Review of Glycemic Control  Diabetes history: DM2 Outpatient Diabetes medications: Tresiba 12 units daily, Farxiga 10 mg daily, Rybelsus 6 mg daily; prescribed Trulicity 0.75 mg Qweek but not approved by insurance yet Current orders for Inpatient glycemic control: Semglee 8 units at bedtime, Novolog 0-15 units TID with meals, Novolog 0-5 units at bedtime, Farxiga 10 mg QAM  Inpatient Diabetes Program Recommendations:    HbgA1C: A1C 11.3% on 07/09/23 indicating an average glucose of 278 mg/dl over the past 2-3 months.  Outpatient DM: Patient is willing to take Novolog insulin outpatient and would prefer insulin pens. If discharged on Novolog please provide Rx for Novolog pens [phone]) and pen needles [phone]). Also, please provide Rx for Dexcom G7 sensors (#[id number]; 3 per month) and Dexcom G7 receiver [phone]).  NOTE: Spoke with patient at bedside about diabetes and home regimen for diabetes control. Patient reports being followed by PCP for diabetes management and currently taking Tresiba 12 units daily (instructions to increase by 2 units if CBG >180 mg/dl), Farxiga 10 mg daily, and Rybelsus 6 mg daily. Patients states she was taking [place] and insurance would no longer cover it (has been 1 1/2 month since last taken) so she was prescribed Trulicity 0.75 mg Qweek but it has not been  approved by insurance yet. Patient states that she checks glucose several times a day and she reports glucose is usually in the mid to upper 100's mg/dl in the morning and goes up in the 200's mg/dl during the day. Inquired about any prior use of short acting insulin and patient states that she use to take Novolog insulin in the past and she feels that she needs short acting insulin to help with higher glucose trends during the day. Discussed A1C results (11.3% on 07/09/23) and explained that current A1C indicates an average glucose of 278 mg/dl over the past 2-3 months. Discussed glucose and A1C goals. Inquired about any prior use of continuous glucose monitoring sensors and patient has never used CGM but she would be interested in using one. Discussed benefit of using CGM to help collect more data on glucose trends so her PCP can use the information to make adjustments with DM medications. Informed patient I would ask attending provider for permission to give her samples of CGM sensors.   Discussed that if glucose is going up during the day after eating, she may need to resume short acting insulin for meal coverage and/or correction. Patient verbalized understanding of information discussed and reports no further questions at this time related to diabetes. Per outpatient [facility] pharmacy, Novolog Flexpens $0 copay and Dexcom G7 sensors require prior auth. Communicated with Dr. Mayford Knife and she provided permission to run prior auth for the Dexcom G7 sensors and to allow inpatient diabetes team  to provide patient with sample of the Dexcom G7 sensors.  Addendum 07/19/23@12 :20-Spoke with patient again at bedside regarding CGM. Informed patient that her insurance covers Dexcom G7 but requires prior authorization. Patient has android phone and it is not compatible with the Dexcom G7 app. Therefore patient will need Rx for the Dexcom G7 receiver at discharge.  Educated patient on Dexcom G7 CGM regarding application and  changing CGM sensor (alternate every 10 days on back of arms), 30 minute warm-up, how to start a new sensor, and how to use receiver to check glucose.  Had patient watch YouTube video on how to apply Dexcom G7 sensor.  Patient has been given Dexcom G7 sensor samples (2).  Informed patient that it would be requested that attending provider provide Rx for first month of Dexcom G7 sensors and that she have PCP continue to provide Rx for Dexcom G7 sensors going forward. Asked patient to be sure to let PCP know about Dexcom G7 and allow provider to review reports so the provider can use the information to continue to make adjustments with DM medications if needed. Patient verbalized understanding of information and has no questions at this time.  Thanks, Orlando Penner, RN, MSN, CDE Diabetes Coordinator Inpatient Diabetes Program [phone] (Team Pager)

## 2023-07-19 NOTE — Progress Notes (Signed)
PHARMACIST - PHYSICIAN COMMUNICATION  CONCERNING:  Enoxaparin (Lovenox) for DVT Prophylaxis    RECOMMENDATION: Patient was prescribed enoxaprin 40mg  q24 hours for VTE prophylaxis.   Filed Weights   07/18/23 2148  Weight: 97.5 kg (215 lb)    Body mass index is 36.9 kg/m.  Estimated Creatinine Clearance: 92.2 mL/min (by C-G formula based on SCr of 0.79 mg/dL).   Based on [facility] policy patient is candidate for enoxaparin 0.5mg /kg TBW SQ every 24 hours based on BMI being >30.  DESCRIPTION: Pharmacy has adjusted enoxaparin dose per [facility] policy.  Patient is now receiving enoxaparin 0.5 mg/kg every 24 hours   Otelia Sergeant, PharmD, [facility] 07/19/2023 2:19 AM

## 2023-07-19 NOTE — Progress Notes (Signed)
CODE SEPSIS - PHARMACY COMMUNICATION  **Broad Spectrum Antibiotics should be administered within 1 hour of Sepsis diagnosis**  Time Code Sepsis Called/Page Received: 4132  Antibiotics Ordered: Ceftriaxone  Time of 1st antibiotic administration: 0020  Otelia Sergeant, PharmD, [facility] 07/19/2023 12:47 AM

## 2023-07-19 NOTE — H&P (Signed)
[place]   PATIENT NAME: Tammy Beasley    MR#:  [id number]  DATE OF BIRTH:  [birth year]  DATE OF ADMISSION:  07/18/2023  PRIMARY CARE PHYSICIAN: Sallyanne Kuster, NP   Patient is coming from: Home  REQUESTING/REFERRING PHYSICIAN: Delton Prairie, MD  CHIEF COMPLAINT:   Chief Complaint  Patient presents with   Hypertension   Hyperglycemia    HISTORY OF PRESENT ILLNESS:  Tammy Beasley is a 52 y.o. female with medical history significant for type diabetes mellitus and hypertension, who presented to the emergency room with acute onset of right lower leg swelling with associated erythema, warmth and tenderness since yesterday.  No recent injuries or falls or insect bites to the right leg.  She admitted to subjective fever and chills.  She has been having nausea without vomiting or diarrhea or abdominal pain.  No chest pain or palpitations.  No cough or wheezing or hemoptysis.  ED Course: When she came to the ER, temperature was 101.9 and BP 173/95 with heart rate of 111 and respiratory rate of 17 and later 26.  Labs revealed mild hyponatremia 134 and hyperglycemia of 207 with total protein 8.4 and unremarkable CMP otherwise.  High sensitive troponin I was 7 and later 5.  Procalcitonin was 0.71 and lactic acid 1.  CBC showed leukocytosis 15.7 with negative respiratory panel.  UA showed more than 500 glucose and 1033 specific gravity was otherwise unremarkable. EKG as reviewed by me : EKG showed sinus tachycardia with rate 114. Imaging: Venous Doppler to right lower extremity came back negative for DVT.Portable chest x-ray showed no acute cardiopulmonary disease.  The patient was given 650 mg p.o. Tylenol and 2 g of IV Rocephin as well as 1 L bolus of IV normal saline.  He will be admitted to a medical telemetry bed for further evaluation and management. PAST MEDICAL HISTORY:   Past Medical History:  Diagnosis Date   Diabetes mellitus without complication (HCC)     Hypertension     PAST SURGICAL HISTORY:   Past Surgical History:  Procedure Laterality Date   BREAST LUMPECTOMY     celluilitus      SOCIAL HISTORY:   Social History   Tobacco Use   Smoking status: Every Day    Types: Cigarettes   Smokeless tobacco: Never   Tobacco comments:    6 cigarettes daily  Substance Use Topics   Alcohol use: Not Currently    FAMILY HISTORY:   Family History  Problem Relation Age of Onset   Renal Disease Father    Kidney failure Father    Diabetes Father     DRUG ALLERGIES:   Allergies  Allergen Reactions   Wax [Beeswax] Itching and Swelling    REVIEW OF SYSTEMS:   ROS As per history of present illness. All pertinent systems were reviewed above. Constitutional, HEENT, cardiovascular, respiratory, GI, GU, musculoskeletal, neuro, psychiatric, endocrine, integumentary and hematologic systems were reviewed and are otherwise negative/unremarkable except for positive findings mentioned above in the HPI.   MEDICATIONS AT HOME:   Prior to Admission medications   Medication Sig Start Date End Date Taking? Authorizing Provider  amLODipine-benazepril (LOTREL) 5-20 MG capsule Take 1 capsule by mouth daily. 02/21/23  Yes Abernathy, Arlyss Repress, NP  dapagliflozin propanediol (FARXIGA) 10 MG TABS tablet Take 1 tablet (10 mg total) by mouth daily before breakfast. 02/21/23  Yes Abernathy, Alyssa, NP  ezetimibe (ZETIA) 10 MG tablet Take 10 mg by mouth daily.   Yes  [provider]  insulin degludec (TRESIBA) 100 UNIT/ML FlexTouch Pen Inject 8 units sq in am and will increase by 2 units as directed 30 units maximum 07/10/23  Yes Lyndon Code, MD  rosuvastatin (CRESTOR) 20 MG tablet Take 1 tablet (20 mg total) by mouth daily. 02/21/23  Yes Abernathy, Arlyss Repress, NP  triamcinolone ointment (KENALOG) 0.5 % Apply 1 Application topically 2 (two) times daily. 11/28/22  Yes Sallyanne Kuster, NP  Accu-Chek Softclix Lancets lancets Use as instructed to check blood  sugars twice a day  E11.65 04/10/23   Sallyanne Kuster, NP  albuterol (VENTOLIN HFA) 108 (90 Base) MCG/ACT inhaler Inhale 2 puffs into the lungs every 6 (six) hours as needed for wheezing or shortness of breath. 11/28/22   Sallyanne Kuster, NP  Blood Glucose Monitoring Suppl (ACCU-CHEK GUIDE) w/Device KIT Use as directed DXE11.65 05/11/22   Lyndon Code, MD  Dulaglutide (TRULICITY) 0.75 MG/0.5ML SOPN Take 0.75 mg q week for dm Patient not taking: Reported on 07/19/2023 07/09/23   Lyndon Code, MD  glucose blood (ACCU-CHEK GUIDE) test strip Use as directed to check blood sugars twice a day  E11.65 04/10/23   Sallyanne Kuster, NP  mupirocin ointment (BACTROBAN) 2 % Apply 1 Application topically 2 (two) times daily. To affected area until healed. Patient not taking: Reported on 07/19/2023 12/31/22   Sallyanne Kuster, NP  phenazopyridine (PYRIDIUM) 200 MG tablet Take 1 tablet (200 mg total) by mouth 3 (three) times daily as needed for pain. Patient not taking: Reported on 07/19/2023 03/01/23   Sallyanne Kuster, NP  Vitamin D, Ergocalciferol, (DRISDOL) 1.25 MG (50000 UNIT) CAPS capsule Take 1 capsule (50,000 Units total) by mouth every 7 (seven) days. 12/31/22   Sallyanne Kuster, NP      VITAL SIGNS:  Blood pressure (!) 162/79, pulse (!) 106, temperature (!) 100.9 F (38.3 C), temperature source Oral, resp. rate (!) 26, height 5\' 4"  (1.626 m), weight 97.5 kg, SpO2 96%.  PHYSICAL EXAMINATION:  Physical Exam  GENERAL:  52 y.o.-year-old female patient lying in the bed with no acute distress.  EYES: Pupils equal, round, reactive to light and accommodation. No scleral icterus. Extraocular muscles intact.  HEENT: Head atraumatic, normocephalic. Oropharynx and nasopharynx clear.  NECK:  Supple, no jugular venous distention. No thyroid enlargement, no tenderness.  LUNGS: Normal breath sounds bilaterally, no wheezing, rales,rhonchi or crepitation. No use of accessory muscles of respiration.   CARDIOVASCULAR: Regular rate and rhythm, S1, S2 normal. No murmurs, rubs, or gallops.  ABDOMEN: Soft, nondistended, nontender. Bowel sounds present. No organomegaly or mass.  EXTREMITIES: No pedal edema, cyanosis, or clubbing. Right lower leg swelling with erythema, warmth and tenderness NEUROLOGIC: Cranial nerves II through XII are intact. Muscle strength 5/5 in all extremities. Sensation intact. Gait not checked.  PSYCHIATRIC: The patient is alert and oriented x 3.  Normal affect and good eye contact. SKIN: Right lower leg swelling with erythema, warmth and tenderness.  LABORATORY PANEL:   CBC Recent Labs  Lab 07/18/23 2157  WBC 15.7*  HGB 14.5  HCT 44.6  PLT 232   ------------------------------------------------------------------------------------------------------------------  Chemistries  Recent Labs  Lab 07/18/23 2157  NA 134*  K 3.8  CL 99  CO2 23  GLUCOSE 207*  BUN 13  CREATININE 0.79  CALCIUM 9.4  AST 19  ALT 24  ALKPHOS 110  BILITOT 1.1   ------------------------------------------------------------------------------------------------------------------  Cardiac Enzymes No results for input(s): "TROPONINI" in the last 168 hours. ------------------------------------------------------------------------------------------------------------------  RADIOLOGY:  US Venous Img Lower Unilateral  Right  Result Date: 07/19/2023 CLINICAL DATA:  Right leg swelling, redness, and cellulitis. EXAM: Right LOWER EXTREMITY VENOUS DOPPLER ULTRASOUND TECHNIQUE: Gray-scale sonography with compression, as well as color and duplex ultrasound, were performed to evaluate the deep venous system(s) from the level of the common femoral vein through the popliteal and proximal calf veins. COMPARISON:  05/04/2019 FINDINGS: VENOUS Normal compressibility of the common femoral, superficial femoral, and popliteal veins, as well as the visualized calf veins. Visualized portions of profunda femoral  vein and great saphenous vein unremarkable. No filling defects to suggest DVT on grayscale or color Doppler imaging. Doppler waveforms show normal direction of venous flow, normal respiratory plasticity and response to augmentation. Limited views of the contralateral common femoral vein are unremarkable. OTHER None. Limitations: none IMPRESSION: No evidence of acute deep venous thrombosis in the visualized lower extremity veins. Electronically Signed   By: Burman Nieves M.D.   On: 07/19/2023 01:11   DG Chest Portable 1 View  Result Date: 07/18/2023 CLINICAL DATA:  Hypertension at home. Missed medication today. Hyperglycemia. EXAM: PORTABLE CHEST 1 VIEW COMPARISON:  05/04/2019 FINDINGS: Shallow inspiration. Heart size and pulmonary vascularity are normal for technique. Lungs appear clear and expanded. No pleural effusions. No pneumothorax. Mediastinal contours appear intact. IMPRESSION: No active disease. Electronically Signed   By: Burman Nieves M.D.   On: 07/18/2023 22:19      IMPRESSION AND PLAN:  Assessment and Plan: * Sepsis due to cellulitis (HCC) - This manifested by leukocytosis, fever, tachycardia and tachypnea. - Sepsis is due to right lower extremity cellulitis. - The patient will be admitted to a medical telemetry bed. - Will continue antibiotic therapy with IV Rocephin. - Warm compresses will be applied. - Pain management will be provided. - Will follow blood cultures. - We will continue hydration with IV LR.  Dyslipidemia - We will continue statin therapy.  Type 2 diabetes mellitus without complications (HCC) - The will be placed on supplement coverage with NovoLog. - We will continue basal coverage. - Continue Farxiga.  Essential hypertension - We will continue antihypertensive therapy.   DVT prophylaxis: Lovenox. Advanced Care Planning:  Code Status: full code. Family Communication:  The plan of care was discussed in details with the patient (and family). I  answered all questions. The patient agreed to proceed with the above mentioned plan. Further management will depend upon hospital course. Disposition Plan: Back to previous home environment Consults called: none. All the records are reviewed and case discussed with ED provider.  Status is: Inpatient  At the time of the admission, it appears that the appropriate admission status for this patient is inpatient.  This is judged to be reasonable and necessary in order to provide the required intensity of service to ensure the patient's safety given the presenting symptoms, physical exam findings and initial radiographic and laboratory data in the context of comorbid conditions.  The patient requires inpatient status due to high intensity of service, high risk of further deterioration and high frequency of surveillance required.  I certify that at the time of admission, it is my clinical judgment that the patient will require inpatient hospital care extending more than 2 midnights.                            Dispo: The patient is from: Home              Anticipated d/c is to: Home  Patient currently is not medically stable to d/c.              Difficult to place patient: No  Hannah Beat M.D on 07/19/2023 at 5:34 AM  [facility]   From 7 PM-7 AM, contact night-coverage [email or web address]  CC: Primary care physician; Sallyanne Kuster, NP

## 2023-07-19 NOTE — Progress Notes (Signed)
Elink monitoring for the code sepsis protocol.  

## 2023-07-19 NOTE — Assessment & Plan Note (Addendum)
-   This manifested by leukocytosis, fever, tachycardia and tachypnea. - Sepsis is due to right lower extremity cellulitis. - The patient will be admitted to a medical telemetry bed. - Will continue antibiotic therapy with IV Rocephin. - Warm compresses will be applied. - Pain management will be provided. - Will follow blood cultures. - We will continue hydration with IV LR.

## 2023-07-20 DIAGNOSIS — A419 Sepsis, unspecified organism: Secondary | ICD-10-CM | POA: Diagnosis not present

## 2023-07-20 DIAGNOSIS — L0390 Cellulitis, unspecified: Secondary | ICD-10-CM | POA: Diagnosis not present

## 2023-07-20 LAB — BASIC METABOLIC PANEL
Anion gap: 7 (ref 5–15)
BUN: 17 mg/dL (ref 6–20)
CO2: 24 mmol/L (ref 22–32)
Calcium: 8.7 mg/dL — ABNORMAL LOW (ref 8.9–10.3)
Chloride: 107 mmol/L (ref 98–111)
Creatinine, Ser: 0.7 mg/dL (ref 0.44–1.00)
GFR, Estimated: >60 mL/min (ref >60)
Glucose, Bld: 197 mg/dL — ABNORMAL HIGH (ref 70–99)
Potassium: 3.7 mmol/L (ref 3.5–5.1)
Sodium: 138 mmol/L (ref 135–145)

## 2023-07-20 LAB — GLUCOSE, CAPILLARY
Glucose-Capillary: 147 mg/dL — ABNORMAL HIGH (ref 70–99)
Glucose-Capillary: 155 mg/dL — ABNORMAL HIGH (ref 70–99)

## 2023-07-20 LAB — CBC
HCT: 39.4 % (ref 36.0–46.0)
Hemoglobin: 12.5 g/dL (ref 12.0–15.0)
MCH: 26.3 pg (ref 26.0–34.0)
MCHC: 31.7 g/dL (ref 30.0–36.0)
MCV: 82.8 fL (ref 80.0–100.0)
Platelets: 197 10*3/uL (ref 150–400)
RBC: 4.76 MIL/uL (ref 3.87–5.11)
RDW: 13.4 % (ref 11.5–15.5)
WBC: 7.6 10*3/uL (ref 4.0–10.5)
nRBC: 0 % (ref 0.0–0.2)

## 2023-07-20 MED ORDER — RYBELSUS 7 MG PO TABS: 7.0000 mg | ORAL_TABLET | Freq: Every day | ORAL | 0 refills | Status: DC

## 2023-07-20 MED ORDER — METOPROLOL SUCCINATE ER 25 MG PO TB24
12.5000 mg | ORAL_TABLET | Freq: Every day | ORAL | Status: DC
  Administered 2023-07-20: 12.5 mg via ORAL
  Filled 2023-07-20: qty 1

## 2023-07-20 MED ORDER — CEFDINIR 300 MG PO CAPS: 300.0000 mg | ORAL_CAPSULE | Freq: Two times a day (BID) | ORAL | 0 refills | Status: AC

## 2023-07-20 MED ORDER — BENAZEPRIL HCL 20 MG PO TABS: 20.0000 mg | ORAL_TABLET | Freq: Every day | ORAL | 0 refills | Status: DC

## 2023-07-20 MED ORDER — METOPROLOL SUCCINATE ER 25 MG PO TB24: 12.5000 mg | ORAL_TABLET | Freq: Every day | ORAL | 0 refills | Status: DC

## 2023-07-20 NOTE — Discharge Summary (Signed)
Physician Discharge Summary  Tammy Beasley ZOX:[id number] DOB: August 02, 1971 DOA: 07/18/2023  PCP: Sallyanne Kuster, NP  Admit date: 07/18/2023 Discharge date: 07/20/2023  Admitted From: home  Disposition:  home   Recommendations for Outpatient Follow-up:  Follow up with PCP in 1-2 weeks Get a referral from your PCP to see an endocrinologist to get better control of your DM2  Home Health: no  Equipment/Devices:  Discharge Condition: stable  CODE STATUS: full  Diet recommendation: Heart Healthy / Carb Modified  Brief/Interim Summary: HPI was taken from Dr. Arville Care: Tammy Beasley is a 52 y.o. female with medical history significant for type diabetes mellitus and hypertension, who presented to the emergency room with acute onset of right lower leg swelling with associated erythema, warmth and tenderness since yesterday.  No recent injuries or falls or insect bites to the right leg.  She admitted to subjective fever and chills.  She has been having nausea without vomiting or diarrhea or abdominal pain.  No chest pain or palpitations.  No cough or wheezing or hemoptysis.   ED Course: When she came to the ER, temperature was 101.9 and BP 173/95 with heart rate of 111 and respiratory rate of 17 and later 26.  Labs revealed mild hyponatremia 134 and hyperglycemia of 207 with total protein 8.4 and unremarkable CMP otherwise.  High sensitive troponin I was 7 and later 5.  Procalcitonin was 0.71 and lactic acid 1.  CBC showed leukocytosis 15.7 with negative respiratory panel.  UA showed more than 500 glucose and 1033 specific gravity was otherwise unremarkable. EKG as reviewed by me : EKG showed sinus tachycardia with rate 114. Imaging: Venous Doppler to right lower extremity came back negative for DVT.Portable chest x-ray showed no acute cardiopulmonary disease.   The patient was given 650 mg p.o. Tylenol and 2 g of IV Rocephin as well as 1 L bolus of IV normal saline.  He will be admitted  to a medical telemetry bed for further evaluation and management.  Discharge Diagnoses:  Principal Problem:   Sepsis due to cellulitis [facility]) Active Problems:   Essential hypertension   Type 2 diabetes mellitus without complications (HCC)   Dyslipidemia  Sepsis: met criteria w/ leukocytosis, fever, tachycardia, tachypnea &  right lower extremity cellulitis. Continue on IV rocephin while inpatient and d/c home on po cefdinir to complete the course. Sepsis resolved. Blood cxs NGTD  RLE cellulitis: continue on IV rocephin while inpatient. D/c home po cefdinir to complete the course  HLD: continue on statin   DM2: poorly controlled, HbA1c 11.3. Continue on glargine, SSI w/ accuchecks & farxiga. Received DM education   HTN: continue on benazepril. D/c amlodipine as likely cause of LE edema. Start metoprolol.    Obesity: BMI 36.9. Would benefit from weight loss    Discharge Instructions  Discharge Instructions     Diet - low sodium heart healthy   Complete by: As directed    Diet Carb Modified   Complete by: As directed    Discharge instructions   Complete by: As directed    F/u w/ PCP in 1-2 weeks. Get a referral from your PCP to see an endocrinologist to get better control of your diabetes. Pt does not want to take blood pressure medications in the ACE inhibitor family or angiotension receptor blocker family and wants to discuss this with her PCP. Pt will continue to take benazepril until she is able to see her PCP. Pt understands that benazepril is the ACE inhibitor family  Increase activity slowly   Complete by: As directed       Allergies as of 07/20/2023       Reactions   Wax [beeswax] Itching, Swelling        Medication List     STOP taking these medications    amLODipine-benazepril 5-20 MG capsule Commonly known as: Lotrel Replaced by: benazepril 20 MG tablet   phenazopyridine 200 MG tablet Commonly known as: PYRIDIUM   Trulicity 0.75 MG/0.5ML  Sopn Generic drug: Dulaglutide       TAKE these medications    Accu-Chek Guide test strip Generic drug: glucose blood Use as directed to check blood sugars twice a day  E11.65   Accu-Chek Guide w/Device Kit Use as directed DXE11.65   Accu-Chek Softclix Lancets lancets Use as instructed to check blood sugars twice a day  E11.65   albuterol 108 (90 Base) MCG/ACT inhaler Commonly known as: VENTOLIN HFA Inhale 2 puffs into the lungs every 6 (six) hours as needed for wheezing or shortness of breath.   benazepril 20 MG tablet Commonly known as: LOTENSIN Take 1 tablet (20 mg total) by mouth daily. Start taking on: July 21, 2023 Replaces: amLODipine-benazepril 5-20 MG capsule   cefdinir 300 MG capsule Commonly known as: OMNICEF Take 1 capsule (300 mg total) by mouth 2 (two) times daily for 7 days.   dapagliflozin propanediol 10 MG Tabs tablet Commonly known as: Farxiga Take 1 tablet (10 mg total) by mouth daily before breakfast.   ezetimibe 10 MG tablet Commonly known as: ZETIA Take 10 mg by mouth daily.   insulin degludec 100 UNIT/ML FlexTouch Pen Commonly known as: TRESIBA Inject 8 units sq in am and will increase by 2 units as directed 30 units maximum   metoprolol succinate 25 MG 24 hr tablet Commonly known as: TOPROL-XL Take 0.5 tablets (12.5 mg total) by mouth daily. Start taking on: July 21, 2023   mupirocin ointment 2 % Commonly known as: BACTROBAN Apply 1 Application topically 2 (two) times daily. To affected area until healed.   rosuvastatin 20 MG tablet Commonly known as: CRESTOR Take 1 tablet (20 mg total) by mouth daily.   Rybelsus 7 MG Tabs Generic drug: Semaglutide Take 1 tablet (7 mg total) by mouth daily.   triamcinolone ointment 0.5 % Commonly known as: KENALOG Apply 1 Application topically 2 (two) times daily.   Vitamin D (Ergocalciferol) 1.25 MG (50000 UNIT) Caps capsule Commonly known as: DRISDOL Take 1 capsule (50,000 Units total)  by mouth every 7 (seven) days.        Allergies  Allergen Reactions   Wax [Beeswax] Itching and Swelling    Consultations:    Procedures/Studies: US Venous Img Lower Unilateral Right  Result Date: 07/19/2023 CLINICAL DATA:  Right leg swelling, redness, and cellulitis. EXAM: Right LOWER EXTREMITY VENOUS DOPPLER ULTRASOUND TECHNIQUE: Gray-scale sonography with compression, as well as color and duplex ultrasound, were performed to evaluate the deep venous system(s) from the level of the common femoral vein through the popliteal and proximal calf veins. COMPARISON:  05/04/2019 FINDINGS: VENOUS Normal compressibility of the common femoral, superficial femoral, and popliteal veins, as well as the visualized calf veins. Visualized portions of profunda femoral vein and great saphenous vein unremarkable. No filling defects to suggest DVT on grayscale or color Doppler imaging. Doppler waveforms show normal direction of venous flow, normal respiratory plasticity and response to augmentation. Limited views of the contralateral common femoral vein are unremarkable. OTHER None. Limitations: none IMPRESSION: No evidence of  acute deep venous thrombosis in the visualized lower extremity veins. Electronically Signed   By: Burman Nieves M.D.   On: 07/19/2023 01:11   DG Chest Portable 1 View  Result Date: 07/18/2023 CLINICAL DATA:  Hypertension at home. Missed medication today. Hyperglycemia. EXAM: PORTABLE CHEST 1 VIEW COMPARISON:  05/04/2019 FINDINGS: Shallow inspiration. Heart size and pulmonary vascularity are normal for technique. Lungs appear clear and expanded. No pleural effusions. No pneumothorax. Mediastinal contours appear intact. IMPRESSION: No active disease. Electronically Signed   By: Burman Nieves M.D.   On: 07/18/2023 22:19   (Echo, Carotid, EGD, Colonoscopy, ERCP)    Subjective: Pt c/o fatigue    Discharge Exam: Vitals:   07/20/23 0102 07/20/23 0932  BP: 108/75 126/78  Pulse:  63 (!) 59  Resp: 18 17  Temp: 97.7 F (36.5 C) 97.8 F (36.6 C)  SpO2: 98% 98%   Vitals:   07/19/23 0758 07/19/23 1619 07/20/23 0102 07/20/23 0932  BP:  (!) 100/54 108/75 126/78  Pulse:  (!) 57 63 (!) 59  Resp:  18 18 17   Temp: 99 F (37.2 C) 97.9 F (36.6 C) 97.7 F (36.5 C) 97.8 F (36.6 C)  TempSrc: Oral Oral Oral   SpO2:  97% 98% 98%  Weight:      Height:        General: Pt is alert, awake, not in acute distress Cardiovascular:  S1/S2 +, no rubs, no gallops Respiratory: CTA bilaterally, no wheezing, no rhonchi Abdominal: Soft, NT, obese, bowel sounds + Extremities: warmth to palpation of RLE, no cyanosis    The results of significant diagnostics from this hospitalization (including imaging, microbiology, ancillary and laboratory) are listed below for reference.     Microbiology: Recent Results (from the past 240 hour(s))  Resp panel by RT-PCR (RSV, Flu A&B, Covid) Anterior Nasal Swab     Status: None   Collection Time: 07/18/23  9:57 PM   Specimen: Anterior Nasal Swab  Result Value Ref Range Status   SARS Coronavirus 2 by RT PCR NEGATIVE NEGATIVE Final    Comment: (NOTE) SARS-CoV-2 target nucleic acids are NOT DETECTED.  The SARS-CoV-2 RNA is generally detectable in upper respiratory specimens during the acute phase of infection. The lowest concentration of SARS-CoV-2 viral copies this assay can detect is 138 copies/mL. A negative result does not preclude SARS-Cov-2 infection and should not be used as the sole basis for treatment or other patient management decisions. A negative result may occur with  improper specimen collection/handling, submission of specimen other than nasopharyngeal swab, presence of viral mutation(s) within the areas targeted by this assay, and inadequate number of viral copies(<138 copies/mL). A negative result must be combined with clinical observations, patient history, and epidemiological information. The expected result is  Negative.  Fact Sheet for Patients:  BloggerCourse.com  Fact Sheet for Healthcare Providers:  SeriousBroker.it  This test is no t yet approved or cleared by the [place] FDA and  has been authorized for detection and/or diagnosis of SARS-CoV-2 by FDA under an Emergency Use Authorization (EUA). This EUA will remain  in effect (meaning this test can be used) for the duration of the COVID-19 declaration under Section 564(b)(1) of the Act, 21 U.S.C.section 360bbb-3(b)(1), unless the authorization is terminated  or revoked sooner.       Influenza A by PCR NEGATIVE NEGATIVE Final   Influenza B by PCR NEGATIVE NEGATIVE Final    Comment: (NOTE) The Xpert Xpress SARS-CoV-2/FLU/RSV plus assay is intended as an aid in  the diagnosis of influenza from Nasopharyngeal swab specimens and should not be used as a sole basis for treatment. Nasal washings and aspirates are unacceptable for Xpert Xpress SARS-CoV-2/FLU/RSV testing.  Fact Sheet for Patients: BloggerCourse.com  Fact Sheet for Healthcare Providers: SeriousBroker.it  This test is not yet approved or cleared by the [place] FDA and has been authorized for detection and/or diagnosis of SARS-CoV-2 by FDA under an Emergency Use Authorization (EUA). This EUA will remain in effect (meaning this test can be used) for the duration of the COVID-19 declaration under Section 564(b)(1) of the Act, 21 U.S.C. section 360bbb-3(b)(1), unless the authorization is terminated or revoked.     Resp Syncytial Virus by PCR NEGATIVE NEGATIVE Final    Comment: (NOTE) Fact Sheet for Patients: BloggerCourse.com  Fact Sheet for Healthcare Providers: SeriousBroker.it  This test is not yet approved or cleared by the [place] FDA and has been authorized for detection and/or diagnosis of  SARS-CoV-2 by FDA under an Emergency Use Authorization (EUA). This EUA will remain in effect (meaning this test can be used) for the duration of the COVID-19 declaration under Section 564(b)(1) of the Act, 21 U.S.C. section 360bbb-3(b)(1), unless the authorization is terminated or revoked.  Performed at [facility], [address] [place], [place] [address]   Blood culture (routine x 2)     Status: None (Preliminary result)   Collection Time: 07/19/23 12:02 AM   Specimen: Left Antecubital; Blood  Result Value Ref Range Status   Specimen Description LEFT ANTECUBITAL  Final   Special Requests   Final    BOTTLES DRAWN AEROBIC AND ANAEROBIC Blood Culture adequate volume   Culture   Final    NO GROWTH 1 DAY Performed at [facility], [address] [place], [place] [address]    Report Status PENDING  Incomplete  Blood culture (routine x 2)     Status: None (Preliminary result)   Collection Time: 07/19/23 12:02 AM   Specimen: Right Antecubital; Blood  Result Value Ref Range Status   Specimen Description RIGHT ANTECUBITAL  Final   Special Requests   Final    BOTTLES DRAWN AEROBIC AND ANAEROBIC Blood Culture results may not be optimal due to an excessive volume of blood received in culture bottles   Culture   Final    NO GROWTH 1 DAY Performed at [facility], [address] [place], [place] [address]    Report Status PENDING  Incomplete     Labs: BNP (last 3 results) No results for input(s): "BNP" in the last 8760 hours. Basic Metabolic Panel: Recent Labs  Lab 07/18/23 2157 07/19/23 0542 07/20/23 0441  NA 134* 136 138  K 3.8 3.6 3.7  CL 99 104 107  CO2 23 23 24   GLUCOSE 207* 180* 197*  BUN 13 12 17   CREATININE 0.79 0.63 0.70  CALCIUM 9.4 8.9 8.7*   Liver Function Tests: Recent Labs  Lab 07/18/23 2157  AST 19  ALT 24  ALKPHOS 110  BILITOT 1.1  PROT 8.4*  ALBUMIN 3.9   No results for input(s): "LIPASE", "AMYLASE" in the last  168 hours. No results for input(s): "AMMONIA" in the last 168 hours. CBC: Recent Labs  Lab 07/18/23 2157 07/19/23 0542 07/20/23 0441  WBC 15.7* 17.6* 7.6  HGB 14.5 13.4 12.5  HCT 44.6 41.5 39.4  MCV 81.2 81.1 82.8  PLT 232 220 197   Cardiac Enzymes: No results for input(s): "CKTOTAL", "CKMB", "CKMBINDEX", "TROPONINI" in the last 168  hours. BNP: Invalid input(s): "POCBNP" CBG: Recent Labs  Lab 07/19/23 1202 07/19/23 1736 07/19/23 2141 07/20/23 0831 07/20/23 1147  GLUCAP 263* 171* 219* 147* 155*   D-Dimer No results for input(s): "DDIMER" in the last 72 hours. Hgb A1c No results for input(s): "HGBA1C" in the last 72 hours. Lipid Profile No results for input(s): "CHOL", "HDL", "LDLCALC", "TRIG", "CHOLHDL", "LDLDIRECT" in the last 72 hours. Thyroid function studies No results for input(s): "TSH", "T4TOTAL", "T3FREE", "THYROIDAB" in the last 72 hours.  Invalid input(s): "FREET3" Anemia work up No results for input(s): "VITAMINB12", "FOLATE", "FERRITIN", "TIBC", "IRON", "RETICCTPCT" in the last 72 hours. Urinalysis    Component Value Date/Time   COLORURINE YELLOW (A) 07/18/2023 2357   APPEARANCEUR CLEAR (A) 07/18/2023 2357   APPEARANCEUR Clear 12/31/2022 1543   LABSPEC 1.033 (H) 07/18/2023 2357   PHURINE 7.0 07/18/2023 2357   GLUCOSEU >=500 (A) 07/18/2023 2357   HGBUR NEGATIVE 07/18/2023 2357   BILIRUBINUR NEGATIVE 07/18/2023 2357   BILIRUBINUR negative 04/10/2023 1556   BILIRUBINUR Negative 12/31/2022 1543   KETONESUR 5 (A) 07/18/2023 2357   PROTEINUR NEGATIVE 07/18/2023 2357   UROBILINOGEN 0.2 04/10/2023 1556   NITRITE NEGATIVE 07/18/2023 2357   LEUKOCYTESUR NEGATIVE 07/18/2023 2357   Sepsis Labs Recent Labs  Lab 07/18/23 2157 07/19/23 0542 07/20/23 0441  WBC 15.7* 17.6* 7.6   Microbiology Recent Results (from the past 240 hour(s))  Resp panel by RT-PCR (RSV, Flu A&B, Covid) Anterior Nasal Swab     Status: None   Collection Time: 07/18/23  9:57 PM    Specimen: Anterior Nasal Swab  Result Value Ref Range Status   SARS Coronavirus 2 by RT PCR NEGATIVE NEGATIVE Final    Comment: (NOTE) SARS-CoV-2 target nucleic acids are NOT DETECTED.  The SARS-CoV-2 RNA is generally detectable in upper respiratory specimens during the acute phase of infection. The lowest concentration of SARS-CoV-2 viral copies this assay can detect is 138 copies/mL. A negative result does not preclude SARS-Cov-2 infection and should not be used as the sole basis for treatment or other patient management decisions. A negative result may occur with  improper specimen collection/handling, submission of specimen other than nasopharyngeal swab, presence of viral mutation(s) within the areas targeted by this assay, and inadequate number of viral copies(<138 copies/mL). A negative result must be combined with clinical observations, patient history, and epidemiological information. The expected result is Negative.  Fact Sheet for Patients:  BloggerCourse.com  Fact Sheet for Healthcare Providers:  SeriousBroker.it  This test is no t yet approved or cleared by the [place] FDA and  has been authorized for detection and/or diagnosis of SARS-CoV-2 by FDA under an Emergency Use Authorization (EUA). This EUA will remain  in effect (meaning this test can be used) for the duration of the COVID-19 declaration under Section 564(b)(1) of the Act, 21 U.S.C.section 360bbb-3(b)(1), unless the authorization is terminated  or revoked sooner.       Influenza A by PCR NEGATIVE NEGATIVE Final   Influenza B by PCR NEGATIVE NEGATIVE Final    Comment: (NOTE) The Xpert Xpress SARS-CoV-2/FLU/RSV plus assay is intended as an aid in the diagnosis of influenza from Nasopharyngeal swab specimens and should not be used as a sole basis for treatment. Nasal washings and aspirates are unacceptable for Xpert Xpress  SARS-CoV-2/FLU/RSV testing.  Fact Sheet for Patients: BloggerCourse.com  Fact Sheet for Healthcare Providers: SeriousBroker.it  This test is not yet approved or cleared by the [place] FDA and has been authorized for detection and/or  diagnosis of SARS-CoV-2 by FDA under an Emergency Use Authorization (EUA). This EUA will remain in effect (meaning this test can be used) for the duration of the COVID-19 declaration under Section 564(b)(1) of the Act, 21 U.S.C. section 360bbb-3(b)(1), unless the authorization is terminated or revoked.     Resp Syncytial Virus by PCR NEGATIVE NEGATIVE Final    Comment: (NOTE) Fact Sheet for Patients: BloggerCourse.com  Fact Sheet for Healthcare Providers: SeriousBroker.it  This test is not yet approved or cleared by the [place] FDA and has been authorized for detection and/or diagnosis of SARS-CoV-2 by FDA under an Emergency Use Authorization (EUA). This EUA will remain in effect (meaning this test can be used) for the duration of the COVID-19 declaration under Section 564(b)(1) of the Act, 21 U.S.C. section 360bbb-3(b)(1), unless the authorization is terminated or revoked.  Performed at [facility], [address] [place], [place] [address]   Blood culture (routine x 2)     Status: None (Preliminary result)   Collection Time: 07/19/23 12:02 AM   Specimen: Left Antecubital; Blood  Result Value Ref Range Status   Specimen Description LEFT ANTECUBITAL  Final   Special Requests   Final    BOTTLES DRAWN AEROBIC AND ANAEROBIC Blood Culture adequate volume   Culture   Final    NO GROWTH 1 DAY Performed at [facility], [address], [place], [place] [address]    Report Status PENDING  Incomplete  Blood culture (routine x 2)     Status: None (Preliminary result)   Collection Time: 07/19/23 12:02 AM   Specimen:  Right Antecubital; Blood  Result Value Ref Range Status   Specimen Description RIGHT ANTECUBITAL  Final   Special Requests   Final    BOTTLES DRAWN AEROBIC AND ANAEROBIC Blood Culture results may not be optimal due to an excessive volume of blood received in culture bottles   Culture   Final    NO GROWTH 1 DAY Performed at [facility], [address], [place], [place] [address]    Report Status PENDING  Incomplete     Time coordinating discharge: Over 30 minutes  SIGNED:   Charise Killian, MD  [facility] 07/20/2023, 1:07 PM Pager   If 7PM-7AM, please contact night-coverage [email or web address]

## 2023-07-20 NOTE — Plan of Care (Signed)
  Problem: Education: Goal: Ability to describe self-care measures that may prevent or decrease complications (Diabetes Survival Skills Education) will improve Outcome: Progressing Goal: Individualized Educational Video(s) Outcome: Progressing   Problem: Coping: Goal: Ability to adjust to condition or change in health will improve Outcome: Progressing   Problem: Metabolic: Goal: Ability to maintain appropriate glucose levels will improve Outcome: Progressing   Problem: Nutritional: Goal: Maintenance of adequate nutrition will improve Outcome: Progressing

## 2023-07-22 ENCOUNTER — Other Ambulatory Visit (HOSPITAL_COMMUNITY): Payer: Self-pay

## 2023-07-22 ENCOUNTER — Telehealth (HOSPITAL_COMMUNITY): Payer: Self-pay | Admitting: Pharmacy Technician

## 2023-07-22 NOTE — Telephone Encounter (Signed)
Pharmacy Patient Advocate Encounter   Received notification from Fax that prior authorization for Rybelsus 7MG  tablets is required/requested.   Insurance verification completed.   The patient is insured through [facility] .   Per test claim: PA required; PA submitted to [facility] via CoverMyMeds Key/confirmation #/EOC [id number] Status is pending

## 2023-07-22 NOTE — Telephone Encounter (Signed)
Pharmacy Patient Advocate Encounter  Received notification from [facility] that Prior Authorization for Rybelsus 7MG  tablets  has been APPROVED from 07/22/2023 to 07/21/2026. Ran test claim, Copay is $47.00. This test claim was processed through [facility]- copay amounts may vary at other pharmacies due to pharmacy/plan contracts, or as the patient moves through the different stages of their insurance plan.   PA #/Case ID/Reference #: [id number]

## 2023-07-24 LAB — CULTURE, BLOOD (ROUTINE X 2)
Culture: NO GROWTH
Culture: NO GROWTH
Special Requests: ADEQUATE

## 2023-07-30 ENCOUNTER — Ambulatory Visit: Payer: BC Managed Care – PPO | Admitting: Nurse Practitioner

## 2023-07-30 ENCOUNTER — Ambulatory Visit: Payer: BC Managed Care – PPO | Admitting: Internal Medicine

## 2023-08-02 ENCOUNTER — Telehealth: Payer: Self-pay | Admitting: Internal Medicine

## 2023-08-02 NOTE — Telephone Encounter (Signed)
lvm & sent msg to bring all meds to 08/06/23 appointment per [id number]

## 2023-08-06 ENCOUNTER — Encounter: Payer: Self-pay | Admitting: Internal Medicine

## 2023-08-06 ENCOUNTER — Ambulatory Visit (INDEPENDENT_AMBULATORY_CARE_PROVIDER_SITE_OTHER): Payer: BC Managed Care – PPO | Admitting: Internal Medicine

## 2023-08-06 VITALS — BP 141/100 | HR 73 | Temp 97.8°F | Resp 16 | Ht 64.0 in | Wt 212.8 lb

## 2023-08-06 DIAGNOSIS — E782 Mixed hyperlipidemia: Secondary | ICD-10-CM

## 2023-08-06 DIAGNOSIS — R600 Localized edema: Secondary | ICD-10-CM

## 2023-08-06 DIAGNOSIS — E1165 Type 2 diabetes mellitus with hyperglycemia: Secondary | ICD-10-CM

## 2023-08-06 DIAGNOSIS — I10 Essential (primary) hypertension: Secondary | ICD-10-CM | POA: Diagnosis not present

## 2023-08-06 LAB — GLUCOSE, POCT (MANUAL RESULT ENTRY): POC Glucose: 123 mg/dL — AB (ref 70–99)

## 2023-08-06 MED ORDER — DEXCOM G7 SENSOR MISC
11 refills | Status: DC
Start: 2023-08-06 — End: 2024-07-22

## 2023-08-06 NOTE — Progress Notes (Unsigned)
[facility] [address] [place], [place] [address]  Internal MEDICINE  Office Visit Note  Patient Name: Tammy Beasley  [id number]  [id number]  Date of Service: 08/07/2023  Chief Complaint  Patient presents with   Follow-up   Diabetes    Patient has been monitoring glucose readings carefully.   Hypertension   Quality Metric Gaps    Colonoscopy needed    HPI  Diabetic follow up, pt is on 14 units of Triseba, pt is aslo on Rybelsus and Farxiga, watching her diet  She developed cellulitis of left lower ext, Norvasc was stopped ( she was on Lotrel), now on Benazepril Low dose Metoprolol was started for palpiations She continues to have BLE, slightly improved   Needs RX for Dex- com    Current Medication: Outpatient Encounter Medications as of 08/06/2023  Medication Sig   Accu-Chek Softclix Lancets lancets Use as instructed to check blood sugars twice a day  E11.65   albuterol (VENTOLIN HFA) 108 (90 Base) MCG/ACT inhaler Inhale 2 puffs into the lungs every 6 (six) hours as needed for wheezing or shortness of breath.   benazepril (LOTENSIN) 20 MG tablet Take 1 tablet (20 mg total) by mouth daily.   Blood Glucose Monitoring Suppl (ACCU-CHEK GUIDE) w/Device KIT Use as directed DXE11.65   Continuous Glucose Sensor (DEXCOM G7 SENSOR) MISC Use one every 10 days for uncontrolled dm   dapagliflozin propanediol (FARXIGA) 10 MG TABS tablet Take 1 tablet (10 mg total) by mouth daily before breakfast.   ezetimibe (ZETIA) 10 MG tablet Take 10 mg by mouth daily.   glucose blood (ACCU-CHEK GUIDE) test strip Use as directed to check blood sugars twice a day  E11.65   insulin degludec (TRESIBA) 100 UNIT/ML FlexTouch Pen Inject 8 units sq in am and will increase by 2 units as directed 30 units maximum   metoprolol succinate (TOPROL-XL) 25 MG 24 hr tablet Take 0.5 tablets (12.5 mg total) by mouth daily.   mupirocin ointment (BACTROBAN) 2 % Apply 1 Application topically 2 (two)  times daily. To affected area until healed.   rosuvastatin (CRESTOR) 10 MG tablet Take 1 tablet (10 mg total) by mouth daily.   Semaglutide (RYBELSUS) 7 MG TABS Take 1 tablet (7 mg total) by mouth daily.   triamcinolone ointment (KENALOG) 0.5 % Apply 1 Application topically 2 (two) times daily.   triamterene-hydrochlorothiazide (MAXZIDE-25) 37.5-25 MG tablet Take 1 tablet by mouth daily.   Vitamin D, Ergocalciferol, (DRISDOL) 1.25 MG (50000 UNIT) CAPS capsule Take 1 capsule (50,000 Units total) by mouth every 7 (seven) days.   [DISCONTINUED] rosuvastatin (CRESTOR) 20 MG tablet Take 1 tablet (20 mg total) by mouth daily.   [DISCONTINUED] Semaglutide (RYBELSUS) 7 MG TABS Take 1 tablet (7 mg total) by mouth daily.   No facility-administered encounter medications on file as of 08/06/2023.    Surgical History: Past Surgical History:  Procedure Laterality Date   BREAST LUMPECTOMY     celluilitus      Medical History: Past Medical History:  Diagnosis Date   Diabetes mellitus without complication (HCC)    Hypertension     Family History: Family History  Problem Relation Age of Onset   Renal Disease Father    Kidney failure Father    Diabetes Father     Social History   Socioeconomic History   Marital status: Single    Spouse name: Not on file   Number of children: Not on file   Years of education: Not  on file   Highest education level: Not on file  Occupational History   Not on file  Tobacco Use   Smoking status: Every Day    Types: Cigarettes   Smokeless tobacco: Never   Tobacco comments:    6 cigarettes daily  Substance and Sexual Activity   Alcohol use: Not Currently   Drug use: Never   Sexual activity: Not on file  Other Topics Concern   Not on file  Social History Narrative   Not on file   Social Determinants of Health   Financial Resource Strain: Not on file  Food Insecurity: Not on file  Transportation Needs: Not on file  Physical Activity: Not on file   Stress: Not on file  Social Connections: Not on file  Intimate Partner Violence: Not on file      Review of Systems  Constitutional:  Negative for fatigue and fever.  HENT:  Negative for congestion, mouth sores and postnasal drip.   Respiratory:  Negative for cough.   Cardiovascular:  Positive for leg swelling. Negative for chest pain.  Genitourinary:  Negative for flank pain.  Psychiatric/Behavioral: Negative.      Vital Signs: BP (!) 141/100   Pulse 73   Temp 97.8 F (36.6 C)   Resp 16   Ht 5\' 4"  (1.626 m)   Wt 212 lb 12.8 oz (96.5 kg)   SpO2 98%   BMI 36.53 kg/m    Physical Exam Constitutional:      Appearance: Normal appearance.  HENT:     Head: Normocephalic and atraumatic.     Nose: Nose normal.     Mouth/Throat:     Mouth: Mucous membranes are moist.     Pharynx: No posterior oropharyngeal erythema.  Eyes:     Extraocular Movements: Extraocular movements intact.     Pupils: Pupils are equal, round, and reactive to light.  Cardiovascular:     Pulses: Normal pulses.     Heart sounds: Normal heart sounds.  Pulmonary:     Effort: Pulmonary effort is normal.     Breath sounds: Normal breath sounds.  Musculoskeletal:     Right lower leg: Edema present.     Left lower leg: Edema present.  Neurological:     General: No focal deficit present.     Mental Status: She is alert.  Psychiatric:        Mood and Affect: Mood normal.        Behavior: Behavior normal.        Assessment/Plan: 1. Type 2 diabetes mellitus with hyperglycemia, without long-term current use of insulin (HCC) Continue Toujeo 14-16 units, Increase Rybelsus to 7 mg po every day and Continue Farxiga 10 mg po every day  - Continuous Glucose Sensor (DEXCOM G7 SENSOR) MISC; Use one every 10 days for uncontrolled dm  Dispense: 3 each; Refill: 11 - Semaglutide (RYBELSUS) 7 MG TABS; Take 1 tablet (7 mg total) by mouth daily.  Dispense: 90 tablet; Refill: 6 - POCT Glucose (CBG)  2. Mixed  hyperlipidemia Continue Crestor  - rosuvastatin (CRESTOR) 10 MG tablet; Take 1 tablet (10 mg total) by mouth daily.  Dispense: 90 tablet; Refill: 3  3. Essential hypertension Continue Benazepril and Metoprolol  - Basic Metabolic Panel (BMET)  4. Bilateral lower extremity edema Will add diuretic to help with edema and optimum control of her BP  - triamterene-hydrochlorothiazide (MAXZIDE-25) 37.5-25 MG tablet; Take 1 tablet by mouth daily.  Dispense: 90 tablet; Refill: 3 - Basic Metabolic Panel (  BMET)   General Counseling: Tammy Beasley understanding of the findings of todays visit and agrees with plan of treatment. I have discussed any further diagnostic evaluation that may be needed or ordered today. We also reviewed her medications today. she has been encouraged to call the office with any questions or concerns that should arise related to todays visit.    Orders Placed This Encounter  Procedures   Basic Metabolic Panel (BMET)   POCT Glucose (CBG)    Meds ordered this encounter  Medications   Continuous Glucose Sensor (DEXCOM G7 SENSOR) MISC    Sig: Use one every 10 days for uncontrolled dm    Dispense:  3 each    Refill:  11   Semaglutide (RYBELSUS) 7 MG TABS    Sig: Take 1 tablet (7 mg total) by mouth daily.    Dispense:  90 tablet    Refill:  6   triamterene-hydrochlorothiazide (MAXZIDE-25) 37.5-25 MG tablet    Sig: Take 1 tablet by mouth daily.    Dispense:  90 tablet    Refill:  3   rosuvastatin (CRESTOR) 10 MG tablet    Sig: Take 1 tablet (10 mg total) by mouth daily.    Dispense:  90 tablet    Refill:  3    Total time spent:45 Minutes Time spent includes review of chart, medications, test results, and follow up plan with the patient.   [place] Controlled Substance Database was reviewed by me.   Dr Lyndon Code Internal medicine

## 2023-08-07 MED ORDER — TRIAMTERENE-HCTZ 37.5-25 MG PO TABS
1.0000 | ORAL_TABLET | Freq: Every day | ORAL | 3 refills | Status: DC
Start: 2023-08-07 — End: 2024-07-22

## 2023-08-07 MED ORDER — ROSUVASTATIN CALCIUM 10 MG PO TABS: 10.0000 mg | ORAL_TABLET | Freq: Every day | ORAL | 3 refills | Status: DC

## 2023-08-07 MED ORDER — RYBELSUS 7 MG PO TABS
7.0000 mg | ORAL_TABLET | Freq: Every day | ORAL | 6 refills | Status: DC
Start: 2023-08-07 — End: 2023-10-10

## 2023-08-12 ENCOUNTER — Ambulatory Visit: Payer: BC Managed Care – PPO | Admitting: Internal Medicine

## 2023-08-14 ENCOUNTER — Ambulatory Visit: Payer: BC Managed Care – PPO | Admitting: Internal Medicine

## 2023-08-16 ENCOUNTER — Other Ambulatory Visit: Payer: Self-pay

## 2023-08-16 ENCOUNTER — Emergency Department
Admission: EM | Admit: 2023-08-16 | Discharge: 2023-08-16 | Disposition: A | Discharge status: Home / Self Care | Payer: BC Managed Care – PPO | Attending: Emergency Medicine | Admitting: Emergency Medicine

## 2023-08-16 ENCOUNTER — Encounter: Payer: Self-pay | Admitting: Emergency Medicine

## 2023-08-16 DIAGNOSIS — T7840XA Allergy, unspecified, initial encounter: Secondary | ICD-10-CM

## 2023-08-16 DIAGNOSIS — R21 Rash and other nonspecific skin eruption: Secondary | ICD-10-CM | POA: Diagnosis present

## 2023-08-16 DIAGNOSIS — T464X5A Adverse effect of angiotensin-converting-enzyme inhibitors, initial encounter: Secondary | ICD-10-CM | POA: Insufficient documentation

## 2023-08-16 DIAGNOSIS — T447X5A Adverse effect of beta-adrenoreceptor antagonists, initial encounter: Secondary | ICD-10-CM | POA: Diagnosis not present

## 2023-08-16 DIAGNOSIS — Z794 Long term (current) use of insulin: Secondary | ICD-10-CM | POA: Diagnosis not present

## 2023-08-16 DIAGNOSIS — T461X5A Adverse effect of calcium-channel blockers, initial encounter: Secondary | ICD-10-CM | POA: Insufficient documentation

## 2023-08-16 MED ORDER — PREDNISONE 20 MG PO TABS
60.0000 mg | ORAL_TABLET | Freq: Once | ORAL | Status: AC
  Administered 2023-08-16: 60 mg via ORAL
  Filled 2023-08-16: qty 3

## 2023-08-16 MED ORDER — PREDNISONE 20 MG PO TABS: 20.0000 mg | ORAL_TABLET | Freq: Every day | ORAL | 0 refills | Status: DC

## 2023-08-16 MED ORDER — EPINEPHRINE 0.3 MG/0.3ML IJ SOAJ: 0.3000 mg | INTRAMUSCULAR | 1 refills | Status: AC | PRN

## 2023-08-16 MED ORDER — DIPHENHYDRAMINE HCL 25 MG PO CAPS
25.0000 mg | ORAL_CAPSULE | Freq: Once | ORAL | Status: AC
  Administered 2023-08-16: 25 mg via ORAL
  Filled 2023-08-16: qty 1

## 2023-08-16 NOTE — Discharge Instructions (Addendum)
Take the zyrtec or allegra for allergies once daily. Benadryl 25-50mg  every 6 hours (but can make you sleepy so can save for nighttime)  HOLD benazapril  Epi pen for future reaction Prednisone if needed but can try to hold off given your sugars.

## 2023-08-16 NOTE — ED Triage Notes (Addendum)
Patient ambulatory to triage with steady gait, without difficulty or distress noted; pt reports last night noted generalized itching; this morning noted hives with no known cause; pt st she did have some med changes few wks ago (?lisinopril)

## 2023-08-16 NOTE — ED Provider Notes (Signed)
[facility] Provider Note    Event Date/Time   First MD Initiated Contact with Patient 08/16/23 [phone]     (approximate)   History   Allergic Reaction   HPI  Tammy Beasley is a 52 y.o. female with a history of cellulitis with recent hospital admission who comes in with concerns for allergic reaction.  Patient was admitted on 07/18/2023 for cellulitis.  She reports resolution of symptoms in regards to that.  She reports being started on benazepril and amlodipine and metoprolol at this time.  She then followed up with her PCP who discontinued the amlodipine due to lower extremity swelling and started her on hand tiamterene, hydrochlorothiazide and had her continue just lisinopril by itself.  She denies any issues until last night when she started developing itching all over and noted a rash.  She denies any bug bites, recent travel denies states, fevers, issues inside of her mouth or vagina.  Denies any difficulty breathing, GI symptoms, swelling of the face   Physical Exam   Triage Vital Signs: ED Triage Vitals  Encounter Vitals Group     BP 08/16/23 0650 (!) 152/89     Systolic BP Percentile --      Diastolic BP Percentile --      Pulse Rate 08/16/23 0650 86     Resp 08/16/23 0650 18     Temp 08/16/23 0650 97.9 F (36.6 C)     Temp Source 08/16/23 0650 Oral     SpO2 08/16/23 0650 97 %     Weight 08/16/23 0649 215 lb (97.5 kg)     Height 08/16/23 0649 5\' 4"  (1.626 m)     Head Circumference --      Peak Flow --      Pain Score 08/16/23 0649 0     Pain Loc --      Pain Education --      Exclude from Growth Chart --     Most recent vital signs: Vitals:   08/16/23 0650  BP: (!) 152/89  Pulse: 86  Resp: 18  Temp: 97.9 F (36.6 C)  SpO2: 97%     General: Awake, no distress.  CV:  Good peripheral perfusion.  Resp:  Normal effort.  Abd:  No distention.  Other:  Diffuse urticarial rash-no lesions on hands or feet's.  No oral  lesions.   ED Results / Procedures / Treatments   Labs (all labs ordered are listed, but only abnormal results are displayed) Labs Reviewed - No data to display   PROCEDURES:  Critical Care performed: No  Procedures   MEDICATIONS ORDERED IN ED: Medications  diphenhydrAMINE (BENADRYL) capsule 25 mg (25 mg Oral Given 08/16/23 0834)  predniSONE (DELTASONE) tablet 60 mg (60 mg Oral Given 08/16/23 0834)     IMPRESSION / MDM / ASSESSMENT AND PLAN / ED COURSE  I reviewed the triage vital signs and the nursing notes.   Patient's presentation is most consistent with acute, uncomplicated illness.   Patient comes in with concern for allergic reaction.  Exam consistent with hives.  Unclear what was the cause of this.  She denies any new detergents.  She has multiple new medications including metoprolol, lisinopril, tiametere (I see that she started the hydrochlorothiazide previously).  We discussed starting off with holding lisinopril given this is the highest chance for reaction and can cause worsening reaction such as angioedema. We discussed pro and con of steroids and she wanted to give the steroids a  try.  She reports that normally her sugars run in the 200s but she is on a long-acting insulin in the morning of 18 units.  We discussed going up on this given she has not taken her dose today to 20 units and tomorrow reevaluate her sugars and if they are staying consistently more elevated but she could go up a few units as well.  We discussed that she does not need to come to the ER for sugars in the 300s to 400s only if she is developing any symptoms.  We also discussed that if her rash is resolving or improving tomorrow that we she could even hold off on the steroids tomorrow given the complication with her sugars.  She does have a follow-up appointment with her primary care doctor in a few days.  I will hold off on lisinopril as an allergy given we are unclear if this is actually what she is  allergic to and she will discuss further with her primary care doctor we did discuss EpiPen for future reactions given unclear what she is reacting to.  She expressed understanding of felt comfortable with discharge   FINAL CLINICAL IMPRESSION(S) / ED DIAGNOSES   Final diagnoses:  Allergic reaction, initial encounter     Rx / DC Orders   ED Discharge Orders     None        Note:  This document was prepared using Dragon voice recognition software and may include unintentional dictation errors.   Concha Se, MD 08/16/23 [phone]

## 2023-08-19 ENCOUNTER — Encounter: Payer: Self-pay | Admitting: Internal Medicine

## 2023-08-19 ENCOUNTER — Ambulatory Visit (INDEPENDENT_AMBULATORY_CARE_PROVIDER_SITE_OTHER): Payer: BC Managed Care – PPO | Admitting: Internal Medicine

## 2023-08-19 VITALS — BP 135/85 | HR 77 | Temp 97.8°F | Resp 16 | Ht 64.0 in | Wt 210.0 lb

## 2023-08-19 DIAGNOSIS — E1165 Type 2 diabetes mellitus with hyperglycemia: Secondary | ICD-10-CM

## 2023-08-19 DIAGNOSIS — R079 Chest pain, unspecified: Secondary | ICD-10-CM | POA: Diagnosis not present

## 2023-08-19 DIAGNOSIS — Z794 Long term (current) use of insulin: Secondary | ICD-10-CM

## 2023-08-19 DIAGNOSIS — I10 Essential (primary) hypertension: Secondary | ICD-10-CM | POA: Diagnosis not present

## 2023-08-19 DIAGNOSIS — R002 Palpitations: Secondary | ICD-10-CM

## 2023-08-19 MED ORDER — AMLODIPINE BESYLATE 5 MG PO TABS: ORAL_TABLET | ORAL | 1 refills | Status: DC

## 2023-08-19 MED ORDER — DILTIAZEM HCL ER 120 MG PO TB24: ORAL_TABLET | ORAL | 3 refills | Status: DC

## 2023-08-19 NOTE — Progress Notes (Signed)
[facility] [address] [place], [place] [address]  Internal MEDICINE  Office Visit Note  Patient Name: Tammy Beasley  [id number]  [id number]  Date of Service: 08/19/2023  Chief Complaint  Patient presents with   Follow-up   Diabetes   Hypertension   Medication Reaction    Patient stopped taking Metoprolol, Benazepril and Triam/hydrochlorothiazide due to possible allergy.   Quality Metric Gaps    Colonoscopy    HPI  Pt is seen today for f/u Went to ED for hives and facial edema, patient was instructed to stop her blood pressure medications, she was restarted by the hospital on metoprolol and on benazepril Her blood pressure is mildly elevated Continues to take her diabetic medications Patient is nonadherent to therapy and also to her diabetic diet She continues to be a smoker as well   Current Medication: Outpatient Encounter Medications as of 08/19/2023  Medication Sig   Accu-Chek Softclix Lancets lancets Use as instructed to check blood sugars twice a day  E11.65   albuterol (VENTOLIN HFA) 108 (90 Base) MCG/ACT inhaler Inhale 2 puffs into the lungs every 6 (six) hours as needed for wheezing or shortness of breath.   amLODipine (NORVASC) 5 MG tablet Take one tab po at bedtime for blood pressure   Blood Glucose Monitoring Suppl (ACCU-CHEK GUIDE) w/Device KIT Use as directed DXE11.65   Continuous Glucose Sensor (DEXCOM G7 SENSOR) MISC Use one every 10 days for uncontrolled dm   dapagliflozin propanediol (FARXIGA) 10 MG TABS tablet Take 1 tablet (10 mg total) by mouth daily before breakfast.   ezetimibe (ZETIA) 10 MG tablet Take 10 mg by mouth daily.   glucose blood (ACCU-CHEK GUIDE) test strip Use as directed to check blood sugars twice a day  E11.65   insulin degludec (TRESIBA) 100 UNIT/ML FlexTouch Pen Inject 8 units sq in am and will increase by 2 units as directed 30 units maximum   mupirocin ointment (BACTROBAN) 2 % Apply 1 Application topically 2  (two) times daily. To affected area until healed.   rosuvastatin (CRESTOR) 10 MG tablet Take 1 tablet (10 mg total) by mouth daily.   Semaglutide (RYBELSUS) 7 MG TABS Take 1 tablet (7 mg total) by mouth daily.   triamcinolone ointment (KENALOG) 0.5 % Apply 1 Application topically 2 (two) times daily.   triamterene-hydrochlorothiazide (MAXZIDE-25) 37.5-25 MG tablet Take 1 tablet by mouth daily.   Vitamin D, Ergocalciferol, (DRISDOL) 1.25 MG (50000 UNIT) CAPS capsule Take 1 capsule (50,000 Units total) by mouth every 7 (seven) days.   [DISCONTINUED] diltiazem (CARDIZEM LA) 120 MG 24 hr tablet Take one tab po at bedtime for palpitation   [DISCONTINUED] metoprolol succinate (TOPROL-XL) 25 MG 24 hr tablet Take 0.5 tablets (12.5 mg total) by mouth daily.   [DISCONTINUED] predniSONE (DELTASONE) 20 MG tablet Take 1 tablet (20 mg total) by mouth daily with breakfast for 5 days.   No facility-administered encounter medications on file as of 08/19/2023.    Surgical History: Past Surgical History:  Procedure Laterality Date   BREAST LUMPECTOMY     celluilitus      Medical History: Past Medical History:  Diagnosis Date   Diabetes mellitus without complication (HCC)    Hypertension     Family History: Family History  Problem Relation Age of Onset   Renal Disease Father    Kidney failure Father    Diabetes Father     Social History   Socioeconomic History   Marital status: Single  Spouse name: Not on file   Number of children: Not on file   Years of education: Not on file   Highest education level: Not on file  Occupational History   Not on file  Tobacco Use   Smoking status: Every Day    Types: Cigarettes   Smokeless tobacco: Never   Tobacco comments:    6 cigarettes daily  Substance and Sexual Activity   Alcohol use: Not Currently   Drug use: Never   Sexual activity: Not on file  Other Topics Concern   Not on file  Social History Narrative   Not on file   Social  Determinants of Health   Financial Resource Strain: Not on file  Food Insecurity: Not on file  Transportation Needs: Not on file  Physical Activity: Not on file  Stress: Not on file  Social Connections: Not on file  Intimate Partner Violence: Not on file      Review of Systems  Constitutional:  Negative for fatigue and fever.  HENT:  Negative for congestion, mouth sores and postnasal drip.   Respiratory:  Negative for cough.   Cardiovascular:  Negative for chest pain.  Genitourinary:  Negative for flank pain.  Psychiatric/Behavioral: Negative.      Vital Signs: BP 135/85   Pulse 77   Temp 97.8 F (36.6 C)   Resp 16   Ht 5\' 4"  (1.626 m)   Wt 210 lb (95.3 kg)   SpO2 97%   BMI 36.05 kg/m    Physical Exam Constitutional:      Appearance: Normal appearance.  HENT:     Head: Normocephalic and atraumatic.     Nose: Nose normal.     Mouth/Throat:     Mouth: Mucous membranes are moist.     Pharynx: No posterior oropharyngeal erythema.  Eyes:     Extraocular Movements: Extraocular movements intact.     Pupils: Pupils are equal, round, and reactive to light.  Cardiovascular:     Pulses: Normal pulses.     Heart sounds: Normal heart sounds.     Comments: Patient does have a heart rate of 58 confirmed by EKG Pulmonary:     Effort: Pulmonary effort is normal.     Breath sounds: Normal breath sounds.  Neurological:     General: No focal deficit present.     Mental Status: She is alert.  Psychiatric:        Mood and Affect: Mood normal.        Behavior: Behavior normal.        Assessment/Plan: 1. Palpitation On EKG patient has a heart rate of 58, which probably get worse by use of her beta-blocker will DC metoprolol - EKG 12-Lead  2. Type 2 diabetes mellitus with hyperglycemia, with long-term current use of insulin (HCC) Continue on Rybelsus Farxiga and her long-acting insulin for now encouraged to adherence to her diabetic diet  3. Essential  hypertension Continue on hydrochlorothiazide we will add low-dose amlodipine - amLODipine (NORVASC) 5 MG tablet; Take one tab po at bedtime for blood pressure  Dispense: 90 tablet; Refill: 1   General Counseling: Marshal verbalizes understanding of the findings of todays visit and agrees with plan of treatment. I have discussed any further diagnostic evaluation that may be needed or ordered today. We also reviewed her medications today. she has been encouraged to call the office with any questions or concerns that should arise related to todays visit.    Orders Placed This Encounter  Procedures  EKG 12-Lead    Meds ordered this encounter  Medications   DISCONTD: diltiazem (CARDIZEM LA) 120 MG 24 hr tablet    Sig: Take one tab po at bedtime for palpitation    Dispense:  30 tablet    Refill:  3   amLODipine (NORVASC) 5 MG tablet    Sig: Take one tab po at bedtime for blood pressure    Dispense:  90 tablet    Refill:  1    Total time spent:30 Minutes Time spent includes review of chart, medications, test results, and follow up plan with the patient.   [place] Controlled Substance Database was reviewed by me.   Dr Lyndon Code Internal medicine

## 2023-08-26 ENCOUNTER — Other Ambulatory Visit: Payer: Self-pay | Admitting: Internal Medicine

## 2023-09-14 ENCOUNTER — Other Ambulatory Visit: Payer: Self-pay

## 2023-09-14 ENCOUNTER — Emergency Department
Admission: EM | Admit: 2023-09-14 | Discharge: 2023-09-15 | Disposition: A | Discharge status: Home / Self Care | Payer: BC Managed Care – PPO | Attending: Emergency Medicine | Admitting: Emergency Medicine

## 2023-09-14 ENCOUNTER — Encounter: Payer: Self-pay | Admitting: Emergency Medicine

## 2023-09-14 DIAGNOSIS — R197 Diarrhea, unspecified: Secondary | ICD-10-CM | POA: Diagnosis not present

## 2023-09-14 DIAGNOSIS — R1084 Generalized abdominal pain: Secondary | ICD-10-CM | POA: Diagnosis not present

## 2023-09-14 DIAGNOSIS — E119 Type 2 diabetes mellitus without complications: Secondary | ICD-10-CM | POA: Insufficient documentation

## 2023-09-14 DIAGNOSIS — R112 Nausea with vomiting, unspecified: Secondary | ICD-10-CM | POA: Diagnosis present

## 2023-09-14 LAB — CBC
HCT: 44.3 % (ref 36.0–46.0)
Hemoglobin: 14.1 g/dL (ref 12.0–15.0)
MCH: 26.5 pg (ref 26.0–34.0)
MCHC: 31.8 g/dL (ref 30.0–36.0)
MCV: 83.1 fL (ref 80.0–100.0)
Platelets: 226 10*3/uL (ref 150–400)
RBC: 5.33 MIL/uL — ABNORMAL HIGH (ref 3.87–5.11)
RDW: 13.2 % (ref 11.5–15.5)
WBC: 7.4 10*3/uL (ref 4.0–10.5)
nRBC: 0 % (ref 0.0–0.2)

## 2023-09-14 LAB — COMPREHENSIVE METABOLIC PANEL
ALT: 38 U/L (ref 0–44)
AST: 30 U/L (ref 15–41)
Albumin: 3.5 g/dL (ref 3.5–5.0)
Alkaline Phosphatase: 100 U/L (ref 38–126)
Anion gap: 7 (ref 5–15)
BUN: 12 mg/dL (ref 6–20)
CO2: 23 mmol/L (ref 22–32)
Calcium: 8.4 mg/dL — ABNORMAL LOW (ref 8.9–10.3)
Chloride: 104 mmol/L (ref 98–111)
Creatinine, Ser: 0.65 mg/dL (ref 0.44–1.00)
GFR, Estimated: >60 mL/min (ref >60)
Glucose, Bld: 220 mg/dL — ABNORMAL HIGH (ref 70–99)
Potassium: 3.3 mmol/L — ABNORMAL LOW (ref 3.5–5.1)
Sodium: 134 mmol/L — ABNORMAL LOW (ref 135–145)
Total Bilirubin: 0.8 mg/dL (ref <1.2)
Total Protein: 8.1 g/dL (ref 6.5–8.1)

## 2023-09-14 LAB — LIPASE, BLOOD: Lipase: 19 U/L (ref 11–51)

## 2023-09-14 LAB — CBG MONITORING, ED: Glucose-Capillary: 230 mg/dL — ABNORMAL HIGH (ref 70–99)

## 2023-09-14 MED ORDER — ONDANSETRON 4 MG PO TBDP
4.0000 mg | ORAL_TABLET | Freq: Once | ORAL | Status: AC
  Administered 2023-09-15: 4 mg via ORAL
  Filled 2023-09-14: qty 1

## 2023-09-14 NOTE — ED Provider Notes (Signed)
[facility] Provider Note    Event Date/Time   First MD Initiated Contact with Patient 09/14/23 2314     (approximate)   History   Abdominal Pain, Vomiting, and Diarrhea   HPI Tammy Beasley is a 52 y.o. female with history of DM2 presenting today for vomiting and diarrhea.  Patient states she had generalized abdominal ache yesterday which was followed by nausea and vomiting.  She took a Zofran today and the nausea stopped and has been having diarrhea since then.  Currently denies any abdominal pain symptoms.  Denying chest pain, shortness of breath, bloody stools, dysuria, hematuria.  No prior abdominal surgeries.  Has been able to tolerate p.o. since then.  Reports that she did have [place] food last night which she believes upset her stomach.     Physical Exam   Triage Vital Signs: ED Triage Vitals  Encounter Vitals Group     BP 09/14/23 2120 (!) 156/86     Systolic BP Percentile --      Diastolic BP Percentile --      Pulse Rate 09/14/23 2120 95     Resp 09/14/23 2120 18     Temp 09/14/23 2120 98.9 F (37.2 C)     Temp Source 09/14/23 2120 Oral     SpO2 09/14/23 2120 96 %     Weight 09/14/23 2120 210 lb (95.3 kg)     Height 09/14/23 2120 5\' 4"  (1.626 m)     Head Circumference --      Peak Flow --      Pain Score 09/14/23 2128 1     Pain Loc --      Pain Education --      Exclude from Growth Chart --     Most recent vital signs: Vitals:   09/14/23 2120 09/14/23 2238  BP: (!) 156/86 (!) 147/81  Pulse: 95 93  Resp: 18 16  Temp: 98.9 F (37.2 C) 98.9 F (37.2 C)  SpO2: 96% 96%   Physical Exam: I have reviewed the vital signs and nursing notes. General: Awake, alert, no acute distress.  Nontoxic appearing. Head:  Atraumatic, normocephalic.   ENT:  EOM intact, PERRL. Oral mucosa is pink and moist with no lesions. Neck: Neck is supple with full range of motion, No meningeal signs. Cardiovascular:  RRR, No murmurs. Peripheral  pulses palpable and equal bilaterally. Respiratory:  Symmetrical chest wall expansion.  No rhonchi, rales, or wheezes.  Good air movement throughout.  No use of accessory muscles.   Musculoskeletal:  No cyanosis or edema. Moving extremities with full ROM Abdomen:  Soft, nontender, nondistended. Neuro:  GCS 15, moving all four extremities, interacting appropriately. Speech clear. Psych:  Calm, appropriate.   Skin:  Warm, dry, no rash.    ED Results / Procedures / Treatments   Labs (all labs ordered are listed, but only abnormal results are displayed) Labs Reviewed  COMPREHENSIVE METABOLIC PANEL - Abnormal; Notable for the following components:      Result Value   Sodium 134 (*)    Potassium 3.3 (*)    Glucose, Bld 220 (*)    Calcium 8.4 (*)    All other components within normal limits  CBC - Abnormal; Notable for the following components:   RBC 5.33 (*)    All other components within normal limits  CBG MONITORING, ED - Abnormal; Notable for the following components:   Glucose-Capillary 230 (*)    All other components within normal limits  LIPASE, BLOOD     EKG    RADIOLOGY    PROCEDURES:  Critical Care performed: No  Procedures   MEDICATIONS ORDERED IN ED: Medications  ondansetron (ZOFRAN-ODT) disintegrating tablet 4 mg (has no administration in time range)     IMPRESSION / MDM / ASSESSMENT AND PLAN / ED COURSE  I reviewed the triage vital signs and the nursing notes.                              Differential diagnosis includes, but is not limited to, viral GI infection, enteritis, food poisoning, lower suspicion for acute intra-abdominal infections like appendicitis, diverticulitis, UTI  Patient's presentation is most consistent with acute complicated illness / injury requiring diagnostic workup.  Patient is a 52 year old female presenting today for nausea, vomiting, and diarrhea.  On arrival vital signs are stable.  Does not have any abdominal tenderness  anywhere.  Currently her only symptom is diarrhea.  She states she does feel significantly better than she felt this morning and last night.  Has been able to tolerate p.o.  Laboratory workup largely reassuring with no leukocytosis and normal labs regarding her liver, pancreas, and gallbladder.  Her symptoms seem most consistent with either a viral gastroenteritis or a mild case of food poisoning.  We discussed CT imaging, but patient has no abdominal tenderness at all right now with stable vital signs and reassuring lab workup.  Patient felt comfortable with foregoing CT imaging I do not think is warranted at this time.  Patient had also not provided a UA yet but she did not want to wait for these results as she stated she did not have any urinary pain and did not feel is warranted at this time.  Patient will get 1 dose of Zofran here and will be discharged with Zofran going home.  Patient was agreeable with this plan and given strict return precautions should she have any new abdominal or worsening pain.  Told to follow-up with PCP as needed.     FINAL CLINICAL IMPRESSION(S) / ED DIAGNOSES   Final diagnoses:  Nausea vomiting and diarrhea     Rx / DC Orders   ED Discharge Orders          Ordered    ondansetron (ZOFRAN) 4 MG tablet  Every 8 hours PRN        09/15/23 0001             Note:  This document was prepared using Dragon voice recognition software and may include unintentional dictation errors.   Janith Lima, MD 09/15/23 0001

## 2023-09-14 NOTE — ED Triage Notes (Signed)
Pt to ED from home c/o lower abd pain and n/v/d since last night, as well as fevers.  States vomiting every 30 minutes and diarrhea starting this morning 10-15 episodes.  Denies urinary changes.  States ate [place] food last night.  Pt A&Ox4, chest rise even and unlabored, skin WNL and in NAD at this time.

## 2023-09-15 MED ORDER — ONDANSETRON HCL 4 MG PO TABS: 4.0000 mg | ORAL_TABLET | Freq: Three times a day (TID) | ORAL | 0 refills | Status: DC | PRN

## 2023-09-16 ENCOUNTER — Ambulatory Visit (INDEPENDENT_AMBULATORY_CARE_PROVIDER_SITE_OTHER): Payer: BC Managed Care – PPO | Admitting: Nurse Practitioner

## 2023-09-16 ENCOUNTER — Encounter: Payer: Self-pay | Admitting: Nurse Practitioner

## 2023-09-16 VITALS — BP 130/78 | HR 105 | Temp 97.9°F | Resp 16 | Ht 64.0 in | Wt 210.4 lb

## 2023-09-16 DIAGNOSIS — E1169 Type 2 diabetes mellitus with other specified complication: Secondary | ICD-10-CM

## 2023-09-16 DIAGNOSIS — I10 Essential (primary) hypertension: Secondary | ICD-10-CM | POA: Diagnosis not present

## 2023-09-16 DIAGNOSIS — R300 Dysuria: Secondary | ICD-10-CM

## 2023-09-16 DIAGNOSIS — E782 Mixed hyperlipidemia: Secondary | ICD-10-CM | POA: Diagnosis not present

## 2023-09-16 DIAGNOSIS — Z1211 Encounter for screening for malignant neoplasm of colon: Secondary | ICD-10-CM

## 2023-09-16 DIAGNOSIS — Z794 Long term (current) use of insulin: Secondary | ICD-10-CM

## 2023-09-16 DIAGNOSIS — Z1212 Encounter for screening for malignant neoplasm of rectum: Secondary | ICD-10-CM

## 2023-09-16 DIAGNOSIS — Z1231 Encounter for screening mammogram for malignant neoplasm of breast: Secondary | ICD-10-CM

## 2023-09-16 MED ORDER — TRESIBA FLEXTOUCH 100 UNIT/ML ~~LOC~~ SOPN: 40.0000 [IU] | PEN_INJECTOR | Freq: Every day | SUBCUTANEOUS | 5 refills | Status: DC

## 2023-09-16 MED ORDER — TIRZEPATIDE 5 MG/0.5ML ~~LOC~~ SOAJ
5.0000 mg | SUBCUTANEOUS | 5 refills | Status: DC
Start: 2023-09-16 — End: 2024-01-06

## 2023-09-16 NOTE — Progress Notes (Signed)
[facility] [address] [place], [place] [address]  Internal MEDICINE  Office Visit Note  Patient Name: Tammy Beasley  [id number]  [id number]  Date of Service: 09/16/2023  Chief Complaint  Patient presents with   Diabetes   Hypertension   Follow-up   Quality Metric Gaps    Needs mammogram and colonoscopy     HPI Tammy Beasley presents for a follow-up visit for diabetes, possible UTI and hyperlipidemia.  Diabetes -- taking tresiba 30 units daily in the morning. Fasting glucose in the morning has been consistently above 200. Also taking farxiga and rybelsus. Using dexcom sensor.  Possible UTI -- patient unable to urinate currently even though she drank some water. Reports urinary discomfort, urgency, frequency, and burning with urination.  Hyperlipidemia -- taking rosuvastatin and ezetimibe Due for CRC screening -- send cologuard Due for screening mammogram     Current Medication: Outpatient Encounter Medications as of 09/16/2023  Medication Sig   Accu-Chek Softclix Lancets lancets Use as instructed to check blood sugars twice a day  E11.65   albuterol (VENTOLIN HFA) 108 (90 Base) MCG/ACT inhaler Inhale 2 puffs into the lungs every 6 (six) hours as needed for wheezing or shortness of breath.   amLODipine (NORVASC) 5 MG tablet Take one tab po at bedtime for blood pressure   Blood Glucose Monitoring Suppl (ACCU-CHEK GUIDE) w/Device KIT Use as directed DXE11.65   Continuous Glucose Sensor (DEXCOM G7 SENSOR) MISC Use one every 10 days for uncontrolled dm   dapagliflozin propanediol (FARXIGA) 10 MG TABS tablet Take 1 tablet (10 mg total) by mouth daily before breakfast.   ezetimibe (ZETIA) 10 MG tablet Take 10 mg by mouth daily.   glucose blood (ACCU-CHEK GUIDE) test strip Use as directed to check blood sugars twice a day  E11.65   mupirocin ointment (BACTROBAN) 2 % Apply 1 Application topically 2 (two) times daily. To affected area until healed.   ondansetron (ZOFRAN)  4 MG tablet Take 1 tablet (4 mg total) by mouth every 8 (eight) hours as needed for nausea or vomiting.   rosuvastatin (CRESTOR) 10 MG tablet Take 1 tablet (10 mg total) by mouth daily.   Semaglutide (RYBELSUS) 7 MG TABS Take 1 tablet (7 mg total) by mouth daily.   tirzepatide [facility]) 5 MG/0.5ML Pen Inject 5 mg into the skin once a week.   triamcinolone ointment (KENALOG) 0.5 % Apply 1 Application topically 2 (two) times daily.   triamterene-hydrochlorothiazide (MAXZIDE-25) 37.5-25 MG tablet Take 1 tablet by mouth daily.   Vitamin D, Ergocalciferol, (DRISDOL) 1.25 MG (50000 UNIT) CAPS capsule Take 1 capsule (50,000 Units total) by mouth every 7 (seven) days.   [DISCONTINUED] TRESIBA FLEXTOUCH 100 UNIT/ML FlexTouch Pen INJECT 8 UNITS UNDER THE SKIN IN THE MORNING AND WILL INCREASE BY 2 UNITS AS DIRECTED 30 UNITS MAXIMUM   TRESIBA FLEXTOUCH 100 UNIT/ML FlexTouch Pen Inject 40 Units into the skin daily.   No facility-administered encounter medications on file as of 09/16/2023.    Surgical History: Past Surgical History:  Procedure Laterality Date   BREAST LUMPECTOMY     celluilitus      Medical History: Past Medical History:  Diagnosis Date   Diabetes mellitus without complication (HCC)    Hypertension     Family History: Family History  Problem Relation Age of Onset   Renal Disease Father    Kidney failure Father    Diabetes Father     Social History   Socioeconomic History   Marital status: Single  Spouse name: Not on file   Number of children: Not on file   Years of education: Not on file   Highest education level: Not on file  Occupational History   Not on file  Tobacco Use   Smoking status: Every Day    Types: Cigarettes   Smokeless tobacco: Never   Tobacco comments:    6 cigarettes daily  Substance and Sexual Activity   Alcohol use: Not Currently   Drug use: Never   Sexual activity: Not on file  Other Topics Concern   Not on file  Social History  Narrative   Not on file   Social Drivers of Health   Financial Resource Strain: Not on file  Food Insecurity: Not on file  Transportation Needs: Not on file  Physical Activity: Not on file  Stress: Not on file  Social Connections: Not on file  Intimate Partner Violence: Not on file      Review of Systems  Constitutional:  Positive for appetite change and unexpected weight change. Negative for chills and fatigue.  HENT:  Negative for congestion, rhinorrhea, sneezing and sore throat.   Eyes:  Negative for redness.  Respiratory: Negative.  Negative for cough, chest tightness, shortness of breath and wheezing.   Cardiovascular: Negative.  Negative for chest pain and palpitations.  Gastrointestinal:  Negative for abdominal pain, constipation, diarrhea, nausea and vomiting.  Endocrine: Negative for polydipsia and polyuria.  Genitourinary:  Negative for dysuria and frequency.  Musculoskeletal:  Negative for arthralgias, back pain, joint swelling and neck pain.  Skin:  Negative for rash.  Neurological: Negative.  Negative for tremors and numbness.  Hematological:  Negative for adenopathy. Does not bruise/bleed easily.  Psychiatric/Behavioral:  Negative for behavioral problems (Depression), sleep disturbance and suicidal ideas. The patient is not nervous/anxious.     Vital Signs: BP 130/78   Pulse (!) 105   Temp 97.9 F (36.6 C)   Resp 16   Ht 5\' 4"  (1.626 m)   Wt 210 lb 6.4 oz (95.4 kg)   SpO2 98%   BMI 36.12 kg/m    Physical Exam Vitals reviewed.  Constitutional:      General: She is not in acute distress.    Appearance: Normal appearance. She is obese. She is not ill-appearing.  HENT:     Head: Normocephalic and atraumatic.  Eyes:     Pupils: Pupils are equal, round, and reactive to light.  Cardiovascular:     Rate and Rhythm: Normal rate and regular rhythm.  Pulmonary:     Effort: Pulmonary effort is normal. No respiratory distress.  Neurological:     Mental  Status: She is alert and oriented to person, place, and time.  Psychiatric:        Mood and Affect: Mood normal.        Behavior: Behavior normal.        Assessment/Plan: 1. Type 2 diabetes mellitus with other specified complication, with long-term current use of insulin (HCC) (Primary) Tresiba dose increased to 40 units daily, also discussed taking the insulin at bedtime instead of in the morning. Continue farxiga as prescribed. Continue rybelsus for now. Stop rybelsus once she starts mounjaro when it is approved by her insurance.    2. Essential hypertension Stable, continue amlodipine and triamterene-hydrochlorothiazide as prescribed.   3. Mixed hyperlipidemia Continue rosuvastatin and ezetimibe as prescribed.   4. Dysuria Lab order for urinalysis and urine culture given to patient, patient instructed to go to [facility] to submit a urine  sample if she continues to have symptoms since she was unable to give a sample today.  - Urinalysis, Routine w reflex microscopic - CULTURE, URINE COMPREHENSIVE  5. Screening for colorectal cancer Cologuard test ordered  - Cologuard  6. Encounter for screening mammogram for malignant neoplasm of breast Routine mammogram ordered  - MM 3D SCREENING MAMMOGRAM BILATERAL BREAST; Future   General Counseling: Tammy Beasley verbalizes understanding of the findings of todays visit and agrees with plan of treatment. I have discussed any further diagnostic evaluation that may be needed or ordered today. We also reviewed her medications today. she has been encouraged to call the office with any questions or concerns that should arise related to todays visit.    Orders Placed This Encounter  Procedures   CULTURE, URINE COMPREHENSIVE   Urinalysis, Routine w reflex microscopic    Meds ordered this encounter  Medications   tirzepatide (MOUNJARO) 5 MG/0.5ML Pen    Sig: Inject 5 mg into the skin once a week.    Dispense:  2 mL    Refill:  5    Please send  prior authorization request asap to fax # [phone], dx code E11.65, most resent A1c 11.3   TRESIBA FLEXTOUCH 100 UNIT/ML FlexTouch Pen    Sig: Inject 40 Units into the skin daily.    Dispense:  15 mL    Refill:  5    Dx code E11.65    Return in about 17 days (around 10/03/2023) for F/U, Prisca Gearing PCP sugars, dexcom and insulin .   Total time spent:30 Minutes Time spent includes review of chart, medications, test results, and follow up plan with the patient.   [place] Controlled Substance Database was reviewed by me.  This patient was seen by Sallyanne Kuster, FNP-C in collaboration with Dr. Beverely Risen as a part of collaborative care agreement.   Ravindra Baranek R. Tedd Sias, MSN, FNP-C Internal medicine

## 2023-09-18 ENCOUNTER — Telehealth: Payer: Self-pay

## 2023-09-18 NOTE — Telephone Encounter (Signed)
Completed P.A. for patient's Mounjaro. 

## 2023-09-20 LAB — URINALYSIS, ROUTINE W REFLEX MICROSCOPIC
Bilirubin, UA: NEGATIVE
Ketones, UA: NEGATIVE
Leukocytes,UA: NEGATIVE
Nitrite, UA: NEGATIVE
Protein,UA: NEGATIVE
RBC, UA: NEGATIVE
Specific Gravity, UA: 1.014 (ref 1.005–1.030)
Urobilinogen, Ur: 0.2 mg/dL (ref 0.2–1.0)
pH, UA: 6.5 (ref 5.0–7.5)

## 2023-09-22 LAB — CULTURE, URINE COMPREHENSIVE

## 2023-10-10 ENCOUNTER — Ambulatory Visit (INDEPENDENT_AMBULATORY_CARE_PROVIDER_SITE_OTHER): Payer: 59 | Admitting: Physician Assistant

## 2023-10-10 ENCOUNTER — Ambulatory Visit: Payer: 59 | Admitting: Nurse Practitioner

## 2023-10-10 ENCOUNTER — Encounter: Payer: Self-pay | Admitting: Nurse Practitioner

## 2023-10-10 VITALS — BP 142/70 | HR 91 | Temp 98.3°F | Resp 16 | Ht 64.0 in | Wt 213.0 lb

## 2023-10-10 DIAGNOSIS — Z794 Long term (current) use of insulin: Secondary | ICD-10-CM

## 2023-10-10 DIAGNOSIS — I10 Essential (primary) hypertension: Secondary | ICD-10-CM | POA: Diagnosis not present

## 2023-10-10 DIAGNOSIS — E1169 Type 2 diabetes mellitus with other specified complication: Secondary | ICD-10-CM | POA: Diagnosis not present

## 2023-10-10 LAB — POCT GLYCOSYLATED HEMOGLOBIN (HGB A1C): Hemoglobin A1C: 10.3 % — AB (ref 4.0–5.6)

## 2023-10-10 NOTE — Progress Notes (Signed)
 [facility] [address] [place], [place] [address]  Internal MEDICINE  Office Visit Note  Patient Name: Tammy Beasley  [id number]  [id number]  Date of Service: 10/10/2023  Chief Complaint  Patient presents with   Diabetes   Hypertension   Follow-up    HPI Pt is here for routine follow up -was able to start on mounjaro  and loves it, has done 3 weeks, had a misfire week. Has 1 more week left now of the 5mg . No S/E. Would like to continue at current dose for now. -BG in AM now 120-130, after meal maybe 200 vs prev 350. Great improvement -still taking 40units insulin  and Farxiga  in AM -has not been taking triamterene -hydrochlorothiazide  recently, got confused and was only taking amlodipine . Bp a little elevated in office for this reason and will restart this.   Current Medication: Outpatient Encounter Medications as of 10/10/2023  Medication Sig   Accu-Chek Softclix Lancets lancets Use as instructed to check blood sugars twice a day  E11.65   albuterol  (VENTOLIN  HFA) 108 (90 Base) MCG/ACT inhaler Inhale 2 puffs into the lungs every 6 (six) hours as needed for wheezing or shortness of breath.   amLODipine  (NORVASC ) 5 MG tablet Take one tab po at bedtime for blood pressure   Blood Glucose Monitoring Suppl (ACCU-CHEK GUIDE) w/Device KIT Use as directed DXE11.65   Continuous Glucose Sensor (DEXCOM G7 SENSOR) MISC Use one every 10 days for uncontrolled dm   dapagliflozin  propanediol (FARXIGA ) 10 MG TABS tablet Take 1 tablet (10 mg total) by mouth daily before breakfast.   ezetimibe  (ZETIA ) 10 MG tablet Take 10 mg by mouth daily.   glucose blood (ACCU-CHEK GUIDE) test strip Use as directed to check blood sugars twice a day  E11.65   mupirocin  ointment (BACTROBAN ) 2 % Apply 1 Application topically 2 (two) times daily. To affected area until healed.   ondansetron  (ZOFRAN ) 4 MG tablet Take 1 tablet (4 mg total) by mouth every 8 (eight) hours as needed for nausea or vomiting.    rosuvastatin  (CRESTOR ) 10 MG tablet Take 1 tablet (10 mg total) by mouth daily.   tirzepatide  (MOUNJARO ) 5 MG/0.5ML Pen Inject 5 mg into the skin once a week.   TRESIBA  FLEXTOUCH 100 UNIT/ML FlexTouch Pen Inject 40 Units into the skin daily.   triamcinolone  ointment (KENALOG ) 0.5 % Apply 1 Application topically 2 (two) times daily.   triamterene -hydrochlorothiazide  (MAXZIDE-25) 37.5-25 MG tablet Take 1 tablet by mouth daily.   Vitamin D , Ergocalciferol , (DRISDOL ) 1.25 MG (50000 UNIT) CAPS capsule Take 1 capsule (50,000 Units total) by mouth every 7 (seven) days.   [DISCONTINUED] Semaglutide  (RYBELSUS ) 7 MG TABS Take 1 tablet (7 mg total) by mouth daily.   No facility-administered encounter medications on file as of 10/10/2023.    Surgical History: Past Surgical History:  Procedure Laterality Date   BREAST LUMPECTOMY     celluilitus      Medical History: Past Medical History:  Diagnosis Date   Diabetes mellitus without complication (HCC)    Hypertension     Family History: Family History  Problem Relation Age of Onset   Renal Disease Father    Kidney failure Father    Diabetes Father     Social History   Socioeconomic History   Marital status: Single    Spouse name: Not on file   Number of children: Not on file   Years of education: Not on file   Highest education level: Not on file  Occupational  History   Not on file  Tobacco Use   Smoking status: Every Day    Types: Cigarettes   Smokeless tobacco: Never   Tobacco comments:    6 cigarettes daily  Substance and Sexual Activity   Alcohol use: Not Currently   Drug use: Never   Sexual activity: Not on file  Other Topics Concern   Not on file  Social History Narrative   Not on file   Social Drivers of Health   Financial Resource Strain: Not on file  Food Insecurity: Not on file  Transportation Needs: Not on file  Physical Activity: Not on file  Stress: Not on file  Social Connections: Not on file  Intimate  Partner Violence: Not on file      Review of Systems  Constitutional:  Negative for fatigue and fever.  HENT:  Negative for congestion, mouth sores and postnasal drip.   Respiratory:  Negative for cough.   Cardiovascular:  Negative for chest pain.  Gastrointestinal:  Negative for abdominal pain.  Genitourinary:  Negative for flank pain.  Musculoskeletal:  Negative for back pain.  Neurological:  Negative for headaches.  Psychiatric/Behavioral: Negative.  Negative for behavioral problems and suicidal ideas.     Vital Signs: BP (!) 142/70 Comment: 142/80  Pulse 91   Temp 98.3 F (36.8 C)   Resp 16   Ht 5' 4 (1.626 m)   Wt 213 lb (96.6 kg)   SpO2 99%   BMI 36.56 kg/m    Physical Exam Vitals reviewed.  Constitutional:      General: She is not in acute distress.    Appearance: Normal appearance. She is obese. She is not ill-appearing.  HENT:     Head: Normocephalic and atraumatic.  Eyes:     Pupils: Pupils are equal, round, and reactive to light.  Cardiovascular:     Rate and Rhythm: Normal rate and regular rhythm.  Pulmonary:     Effort: Pulmonary effort is normal. No respiratory distress.  Neurological:     Mental Status: She is alert and oriented to person, place, and time.  Psychiatric:        Mood and Affect: Mood normal.        Behavior: Behavior normal.        Assessment/Plan: 1. Type 2 diabetes mellitus with other specified complication, with long-term current use of insulin  (HCC) (Primary) - POCT glycosylated hemoglobin (Hb A1C) 10.3 which is improved from 11.3 last visit. Not controlled, however recently started mounjaro  and BG greatly improved and should see much further improvement on next A1c. Continue current medications. Could consider titrating up on mounjaro  in future if needed  2. Essential hypertension Elevated in office, but pt not taking triamterene -hydrochlorothiazide  and will restart now and continue amlodipine  as before. Continue to  monitor   General Counseling: jadis pitter understanding of the findings of todays visit and agrees with plan of treatment. I have discussed any further diagnostic evaluation that may be needed or ordered today. We also reviewed her medications today. she has been encouraged to call the office with any questions or concerns that should arise related to todays visit.    Orders Placed This Encounter  Procedures   POCT glycosylated hemoglobin (Hb A1C)    No orders of the defined types were placed in this encounter.   This patient was seen by Tinnie Pro, PA-C in collaboration with Dr. Sigrid Bathe as a part of collaborative care agreement.   Total time spent:30 Minutes Time spent  includes review of chart, medications, test results, and follow up plan with the patient.      Dr Fozia M Khan Internal medicine

## 2023-10-11 ENCOUNTER — Telehealth: Payer: Self-pay

## 2023-10-11 ENCOUNTER — Other Ambulatory Visit: Payer: Self-pay

## 2023-10-11 DIAGNOSIS — Z1211 Encounter for screening for malignant neoplasm of colon: Secondary | ICD-10-CM

## 2023-10-11 NOTE — Telephone Encounter (Signed)
 Gastroenterology Pre-Procedure Review  Request Date: 11/29/23 Requesting Physician: Dr. Therisa  PATIENT REVIEW QUESTIONS: The patient responded to the following health history questions as indicated:    1. Are you having any GI issues? no 2. Do you have a personal history of Polyps? no 3. Do you have a family history of Colon Cancer or Polyps? no 4. Diabetes Mellitus? Yes has been advised and noted on instructions to stop Mounjaro  (7) days prior to colonoscopy, stop Farxiga  (3) days prior to colonoscopy, take half of usual amount of Triseba the day before. 5. Joint replacements in the past 12 months?no 6. Major health problems in the past 3 months?no 7. Any artificial heart valves, MVP, or defibrillator?no    MEDICATIONS & ALLERGIES:    Patient reports the following regarding taking any anticoagulation/antiplatelet therapy:   Plavix, Coumadin, Eliquis, Xarelto, Lovenox , Pradaxa, Brilinta, or Effient? no Aspirin? no  Patient confirms/reports the following medications:  Current Outpatient Medications  Medication Sig Dispense Refill   Accu-Chek Softclix Lancets lancets Use as instructed to check blood sugars twice a day  E11.65 100 each 12   albuterol  (VENTOLIN  HFA) 108 (90 Base) MCG/ACT inhaler Inhale 2 puffs into the lungs every 6 (six) hours as needed for wheezing or shortness of breath. 8 g 0   amLODipine  (NORVASC ) 5 MG tablet Take one tab po at bedtime for blood pressure 90 tablet 1   Blood Glucose Monitoring Suppl (ACCU-CHEK GUIDE) w/Device KIT Use as directed DXE11.65 1 kit 0   Continuous Glucose Sensor (DEXCOM G7 SENSOR) MISC Use one every 10 days for uncontrolled dm 3 each 11   dapagliflozin  propanediol (FARXIGA ) 10 MG TABS tablet Take 1 tablet (10 mg total) by mouth daily before breakfast. 30 tablet 5   ezetimibe  (ZETIA ) 10 MG tablet Take 10 mg by mouth daily.     glucose blood (ACCU-CHEK GUIDE) test strip Use as directed to check blood sugars twice a day  E11.65 100 each 12    mupirocin  ointment (BACTROBAN ) 2 % Apply 1 Application topically 2 (two) times daily. To affected area until healed. 30 g 2   ondansetron  (ZOFRAN ) 4 MG tablet Take 1 tablet (4 mg total) by mouth every 8 (eight) hours as needed for nausea or vomiting. 15 tablet 0   rosuvastatin  (CRESTOR ) 10 MG tablet Take 1 tablet (10 mg total) by mouth daily. 90 tablet 3   tirzepatide  (MOUNJARO ) 5 MG/0.5ML Pen Inject 5 mg into the skin once a week. 2 mL 5   TRESIBA  FLEXTOUCH 100 UNIT/ML FlexTouch Pen Inject 40 Units into the skin daily. 15 mL 5   triamcinolone  ointment (KENALOG ) 0.5 % Apply 1 Application topically 2 (two) times daily. 45 g 1   triamterene -hydrochlorothiazide  (MAXZIDE-25) 37.5-25 MG tablet Take 1 tablet by mouth daily. 90 tablet 3   Vitamin D , Ergocalciferol , (DRISDOL ) 1.25 MG (50000 UNIT) CAPS capsule Take 1 capsule (50,000 Units total) by mouth every 7 (seven) days. 12 capsule 1   No current facility-administered medications for this visit.    Patient confirms/reports the following allergies:  Allergies  Allergen Reactions   Benazepril  Swelling   Wax [Beeswax] Itching and Swelling    No orders of the defined types were placed in this encounter.   AUTHORIZATION INFORMATION Primary Insurance: 1D#: Group #:  Secondary Insurance: 1D#: Group #:  SCHEDULE INFORMATION: Date: 11/29/23 Time: Location: [facility]

## 2023-10-21 ENCOUNTER — Ambulatory Visit (INDEPENDENT_AMBULATORY_CARE_PROVIDER_SITE_OTHER): Payer: 59 | Admitting: Nurse Practitioner

## 2023-10-21 ENCOUNTER — Encounter: Payer: Self-pay | Admitting: Nurse Practitioner

## 2023-10-21 VITALS — BP 150/80 | HR 84 | Temp 98.2°F | Resp 16 | Ht 64.0 in | Wt 208.0 lb

## 2023-10-21 DIAGNOSIS — Z79899 Other long term (current) drug therapy: Secondary | ICD-10-CM

## 2023-10-21 DIAGNOSIS — R52 Pain, unspecified: Secondary | ICD-10-CM | POA: Diagnosis not present

## 2023-10-21 DIAGNOSIS — J45909 Unspecified asthma, uncomplicated: Secondary | ICD-10-CM | POA: Diagnosis not present

## 2023-10-21 DIAGNOSIS — J209 Acute bronchitis, unspecified: Secondary | ICD-10-CM | POA: Diagnosis not present

## 2023-10-21 LAB — POCT INFLUENZA A/B
Influenza A, POC: NEGATIVE
Influenza B, POC: NEGATIVE

## 2023-10-21 MED ORDER — BENZONATATE 200 MG PO CAPS: 200.0000 mg | ORAL_CAPSULE | Freq: Two times a day (BID) | ORAL | 0 refills | Status: DC | PRN

## 2023-10-21 MED ORDER — DOXYCYCLINE HYCLATE 100 MG PO TABS: 100.0000 mg | ORAL_TABLET | Freq: Two times a day (BID) | ORAL | 0 refills | Status: AC

## 2023-10-21 MED ORDER — ALBUTEROL SULFATE HFA 108 (90 BASE) MCG/ACT IN AERS: 2.0000 | INHALATION_SPRAY | Freq: Four times a day (QID) | RESPIRATORY_TRACT | 5 refills | Status: DC | PRN

## 2023-10-21 NOTE — Progress Notes (Signed)
[facility] [address] [place], [place] [address]  Internal MEDICINE  Office Visit Note  Patient Name: Tammy Beasley  [id number]  [id number]  Date of Service: 10/21/2023  Chief Complaint  Patient presents with   Acute Visit    Since Saturday having fevers 101 at night this morning felt ok at 98 then went to 100.covid negative, cough     HPI Tammy Beasley presents for an acute sick visit for possible URI --onset of symptoms was Saturday.  --reports low grade fever 100-101, cough, fatigue, body aches, wheezing, chills, headache, lack of appetite --denies any sinus pressure, nasal congestion, nausea.  Negative for covid on home test.  Has not been tested for flu. Flu test done today and is negative for the flu.     Current Medication:  Outpatient Encounter Medications as of 10/21/2023  Medication Sig   Accu-Chek Softclix Lancets lancets Use as instructed to check blood sugars twice a day  E11.65   amLODipine (NORVASC) 5 MG tablet Take one tab po at bedtime for blood pressure   benzonatate (TESSALON) 200 MG capsule Take 1 capsule (200 mg total) by mouth 2 (two) times daily as needed for cough.   Blood Glucose Monitoring Suppl (ACCU-CHEK GUIDE) w/Device KIT Use as directed DXE11.65   Continuous Glucose Sensor (DEXCOM G7 SENSOR) MISC Use one every 10 days for uncontrolled dm   dapagliflozin propanediol (FARXIGA) 10 MG TABS tablet Take 1 tablet (10 mg total) by mouth daily before breakfast.   doxycycline (VIBRA-TABS) 100 MG tablet Take 1 tablet (100 mg total) by mouth 2 (two) times daily for 10 days. Take with food   ezetimibe (ZETIA) 10 MG tablet Take 10 mg by mouth daily.   glucose blood (ACCU-CHEK GUIDE) test strip Use as directed to check blood sugars twice a day  E11.65   mupirocin ointment (BACTROBAN) 2 % Apply 1 Application topically 2 (two) times daily. To affected area until healed.   ondansetron (ZOFRAN) 4 MG tablet Take 1 tablet (4 mg total) by mouth every 8  (eight) hours as needed for nausea or vomiting.   rosuvastatin (CRESTOR) 10 MG tablet Take 1 tablet (10 mg total) by mouth daily.   tirzepatide [facility]) 5 MG/0.5ML Pen Inject 5 mg into the skin once a week.   TRESIBA FLEXTOUCH 100 UNIT/ML FlexTouch Pen Inject 40 Units into the skin daily.   triamcinolone ointment (KENALOG) 0.5 % Apply 1 Application topically 2 (two) times daily.   triamterene-hydrochlorothiazide (MAXZIDE-25) 37.5-25 MG tablet Take 1 tablet by mouth daily.   Vitamin D, Ergocalciferol, (DRISDOL) 1.25 MG (50000 UNIT) CAPS capsule Take 1 capsule (50,000 Units total) by mouth every 7 (seven) days.   [DISCONTINUED] albuterol (VENTOLIN HFA) 108 (90 Base) MCG/ACT inhaler Inhale 2 puffs into the lungs every 6 (six) hours as needed for wheezing or shortness of breath.   albuterol (VENTOLIN HFA) 108 (90 Base) MCG/ACT inhaler Inhale 2 puffs into the lungs every 6 (six) hours as needed for wheezing or shortness of breath.   No facility-administered encounter medications on file as of 10/21/2023.      Medical History: Past Medical History:  Diagnosis Date   Diabetes mellitus without complication (HCC)    Hypertension      Vital Signs: BP (!) 150/80   Pulse 84   Temp 98.2 F (36.8 C)   Resp 16   Ht 5\' 4"  (1.626 m)   Wt 208 lb (94.3 kg)   SpO2 97%   BMI 35.70 kg/m  Review of Systems  Constitutional:  Positive for appetite change, chills, fatigue and fever.  HENT:  Positive for congestion, postnasal drip and sore throat. Negative for rhinorrhea, sinus pressure, sinus pain and sneezing.   Respiratory:  Positive for cough, chest tightness, shortness of breath and wheezing.   Cardiovascular: Negative.  Negative for chest pain and palpitations.  Gastrointestinal: Negative.  Negative for diarrhea, nausea and vomiting.  Genitourinary: Negative.   Musculoskeletal:  Positive for myalgias (body aches).  Neurological:  Positive for headaches.    Physical Exam Vitals reviewed.   Constitutional:      General: She is not in acute distress.    Appearance: Normal appearance. She is obese. She is ill-appearing.  HENT:     Head: Normocephalic and atraumatic.     Nose: Congestion present. No rhinorrhea.     Mouth/Throat:     Mouth: Mucous membranes are moist.     Pharynx: Posterior oropharyngeal erythema present.  Eyes:     Pupils: Pupils are equal, round, and reactive to light.  Cardiovascular:     Rate and Rhythm: Normal rate and regular rhythm.     Heart sounds: Normal heart sounds. No murmur heard.    No friction rub. No gallop.  Pulmonary:     Effort: Pulmonary effort is normal. No accessory muscle usage or respiratory distress.     Breath sounds: Examination of the right-upper field reveals wheezing. Examination of the left-upper field reveals wheezing. Examination of the right-middle field reveals wheezing. Examination of the left-middle field reveals wheezing. Examination of the right-lower field reveals decreased breath sounds. Examination of the left-lower field reveals decreased breath sounds. Decreased breath sounds and wheezing present.  Neurological:     Mental Status: She is alert and oriented to person, place, and time.  Psychiatric:        Mood and Affect: Mood normal.        Behavior: Behavior normal.       Assessment/Plan: 1. Acute bronchitis with asthma (Primary) Doxycycline prescribed, take until gone. Benzonatate prescribed for cough. Continue prn use of albuterol inhaler for SOB and wheezing.  - doxycycline (VIBRA-TABS) 100 MG tablet; Take 1 tablet (100 mg total) by mouth 2 (two) times daily for 10 days. Take with food  Dispense: 20 tablet; Refill: 0 - benzonatate (TESSALON) 200 MG capsule; Take 1 capsule (200 mg total) by mouth 2 (two) times daily as needed for cough.  Dispense: 30 capsule; Refill: 0 - albuterol (VENTOLIN HFA) 108 (90 Base) MCG/ACT inhaler; Inhale 2 puffs into the lungs every 6 (six) hours as needed for wheezing or  shortness of breath.  Dispense: 8 g; Refill: 5  2. Body aches Negative for flu A/B - POCT Influenza A/B   General Counseling: Tammy Beasley verbalizes understanding of the findings of todays visit and agrees with plan of treatment. I have discussed any further diagnostic evaluation that may be needed or ordered today. We also reviewed her medications today. she has been encouraged to call the office with any questions or concerns that should arise related to todays visit.    Counseling:    Orders Placed This Encounter  Procedures   POCT Influenza A/B    Meds ordered this encounter  Medications   doxycycline (VIBRA-TABS) 100 MG tablet    Sig: Take 1 tablet (100 mg total) by mouth 2 (two) times daily for 10 days. Take with food    Dispense:  20 tablet    Refill:  0    Fill new  script today   benzonatate (TESSALON) 200 MG capsule    Sig: Take 1 capsule (200 mg total) by mouth 2 (two) times daily as needed for cough.    Dispense:  30 capsule    Refill:  0    Fill new script today   albuterol (VENTOLIN HFA) 108 (90 Base) MCG/ACT inhaler    Sig: Inhale 2 puffs into the lungs every 6 (six) hours as needed for wheezing or shortness of breath.    Dispense:  8 g    Refill:  5    Return if symptoms worsen or fail to improve.  [place] Controlled Substance Database was reviewed by me for overdose risk score (ORS)  Time spent:20 Minutes Time spent with patient included reviewing progress notes, labs, imaging studies, and discussing plan for follow up.   This patient was seen by Sallyanne Kuster, FNP-C in collaboration with Dr. Beverely Risen as a part of collaborative care agreement.  Maryjayne Kleven R. Tedd Sias, MSN, FNP-C Internal Medicine

## 2023-10-22 ENCOUNTER — Encounter: Payer: Self-pay | Admitting: Nurse Practitioner

## 2023-11-07 ENCOUNTER — Ambulatory Visit: Payer: 59 | Admitting: Nurse Practitioner

## 2023-11-13 ENCOUNTER — Ambulatory Visit: Payer: 59 | Admitting: Nurse Practitioner

## 2023-11-22 ENCOUNTER — Encounter: Payer: Self-pay | Admitting: Gastroenterology

## 2023-11-26 MED ORDER — NA SULFATE-K SULFATE-MG SULF 17.5-3.13-1.6 GM/177ML PO SOLN: 1.0000 | Freq: Once | ORAL | 0 refills | Status: AC

## 2023-11-26 NOTE — Addendum Note (Signed)
 Addended by: Avie Arenas on: 11/26/2023 10:42 AM   Modules accepted: Orders

## 2023-11-26 NOTE — Telephone Encounter (Signed)
 Patient contacted office to reschedule her 11/29/23 colonoscopy with Dr. Tobi Bastos.  Colonoscopy has been rescheduled to 01/13/24.  Endo has been notified of procedure date change.  Instructions updated.  Referral updated.  Thanks,  [place], [place]

## 2024-01-02 ENCOUNTER — Encounter: Payer: 59 | Admitting: Nurse Practitioner

## 2024-01-06 ENCOUNTER — Encounter: Payer: Self-pay | Admitting: Gastroenterology

## 2024-01-06 ENCOUNTER — Ambulatory Visit: Payer: 59 | Admitting: Nurse Practitioner

## 2024-01-06 ENCOUNTER — Encounter: Payer: Self-pay | Admitting: Nurse Practitioner

## 2024-01-06 VITALS — BP 138/84 | HR 71 | Temp 98.2°F | Resp 16 | Ht 64.0 in | Wt 212.2 lb

## 2024-01-06 DIAGNOSIS — E1169 Type 2 diabetes mellitus with other specified complication: Secondary | ICD-10-CM

## 2024-01-06 DIAGNOSIS — E559 Vitamin D deficiency, unspecified: Secondary | ICD-10-CM

## 2024-01-06 DIAGNOSIS — E1159 Type 2 diabetes mellitus with other circulatory complications: Secondary | ICD-10-CM

## 2024-01-06 DIAGNOSIS — Z794 Long term (current) use of insulin: Secondary | ICD-10-CM | POA: Diagnosis not present

## 2024-01-06 DIAGNOSIS — E1165 Type 2 diabetes mellitus with hyperglycemia: Secondary | ICD-10-CM

## 2024-01-06 DIAGNOSIS — Z0001 Encounter for general adult medical examination with abnormal findings: Secondary | ICD-10-CM

## 2024-01-06 DIAGNOSIS — I10 Essential (primary) hypertension: Secondary | ICD-10-CM

## 2024-01-06 DIAGNOSIS — I152 Hypertension secondary to endocrine disorders: Secondary | ICD-10-CM

## 2024-01-06 DIAGNOSIS — E785 Hyperlipidemia, unspecified: Secondary | ICD-10-CM

## 2024-01-06 MED ORDER — AMLODIPINE BESYLATE 5 MG PO TABS: ORAL_TABLET | ORAL | 1 refills | Status: DC

## 2024-01-06 MED ORDER — DAPAGLIFLOZIN PROPANEDIOL 10 MG PO TABS: 10.0000 mg | ORAL_TABLET | Freq: Every day | ORAL | 5 refills | Status: DC

## 2024-01-06 MED ORDER — TRESIBA FLEXTOUCH 100 UNIT/ML ~~LOC~~ SOPN: 10.0000 [IU] | PEN_INJECTOR | Freq: Every day | SUBCUTANEOUS | Status: DC

## 2024-01-06 MED ORDER — TIRZEPATIDE 5 MG/0.5ML ~~LOC~~ SOAJ: 5.0000 mg | SUBCUTANEOUS | 5 refills | Status: DC

## 2024-01-06 NOTE — Progress Notes (Signed)
 [facility] [address] [place], [place] [address]  Internal MEDICINE  Office Visit Note  Patient Name: Tammy Beasley  [id number]  [id number]  Date of Service: 01/06/2024  Chief Complaint  Patient presents with   Diabetes   Hypertension   Annual Exam    HPI Tammy Beasley presents for an annual well visit and physical exam.  Well-appearing 53 y.o. female with diabetes, hypertension, high cholesterol, vitamin D  deficiency,  Routine CRC screening: has colonoscopy scheduled for 01/10/24 Routine mammogram: going to schedule soon  Pap smear: due in 2027 Eye exam: [employer]  foot exam: done today  Labs: due for routine labs, last A1c was done in January and her A1c was 10.3.  New or worsening pain: off and on right leg pain when she lays on her right side, feels achy.  Other concerns: none     Current Medication: Outpatient Encounter Medications as of 01/06/2024  Medication Sig   Accu-Chek Softclix Lancets lancets Use as instructed to check blood sugars twice a day  E11.65   albuterol  (VENTOLIN  HFA) 108 (90 Base) MCG/ACT inhaler Inhale 2 puffs into the lungs every 6 (six) hours as needed for wheezing or shortness of breath.   benzonatate  (TESSALON ) 200 MG capsule Take 1 capsule (200 mg total) by mouth 2 (two) times daily as needed for cough.   Blood Glucose Monitoring Suppl (ACCU-CHEK GUIDE) w/Device KIT Use as directed DXE11.65   Continuous Glucose Sensor (DEXCOM G7 SENSOR) MISC Use one every 10 days for uncontrolled dm   ezetimibe  (ZETIA ) 10 MG tablet Take 10 mg by mouth daily.   glucose blood (ACCU-CHEK GUIDE) test strip Use as directed to check blood sugars twice a day  E11.65   mupirocin  ointment (BACTROBAN ) 2 % Apply 1 Application topically 2 (two) times daily. To affected area until healed.   ondansetron  (ZOFRAN ) 4 MG tablet Take 1 tablet (4 mg total) by mouth every 8 (eight) hours as needed for nausea or vomiting.   rosuvastatin  (CRESTOR ) 10 MG tablet Take 1  tablet (10 mg total) by mouth daily.   triamcinolone  ointment (KENALOG ) 0.5 % Apply 1 Application topically 2 (two) times daily.   triamterene -hydrochlorothiazide  (MAXZIDE-25) 37.5-25 MG tablet Take 1 tablet by mouth daily.   Vitamin D , Ergocalciferol , (DRISDOL ) 1.25 MG (50000 UNIT) CAPS capsule Take 1 capsule (50,000 Units total) by mouth every 7 (seven) days.   [DISCONTINUED] amLODipine  (NORVASC ) 5 MG tablet Take one tab po at bedtime for blood pressure   [DISCONTINUED] dapagliflozin  propanediol (FARXIGA ) 10 MG TABS tablet Take 1 tablet (10 mg total) by mouth daily before breakfast.   [DISCONTINUED] tirzepatide  (MOUNJARO ) 5 MG/0.5ML Pen Inject 5 mg into the skin once a week.   [DISCONTINUED] TRESIBA  FLEXTOUCH 100 UNIT/ML FlexTouch Pen Inject 40 Units into the skin daily.   amLODipine  (NORVASC ) 5 MG tablet Take one tab po at bedtime for blood pressure   dapagliflozin  propanediol (FARXIGA ) 10 MG TABS tablet Take 1 tablet (10 mg total) by mouth daily before breakfast.   tirzepatide  (MOUNJARO ) 5 MG/0.5ML Pen Inject 5 mg into the skin once a week.   TRESIBA  FLEXTOUCH 100 UNIT/ML FlexTouch Pen Inject 10-15 Units into the skin daily. If glucose is elevated   No facility-administered encounter medications on file as of 01/06/2024.    Surgical History: Past Surgical History:  Procedure Laterality Date   BREAST LUMPECTOMY     celluilitus      Medical History: Past Medical History:  Diagnosis Date   Diabetes mellitus without  complication (HCC)    Hypertension     Family History: Family History  Problem Relation Age of Onset   Renal Disease Father    Kidney failure Father    Diabetes Father     Social History   Socioeconomic History   Marital status: Single    Spouse name: Not on file   Number of children: Not on file   Years of education: Not on file   Highest education level: Not on file  Occupational History   Not on file  Tobacco Use   Smoking status: Every Day    Types:  Cigarettes   Smokeless tobacco: Never   Tobacco comments:    6 cigarettes daily  Substance and Sexual Activity   Alcohol use: Not Currently   Drug use: Never   Sexual activity: Not on file  Other Topics Concern   Not on file  Social History Narrative   Not on file   Social Drivers of Health   Financial Resource Strain: Not on file  Food Insecurity: Not on file  Transportation Needs: Not on file  Physical Activity: Not on file  Stress: Not on file  Social Connections: Not on file  Intimate Partner Violence: Not on file      Review of Systems  Vital Signs: BP 138/84   Pulse 71   Temp 98.2 F (36.8 C)   Resp 16   Ht 5\' 4"  (1.626 m)   Wt 212 lb 3.2 oz (96.3 kg)   SpO2 99%   BMI 36.42 kg/m    Physical Exam     Assessment/Plan: 1. Encounter for routine adult health examination with abnormal findings (Primary) Age-appropriate preventive screenings and vaccinations discussed, annual physical exam completed. Routine labs for health maintenance ordered, due below. PHM updated.   - CBC with Differential/Platelet - CMP14+EGFR - Lipid Profile - TSH + free T4 - Vitamin D  (25 hydroxy) - Hgb A1C w/o eAG  2. Type 2 diabetes mellitus with other specified complication, with long-term current use of insulin  (HCC) Urine sent for microalbumin/creatinine ratio. Routine labs ordered. Continue mounjaro , farxiga  and tresiba  as prescribed.  - Urine Microalbumin w/creat. ratio - CBC with Differential/Platelet - CMP14+EGFR - Lipid Profile - TSH + free T4 - Hgb A1C w/o eAG - tirzepatide  (MOUNJARO ) 5 MG/0.5ML Pen; Inject 5 mg into the skin once a week.  Dispense: 2 mL; Refill: 5 - dapagliflozin  propanediol (FARXIGA ) 10 MG TABS tablet; Take 1 tablet (10 mg total) by mouth daily before breakfast.  Dispense: 30 tablet; Refill: 5 - TRESIBA  FLEXTOUCH 100 UNIT/ML FlexTouch Pen; Inject 10-15 Units into the skin daily. If glucose is elevated  3. Hypertension associated with type 2  diabetes mellitus (HCC) Continue amlodipine  and triamterene -hydrochlorothiazide  as prescribed, routine labs ordered - CBC with Differential/Platelet - CMP14+EGFR - Lipid Profile - amLODipine  (NORVASC ) 5 MG tablet; Take one tab po at bedtime for blood pressure  Dispense: 90 tablet; Refill: 1  4. Hyperlipidemia associated with type 2 diabetes mellitus (HCC) Routine labs ordered  - CBC with Differential/Platelet - CMP14+EGFR - Lipid Profile - TSH + free T4  5. Vitamin D  deficiency Routine lab ordered  - Vitamin D  (25 hydroxy)     General Counseling: Jamira verbalizes understanding of the findings of todays visit and agrees with plan of treatment. I have discussed any further diagnostic evaluation that may be needed or ordered today. We also reviewed her medications today. she has been encouraged to call the office with any questions or concerns that  should arise related to todays visit.    Orders Placed This Encounter  Procedures   Urine Microalbumin w/creat. ratio   CBC with Differential/Platelet   CMP14+EGFR   Lipid Profile   TSH + free T4   Vitamin D  (25 hydroxy)   Hgb A1C w/o eAG    Meds ordered this encounter  Medications   tirzepatide  (MOUNJARO ) 5 MG/0.5ML Pen    Sig: Inject 5 mg into the skin once a week.    Dispense:  2 mL    Refill:  5    Please send prior authorization request asap to fax # [phone], dx code E11.65, most resent A1c 11.3   dapagliflozin  propanediol (FARXIGA ) 10 MG TABS tablet    Sig: Take 1 tablet (10 mg total) by mouth daily before breakfast.    Dispense:  30 tablet    Refill:  5    Please put refills on file   amLODipine  (NORVASC ) 5 MG tablet    Sig: Take one tab po at bedtime for blood pressure    Dispense:  90 tablet    Refill:  1   TRESIBA  FLEXTOUCH 100 UNIT/ML FlexTouch Pen    Sig: Inject 10-15 Units into the skin daily. If glucose is elevated    Dx code E11.65    Return in about 4 weeks (around 02/03/2024) for F/U, Labs, Jennier Schissler  PCP.   Total time spent:30 Minutes Time spent includes review of chart, medications, test results, and follow up plan with the patient.   [place] Controlled Substance Database was reviewed by me.  This patient was seen by Laurence Pons, FNP-C in collaboration with Dr. Verneta Gone as a part of collaborative care agreement.  Yossef Gilkison R. Bobbi Burow, MSN, FNP-C Internal medicine

## 2024-01-07 LAB — MICROALBUMIN / CREATININE URINE RATIO
Creatinine, Urine: 230.6 mg/dL
Microalb/Creat Ratio: 8 mg/g{creat} (ref 0–29)
Microalbumin, Urine: 18.3 ug/mL

## 2024-01-13 ENCOUNTER — Ambulatory Visit: Admitting: Certified Registered"

## 2024-01-13 ENCOUNTER — Ambulatory Visit
Admission: RE | Admit: 2024-01-13 | Discharge: 2024-01-13 | Disposition: A | Discharge status: Home / Self Care | Payer: 59 | Source: Ambulatory Visit | Attending: Gastroenterology | Admitting: Gastroenterology

## 2024-01-13 ENCOUNTER — Encounter
Admission: RE | Disposition: A | Discharge status: Home / Self Care | Payer: Self-pay | Source: Ambulatory Visit | Attending: Gastroenterology

## 2024-01-13 ENCOUNTER — Encounter: Payer: Self-pay | Admitting: Gastroenterology

## 2024-01-13 DIAGNOSIS — Z7984 Long term (current) use of oral hypoglycemic drugs: Secondary | ICD-10-CM | POA: Diagnosis not present

## 2024-01-13 DIAGNOSIS — Z1211 Encounter for screening for malignant neoplasm of colon: Secondary | ICD-10-CM | POA: Diagnosis not present

## 2024-01-13 DIAGNOSIS — Z7985 Long-term (current) use of injectable non-insulin antidiabetic drugs: Secondary | ICD-10-CM | POA: Diagnosis present

## 2024-01-13 DIAGNOSIS — Z794 Long term (current) use of insulin: Secondary | ICD-10-CM | POA: Diagnosis present

## 2024-01-13 DIAGNOSIS — F17210 Nicotine dependence, cigarettes, uncomplicated: Secondary | ICD-10-CM | POA: Insufficient documentation

## 2024-01-13 DIAGNOSIS — I10 Essential (primary) hypertension: Secondary | ICD-10-CM | POA: Diagnosis not present

## 2024-01-13 DIAGNOSIS — K635 Polyp of colon: Secondary | ICD-10-CM

## 2024-01-13 DIAGNOSIS — E119 Type 2 diabetes mellitus without complications: Secondary | ICD-10-CM | POA: Insufficient documentation

## 2024-01-13 DIAGNOSIS — D126 Benign neoplasm of colon, unspecified: Secondary | ICD-10-CM

## 2024-01-13 HISTORY — PX: COLONOSCOPY WITH PROPOFOL: SHX5780

## 2024-01-13 HISTORY — PX: POLYPECTOMY: SHX149

## 2024-01-13 SURGERY — COLONOSCOPY WITH PROPOFOL
Anesthesia: General

## 2024-01-13 MED ORDER — SODIUM CHLORIDE 0.9 % IV SOLN: INTRAVENOUS | Status: DC

## 2024-01-13 MED ORDER — LIDOCAINE HCL (CARDIAC) PF 100 MG/5ML IV SOSY
PREFILLED_SYRINGE | INTRAVENOUS | Status: DC | PRN
  Administered 2024-01-13: 100 mg via INTRAVENOUS

## 2024-01-13 MED ORDER — PROPOFOL 500 MG/50ML IV EMUL
INTRAVENOUS | Status: DC | PRN
  Administered 2024-01-13: 165 ug/kg/min via INTRAVENOUS

## 2024-01-13 MED ORDER — PROPOFOL 10 MG/ML IV BOLUS
INTRAVENOUS | Status: DC | PRN
  Administered 2024-01-13: 20 mg via INTRAVENOUS
  Administered 2024-01-13: 60 mg via INTRAVENOUS

## 2024-01-13 MED ORDER — STERILE WATER FOR IRRIGATION IR SOLN
Status: DC | PRN
  Administered 2024-01-13: 60 mL

## 2024-01-13 NOTE — Anesthesia Preprocedure Evaluation (Signed)
 Anesthesia Evaluation  Patient identified by MRN, date of birth, ID band Patient awake    Reviewed: Allergy & Precautions, NPO status , Patient's Chart, lab work & pertinent test results  Airway Mallampati: III  TM Distance: >3 FB Neck ROM: full    Dental  (+) Chipped   Pulmonary neg pulmonary ROS, Current Smoker   Pulmonary exam normal        Cardiovascular hypertension, negative cardio ROS Normal cardiovascular exam     Neuro/Psych negative neurological ROS  negative psych ROS   GI/Hepatic negative GI ROS, Neg liver ROS,,,  Endo/Other  negative endocrine ROSdiabetes    Renal/GU negative Renal ROS  negative genitourinary   Musculoskeletal   Abdominal   Peds  Hematology negative hematology ROS (+)   Anesthesia Other Findings Past Medical History: No date: Diabetes mellitus without complication (HCC) No date: Hypertension  Past Surgical History: No date: BREAST LUMPECTOMY No date: celluilitus  BMI    Body Mass Index: 36.39 kg/m      Reproductive/Obstetrics negative OB ROS                             Anesthesia Physical Anesthesia Plan  ASA: 2  Anesthesia Plan: General   Post-op Pain Management: Minimal or no pain anticipated   Induction: Intravenous  PONV Risk Score and Plan: 3 and Propofol infusion, TIVA and Ondansetron  Airway Management Planned: Nasal Cannula  Additional Equipment: None  Intra-op Plan:   Post-operative Plan:   Informed Consent: I have reviewed the patients History and Physical, chart, labs and discussed the procedure including the risks, benefits and alternatives for the proposed anesthesia with the patient or authorized representative who has indicated his/her understanding and acceptance.     Dental advisory given  Plan Discussed with: CRNA and Surgeon  Anesthesia Plan Comments: (Discussed risks of anesthesia with patient, including  possibility of difficulty with spontaneous ventilation under anesthesia necessitating airway intervention, PONV, and rare risks such as cardiac or respiratory or neurological events, and allergic reactions. Discussed the role of CRNA in patient's perioperative care. Patient understands.)       Anesthesia Quick Evaluation

## 2024-01-13 NOTE — Anesthesia Postprocedure Evaluation (Signed)
 Anesthesia Post Note  Patient: Tammy Beasley  Procedure(s) Performed: COLONOSCOPY WITH PROPOFOL POLYPECTOMY, INTESTINE  Patient location during evaluation: Endoscopy Anesthesia Type: General Level of consciousness: awake and alert Pain management: pain level controlled Vital Signs Assessment: post-procedure vital signs reviewed and stable Respiratory status: spontaneous breathing, nonlabored ventilation, respiratory function stable and patient connected to nasal cannula oxygen Cardiovascular status: blood pressure returned to baseline and stable Postop Assessment: no apparent nausea or vomiting Anesthetic complications: no  No notable events documented.   Last Vitals:  Vitals:   01/13/24 0925 01/13/24 0935  BP: 92/70   Pulse: 84 64  Resp: 18 20  Temp:    SpO2: 99% 100%    Last Pain:  Vitals:   01/13/24 0935  TempSrc:   PainSc: 0-No pain                 Enrique Harvest

## 2024-01-13 NOTE — Op Note (Signed)
 [facility] Gastroenterology Patient Name: Tammy Beasley Procedure Date: 01/13/2024 8:39 AM MRN: [id number] Account #: [id number] Date of Birth: 01-Feb-1971 Admit Type: Outpatient Age: 53 Room: [facility] ENDO ROOM 1 Gender: Female Note Status: Finalized Instrument Name: Charlyn Cooley [id number] Procedure:             Colonoscopy Indications:           Screening for colorectal malignant neoplasm Providers:             Luke Salaam MD, MD Referring MD:          Laurence Pons (Referring MD) Medicines:             Monitored Anesthesia Care Complications:         No immediate complications. Procedure:             Pre-Anesthesia Assessment:                        - Prior to the procedure, a History and Physical was                         performed, and patient medications, allergies and                         sensitivities were reviewed. The patient's tolerance                         of previous anesthesia was reviewed.                        - The risks and benefits of the procedure and the                         sedation options and risks were discussed with the                         patient. All questions were answered and informed                         consent was obtained.                        - ASA Grade Assessment: II - A patient with mild                         systemic disease.                        After obtaining informed consent, the colonoscope was                         passed under direct vision. Throughout the procedure,                         the patient's blood pressure, pulse, and oxygen                         saturations were monitored continuously. The                         Colonoscope was introduced through  the anus and                         advanced to the the cecum, identified by the                         appendiceal orifice. The colonoscopy was performed                         with ease. The patient tolerated the procedure well.                          The quality of the bowel preparation was excellent.                         The ileocecal valve, appendiceal orifice, and rectum                         were photographed. Findings:      The perianal and digital rectal examinations were normal.      A 5 mm polyp was found in the sigmoid colon. The polyp was sessile. The       polyp was removed with a cold snare. Resection and retrieval were       complete.      The exam was otherwise without abnormality on direct and retroflexion       views.      The exam was otherwise without abnormality on direct and retroflexion       views. Impression:            - One 5 mm polyp in the sigmoid colon, removed with a                         cold snare. Resected and retrieved.                        - The examination was otherwise normal on direct and                         retroflexion views.                        - The examination was otherwise normal on direct and                         retroflexion views. Recommendation:        - Discharge patient to home (with escort).                        - Resume previous diet.                        - Continue present medications.                        - Await pathology results.                        - Repeat colonoscopy for surveillance based on  pathology results. Procedure Code(s):     --- Professional ---                        [phone], Colonoscopy, flexible; with removal of                         tumor(s), polyp(s), or other lesion(s) by snare                         technique Diagnosis Code(s):     --- Professional ---                        Z12.11, Encounter for screening for malignant neoplasm                         of colon                        D12.5, Benign neoplasm of sigmoid colon CPT copyright 2022 American Medical Association. All rights reserved. The codes documented in this report are preliminary and upon coder review may  be revised  to meet current compliance requirements. Luke Salaam, MD Luke Salaam MD, MD 01/13/2024 9:15:20 AM This report has been signed electronically. Number of Addenda: 0 Note Initiated On: 01/13/2024 8:39 AM Scope Withdrawal Time: 0 hours 7 minutes 4 seconds  Total Procedure Duration: 0 hours 9 minutes 2 seconds  Estimated Blood Loss:  Estimated blood loss: none.      [facility]

## 2024-01-13 NOTE — Transfer of Care (Signed)
 Immediate Anesthesia Transfer of Care Note  Patient: Tammy Beasley  Procedure(s) Performed: COLONOSCOPY WITH PROPOFOL POLYPECTOMY, INTESTINE  Patient Location: Endoscopy Unit  Anesthesia Type:General  Level of Consciousness: awake, drowsy, and patient cooperative  Airway & Oxygen Therapy: Patient Spontanous Breathing and Patient connected to face mask oxygen  Post-op Assessment: Report given to RN and Post -op Vital signs reviewed and stable  Post vital signs: Reviewed and stable  Last Vitals:  Vitals Value Taken Time  BP 91/61 01/13/24 0914  Temp 35.8 C 01/13/24 0915  Pulse 77 01/13/24 0920  Resp 20 01/13/24 0920  SpO2 100 % 01/13/24 0920  Vitals shown include unfiled device data.  Last Pain:  Vitals:   01/13/24 0915  TempSrc: Temporal  PainSc: 0-No pain         Complications: No notable events documented.

## 2024-01-13 NOTE — H&P (Signed)
 [name], MD [address], [address], [place], [place], [address] [address], [address], [place], [place], [address] Phone: [phone]  Fax: [phone]  Primary Care Physician:  Sallyanne Kuster, NP   Pre-Procedure History & Physical: HPI:  Tammy Beasley is a 53 y.o. female is here for an colonoscopy.   Past Medical History:  Diagnosis Date   Diabetes mellitus without complication (HCC)    Hypertension     Past Surgical History:  Procedure Laterality Date   BREAST LUMPECTOMY     celluilitus      Prior to Admission medications   Medication Sig Start Date End Date Taking? Authorizing Provider  Accu-Chek Softclix Lancets lancets Use as instructed to check blood sugars twice a day  E11.65 04/10/23   Sallyanne Kuster, NP  albuterol (VENTOLIN HFA) 108 (90 Base) MCG/ACT inhaler Inhale 2 puffs into the lungs every 6 (six) hours as needed for wheezing or shortness of breath. 10/21/23   Sallyanne Kuster, NP  amLODipine (NORVASC) 5 MG tablet Take one tab po at bedtime for blood pressure 01/06/24   Abernathy, Arlyss Repress, NP  benzonatate (TESSALON) 200 MG capsule Take 1 capsule (200 mg total) by mouth 2 (two) times daily as needed for cough. 10/21/23   Sallyanne Kuster, NP  Blood Glucose Monitoring Suppl (ACCU-CHEK GUIDE) w/Device KIT Use as directed DXE11.65 05/11/22   Lyndon Code, MD  Continuous Glucose Sensor (DEXCOM G7 SENSOR) MISC Use one every 10 days for uncontrolled dm 08/06/23   Lyndon Code, MD  dapagliflozin propanediol (FARXIGA) 10 MG TABS tablet Take 1 tablet (10 mg total) by mouth daily before breakfast. 01/06/24   Sallyanne Kuster, NP  ezetimibe (ZETIA) 10 MG tablet Take 10 mg by mouth daily.    [provider]  glucose blood (ACCU-CHEK GUIDE) test strip Use as directed to check blood sugars twice a day  E11.65 04/10/23   Sallyanne Kuster, NP  mupirocin ointment (BACTROBAN) 2 % Apply 1 Application topically 2 (two) times daily. To affected area until  healed. 12/31/22   Sallyanne Kuster, NP  ondansetron (ZOFRAN) 4 MG tablet Take 1 tablet (4 mg total) by mouth every 8 (eight) hours as needed for nausea or vomiting. 09/15/23 09/14/24  Janith Lima, MD  rosuvastatin (CRESTOR) 10 MG tablet Take 1 tablet (10 mg total) by mouth daily. 08/07/23   Lyndon Code, MD  tirzepatide [facility]) 5 MG/0.5ML Pen Inject 5 mg into the skin once a week. 01/06/24   Sallyanne Kuster, NP  TRESIBA FLEXTOUCH 100 UNIT/ML FlexTouch Pen Inject 10-15 Units into the skin daily. If glucose is elevated 01/06/24   Sallyanne Kuster, NP  triamcinolone ointment (KENALOG) 0.5 % Apply 1 Application topically 2 (two) times daily. 11/28/22   Sallyanne Kuster, NP  triamterene-hydrochlorothiazide (MAXZIDE-25) 37.5-25 MG tablet Take 1 tablet by mouth daily. 08/07/23   Lyndon Code, MD  Vitamin D, Ergocalciferol, (DRISDOL) 1.25 MG (50000 UNIT) CAPS capsule Take 1 capsule (50,000 Units total) by mouth every 7 (seven) days. 12/31/22   Sallyanne Kuster, NP    Allergies as of 10/11/2023 - Review Complete 10/10/2023  Allergen Reaction Noted   Benazepril Swelling 08/19/2023   Wax [beeswax] Itching and Swelling 11/27/2021    Family History  Problem Relation Age of Onset   Renal Disease Father    Kidney failure Father    Diabetes Father     Social History   Socioeconomic History   Marital status: Single    Spouse name: Not  on file   Number of children: Not on file   Years of education: Not on file   Highest education level: Not on file  Occupational History   Not on file  Tobacco Use   Smoking status: Every Day    Types: Cigarettes   Smokeless tobacco: Never   Tobacco comments:    6 cigarettes daily  Substance and Sexual Activity   Alcohol use: Not Currently   Drug use: Never   Sexual activity: Not on file  Other Topics Concern   Not on file  Social History Narrative   Not on file   Social Drivers of Health   Financial Resource Strain: Not on file  Food Insecurity:  Not on file  Transportation Needs: Not on file  Physical Activity: Not on file  Stress: Not on file  Social Connections: Not on file  Intimate Partner Violence: Not on file    Review of Systems: See HPI, otherwise negative ROS  Physical Exam: There were no vitals taken for this visit. General:   Alert,  pleasant and cooperative in NAD Head:  Normocephalic and atraumatic. Neck:  Supple; no masses or thyromegaly. Lungs:  Clear throughout to auscultation, normal respiratory effort.    Heart:  +S1, +S2, Regular rate and rhythm, No edema. Abdomen:  Soft, nontender and nondistended. Normal bowel sounds, without guarding, and without rebound.   Neurologic:  Alert and  oriented x4;  grossly normal neurologically.  Impression/Plan: Tammy Beasley is here for an colonoscopy to be performed for Screening colonoscopy average risk   Risks, benefits, limitations, and alternatives regarding  colonoscopy have been reviewed with the patient.  Questions have been answered.  All parties agreeable.   Luke Salaam, MD  01/13/2024, 8:28 AM \

## 2024-01-14 ENCOUNTER — Encounter: Payer: Self-pay | Admitting: Gastroenterology

## 2024-01-14 LAB — SURGICAL PATHOLOGY

## 2024-02-03 ENCOUNTER — Ambulatory Visit: Admitting: Nurse Practitioner

## 2024-02-16 ENCOUNTER — Encounter: Payer: Self-pay | Admitting: Nurse Practitioner

## 2024-02-20 ENCOUNTER — Telehealth: Payer: Self-pay | Admitting: Nurse Practitioner

## 2024-02-20 NOTE — Telephone Encounter (Signed)
 Lvm and sent message to r/s 02/03/24 cancelled appointment-Toni

## 2024-03-05 ENCOUNTER — Telehealth: Payer: Self-pay | Admitting: Nurse Practitioner

## 2024-03-05 NOTE — Telephone Encounter (Signed)
 Lvm to schedule October follow up appointment-Toni

## 2024-07-09 ENCOUNTER — Ambulatory Visit: Admitting: Nurse Practitioner

## 2024-07-14 ENCOUNTER — Telehealth: Payer: Self-pay | Admitting: Nurse Practitioner

## 2024-07-14 NOTE — Telephone Encounter (Signed)
 lvm to move appt 07/16/24 up earlier & if okay to see dfk-Toni

## 2024-07-16 ENCOUNTER — Ambulatory Visit: Admitting: Nurse Practitioner

## 2024-07-22 ENCOUNTER — Ambulatory Visit (INDEPENDENT_AMBULATORY_CARE_PROVIDER_SITE_OTHER): Admitting: Nurse Practitioner

## 2024-07-22 ENCOUNTER — Encounter: Payer: Self-pay | Admitting: Nurse Practitioner

## 2024-07-22 VITALS — BP 135/85 | HR 72 | Temp 96.2°F | Resp 16 | Ht 64.0 in | Wt 221.4 lb

## 2024-07-22 DIAGNOSIS — E1169 Type 2 diabetes mellitus with other specified complication: Secondary | ICD-10-CM | POA: Diagnosis not present

## 2024-07-22 DIAGNOSIS — I152 Hypertension secondary to endocrine disorders: Secondary | ICD-10-CM

## 2024-07-22 DIAGNOSIS — E1159 Type 2 diabetes mellitus with other circulatory complications: Secondary | ICD-10-CM

## 2024-07-22 DIAGNOSIS — E559 Vitamin D deficiency, unspecified: Secondary | ICD-10-CM

## 2024-07-22 DIAGNOSIS — E782 Mixed hyperlipidemia: Secondary | ICD-10-CM

## 2024-07-22 DIAGNOSIS — J0100 Acute maxillary sinusitis, unspecified: Secondary | ICD-10-CM

## 2024-07-22 DIAGNOSIS — Z794 Long term (current) use of insulin: Secondary | ICD-10-CM

## 2024-07-22 DIAGNOSIS — E785 Hyperlipidemia, unspecified: Secondary | ICD-10-CM

## 2024-07-22 LAB — POCT GLYCOSYLATED HEMOGLOBIN (HGB A1C): Hemoglobin A1C: 9 % — AB (ref 4.0–5.6)

## 2024-07-22 MED ORDER — DEXCOM G7 SENSOR MISC: 11 refills | Status: AC

## 2024-07-22 MED ORDER — AZITHROMYCIN 250 MG PO TABS: ORAL_TABLET | ORAL | 0 refills | Status: AC

## 2024-07-22 MED ORDER — BENZONATATE 200 MG PO CAPS
200.0000 mg | ORAL_CAPSULE | Freq: Two times a day (BID) | ORAL | 0 refills | Status: DC | PRN
Start: 2024-07-22 — End: 2024-08-12

## 2024-07-22 MED ORDER — DAPAGLIFLOZIN PROPANEDIOL 10 MG PO TABS: 10.0000 mg | ORAL_TABLET | Freq: Every day | ORAL | 5 refills | Status: AC

## 2024-07-22 MED ORDER — TIRZEPATIDE 7.5 MG/0.5ML ~~LOC~~ SOAJ: 7.5000 mg | SUBCUTANEOUS | 5 refills | Status: AC

## 2024-07-22 MED ORDER — ALBUTEROL SULFATE HFA 108 (90 BASE) MCG/ACT IN AERS: 2.0000 | INHALATION_SPRAY | Freq: Four times a day (QID) | RESPIRATORY_TRACT | 5 refills | Status: AC | PRN

## 2024-07-22 MED ORDER — AMLODIPINE BESYLATE 5 MG PO TABS: ORAL_TABLET | ORAL | 1 refills | Status: AC

## 2024-07-22 MED ORDER — ROSUVASTATIN CALCIUM 10 MG PO TABS
10.0000 mg | ORAL_TABLET | Freq: Every day | ORAL | 3 refills | Status: AC
Start: 2024-07-22 — End: ?

## 2024-07-22 MED ORDER — TRESIBA FLEXTOUCH 100 UNIT/ML ~~LOC~~ SOPN
10.0000 [IU] | PEN_INJECTOR | Freq: Every day | SUBCUTANEOUS | 0 refills | Status: AC
Start: 2024-07-22 — End: ?

## 2024-07-22 NOTE — Progress Notes (Unsigned)
 [facility] [address] [place], [place] [address]  Internal MEDICINE  Office Visit Note  Patient Name: Tammy Beasley  [id number]  [id number]  Date of Service: 07/22/2024  Chief Complaint  Patient presents with   Diabetes   Hypertension   Follow-up    HPI Kista presents for a follow-up visit for diabetes, hypertension, and high cholesterol.  Diabetes -- she reports that her glucose level has improved with her being back on mounjaro . She has not been able to start farxiga  yet. They need prior authorization.  Hypertension -- controlled on amlodipine  High cholesterol -- taking rosuvastatin  and zetia  daily.   The 10-year ASCVD risk score (Arnett DK, et al., 2019) is: 35.3%   Values used to calculate the score:     Age: 53 years     Clincally relevant sex: Female     Is Non-Hispanic African American: Yes     Diabetic: Yes     Tobacco smoker: Yes     Systolic Blood Pressure: 135 mmHg     Is BP treated: Yes     HDL Cholesterol: 35 mg/dL     Total Cholesterol: 196 mg/dL   Fibrosis 4 Score = .69 (Low risk)      Interpretation for patients with NAFLD          <1.30       -  F0-F1 (Low risk)          1.30-2.67 -  Indeterminate           >2.67      -  F3-F4 (High risk)     Validated for ages 75-65         Current Medication: Outpatient Encounter Medications as of 07/22/2024  Medication Sig   azithromycin (ZITHROMAX) 250 MG tablet Take 2 tablets on day 1, then 1 tablet daily on days 2 through 5   Continuous Glucose Sensor (DEXCOM G7 SENSOR) MISC Apply 1 sensor every 10 days to check glucose via CGM for diabetes   tirzepatide  (MOUNJARO ) 7.5 MG/0.5ML Pen Inject 7.5 mg into the skin once a week.   Accu-Chek Softclix Lancets lancets Use as instructed to check blood sugars twice a day  E11.65   albuterol  (VENTOLIN  HFA) 108 (90 Base) MCG/ACT inhaler Inhale 2 puffs into the lungs every 6 (six) hours as needed for wheezing or shortness of breath.   amLODipine   (NORVASC ) 5 MG tablet Take one tab po at bedtime for blood pressure   benzonatate  (TESSALON ) 200 MG capsule Take 1 capsule (200 mg total) by mouth 2 (two) times daily as needed for cough.   Blood Glucose Monitoring Suppl (ACCU-CHEK GUIDE) w/Device KIT Use as directed DXE11.65   dapagliflozin  propanediol (FARXIGA ) 10 MG TABS tablet Take 1 tablet (10 mg total) by mouth daily before breakfast.   ezetimibe  (ZETIA ) 10 MG tablet Take 10 mg by mouth daily. (Patient not taking: Reported on 07/22/2024)   glucose blood (ACCU-CHEK GUIDE) test strip Use as directed to check blood sugars twice a day  E11.65   ondansetron  (ZOFRAN ) 4 MG tablet Take 1 tablet (4 mg total) by mouth every 8 (eight) hours as needed for nausea or vomiting. (Patient not taking: Reported on 07/22/2024)   rosuvastatin  (CRESTOR ) 10 MG tablet Take 1 tablet (10 mg total) by mouth daily.   TRESIBA  FLEXTOUCH 100 UNIT/ML FlexTouch Pen Inject 10-15 Units into the skin daily. If glucose is elevated   triamcinolone  ointment (KENALOG ) 0.5 % Apply 1 Application topically 2 (two) times  daily.   [DISCONTINUED] albuterol  (VENTOLIN  HFA) 108 (90 Base) MCG/ACT inhaler Inhale 2 puffs into the lungs every 6 (six) hours as needed for wheezing or shortness of breath.   [DISCONTINUED] amLODipine  (NORVASC ) 5 MG tablet Take one tab po at bedtime for blood pressure   [DISCONTINUED] benzonatate  (TESSALON ) 200 MG capsule Take 1 capsule (200 mg total) by mouth 2 (two) times daily as needed for cough. (Patient not taking: Reported on 07/22/2024)   [DISCONTINUED] Continuous Glucose Sensor (DEXCOM G7 SENSOR) MISC Use one every 10 days for uncontrolled dm   [DISCONTINUED] dapagliflozin  propanediol (FARXIGA ) 10 MG TABS tablet Take 1 tablet (10 mg total) by mouth daily before breakfast.   [DISCONTINUED] mupirocin  ointment (BACTROBAN ) 2 % Apply 1 Application topically 2 (two) times daily. To affected area until healed. (Patient not taking: Reported on 07/22/2024)    [DISCONTINUED] rosuvastatin  (CRESTOR ) 10 MG tablet Take 1 tablet (10 mg total) by mouth daily.   [DISCONTINUED] tirzepatide  (MOUNJARO ) 5 MG/0.5ML Pen Inject 5 mg into the skin once a week.   [DISCONTINUED] TRESIBA  FLEXTOUCH 100 UNIT/ML FlexTouch Pen Inject 10-15 Units into the skin daily. If glucose is elevated   [DISCONTINUED] triamterene -hydrochlorothiazide  (MAXZIDE-25) 37.5-25 MG tablet Take 1 tablet by mouth daily. (Patient not taking: Reported on 07/22/2024)   [DISCONTINUED] Vitamin D , Ergocalciferol , (DRISDOL ) 1.25 MG (50000 UNIT) CAPS capsule Take 1 capsule (50,000 Units total) by mouth every 7 (seven) days.   No facility-administered encounter medications on file as of 07/22/2024.    Surgical History: Past Surgical History:  Procedure Laterality Date   BREAST LUMPECTOMY     celluilitus     COLONOSCOPY WITH PROPOFOL  N/A 01/13/2024   Procedure: COLONOSCOPY WITH PROPOFOL ;  Surgeon: Therisa Bi, MD;  Location: [facility] ENDOSCOPY;  Service: Gastroenterology;  Laterality: N/A;   POLYPECTOMY  01/13/2024   Procedure: POLYPECTOMY, INTESTINE;  Surgeon: Therisa Bi, MD;  Location: [facility] ENDOSCOPY;  Service: Gastroenterology;;    Medical History: Past Medical History:  Diagnosis Date   Diabetes mellitus without complication (HCC)    Hypertension     Family History: Family History  Problem Relation Age of Onset   Renal Disease Father    Kidney failure Father    Diabetes Father     Social History   Socioeconomic History   Marital status: Single    Spouse name: Not on file   Number of children: Not on file   Years of education: Not on file   Highest education level: Not on file  Occupational History   Not on file  Tobacco Use   Smoking status: Every Day    Types: Cigarettes   Smokeless tobacco: Never   Tobacco comments:    6 cigarettes daily  Vaping Use   Vaping status: Never Used  Substance and Sexual Activity   Alcohol use: Not Currently   Drug use: Never   Sexual  activity: Not on file  Other Topics Concern   Not on file  Social History Narrative   Not on file   Social Drivers of Health   Financial Resource Strain: Not on file  Food Insecurity: Not on file  Transportation Needs: Not on file  Physical Activity: Not on file  Stress: Not on file  Social Connections: Not on file  Intimate Partner Violence: Not on file      Review of Systems  Constitutional:  Positive for appetite change, chills, fatigue and fever.  HENT:  Positive for congestion, postnasal drip and sore throat. Negative for rhinorrhea, sinus pressure, sinus  pain and sneezing.   Respiratory:  Positive for cough. Negative for chest tightness, shortness of breath and wheezing.   Cardiovascular: Negative.  Negative for chest pain and palpitations.  Gastrointestinal: Negative.  Negative for diarrhea, nausea and vomiting.  Genitourinary: Negative.   Musculoskeletal:  Positive for myalgias (body aches).  Neurological:  Positive for headaches.    Vital Signs: BP (!) 140/88   Pulse 72   Temp (!) 96.2 F (35.7 C)   Resp 16   Ht 5' 4 (1.626 m)   Wt 221 lb 6.4 oz (100.4 kg)   SpO2 99%   BMI 38.00 kg/m    Physical Exam Vitals reviewed.  Constitutional:      General: She is not in acute distress.    Appearance: Normal appearance. She is obese. She is ill-appearing.  HENT:     Head: Normocephalic and atraumatic.     Right Ear: Tympanic membrane, ear canal and external ear normal.     Left Ear: Tympanic membrane, ear canal and external ear normal.     Nose: Congestion present. No rhinorrhea.     Mouth/Throat:     Mouth: Mucous membranes are moist.     Pharynx: Posterior oropharyngeal erythema present.  Eyes:     Pupils: Pupils are equal, round, and reactive to light.  Cardiovascular:     Rate and Rhythm: Normal rate and regular rhythm.     Heart sounds: Normal heart sounds. No murmur heard.    No friction rub. No gallop.  Pulmonary:     Effort: Pulmonary effort is  normal. No accessory muscle usage or respiratory distress.     Breath sounds: Normal breath sounds. No wheezing.  Neurological:     Mental Status: She is alert and oriented to person, place, and time.  Psychiatric:        Mood and Affect: Mood normal.        Behavior: Behavior normal.        Assessment/Plan: 1. Acute non-recurrent maxillary sinusitis (Primary) *** - azithromycin (ZITHROMAX) 250 MG tablet; Take 2 tablets on day 1, then 1 tablet daily on days 2 through 5  Dispense: 6 tablet; Refill: 0 - albuterol  (VENTOLIN  HFA) 108 (90 Base) MCG/ACT inhaler; Inhale 2 puffs into the lungs every 6 (six) hours as needed for wheezing or shortness of breath.  Dispense: 8 g; Refill: 5  2. Type 2 diabetes mellitus with other specified complication, with long-term current use of insulin  (HCC) *** - POCT glycosylated hemoglobin (Hb A1C) - tirzepatide  (MOUNJARO ) 7.5 MG/0.5ML Pen; Inject 7.5 mg into the skin once a week.  Dispense: 2 mL; Refill: 5 - TRESIBA  FLEXTOUCH 100 UNIT/ML FlexTouch Pen; Inject 10-15 Units into the skin daily. If glucose is elevated  Dispense: 15 mL; Refill: 0 - dapagliflozin  propanediol (FARXIGA ) 10 MG TABS tablet; Take 1 tablet (10 mg total) by mouth daily before breakfast.  Dispense: 30 tablet; Refill: 5 - CBC with Differential/Platelet - CMP14+EGFR - Lipid Profile - TSH + free T4 - Vitamin D  (25 hydroxy) - Continuous Glucose Sensor (DEXCOM G7 SENSOR) MISC; Apply 1 sensor every 10 days to check glucose via CGM for diabetes  Dispense: 3 each; Refill: 11  3. Hypertension associated with type 2 diabetes mellitus (HCC) *** - amLODipine  (NORVASC ) 5 MG tablet; Take one tab po at bedtime for blood pressure  Dispense: 90 tablet; Refill: 1 - CBC with Differential/Platelet - CMP14+EGFR - Lipid Profile - TSH + free T4  4. Hyperlipidemia associated with type 2 diabetes  mellitus (HCC) *** - rosuvastatin  (CRESTOR ) 10 MG tablet; Take 1 tablet (10 mg total) by mouth daily.   Dispense: 90 tablet; Refill: 3 - CBC with Differential/Platelet - CMP14+EGFR - Lipid Profile - TSH + free T4  5. Vitamin D  deficiency *** - Vitamin D  (25 hydroxy)   General Counseling: Makenze verbalizes understanding of the findings of todays visit and agrees with plan of treatment. I have discussed any further diagnostic evaluation that may be needed or ordered today. We also reviewed her medications today. she has been encouraged to call the office with any questions or concerns that should arise related to todays visit.    Orders Placed This Encounter  Procedures   CBC with Differential/Platelet   CMP14+EGFR   Lipid Profile   TSH + free T4   Vitamin D  (25 hydroxy)   POCT glycosylated hemoglobin (Hb A1C)    Meds ordered this encounter  Medications   tirzepatide  (MOUNJARO ) 7.5 MG/0.5ML Pen    Sig: Inject 7.5 mg into the skin once a week.    Dispense:  2 mL    Refill:  5    Fill new script today   TRESIBA  FLEXTOUCH 100 UNIT/ML FlexTouch Pen    Sig: Inject 10-15 Units into the skin daily. If glucose is elevated    Dispense:  15 mL    Refill:  0    Dx code E11.65   amLODipine  (NORVASC ) 5 MG tablet    Sig: Take one tab po at bedtime for blood pressure    Dispense:  90 tablet    Refill:  1   azithromycin (ZITHROMAX) 250 MG tablet    Sig: Take 2 tablets on day 1, then 1 tablet daily on days 2 through 5    Dispense:  6 tablet    Refill:  0   benzonatate  (TESSALON ) 200 MG capsule    Sig: Take 1 capsule (200 mg total) by mouth 2 (two) times daily as needed for cough.    Dispense:  30 capsule    Refill:  0    Fill new script today   dapagliflozin  propanediol (FARXIGA ) 10 MG TABS tablet    Sig: Take 1 tablet (10 mg total) by mouth daily before breakfast.    Dispense:  30 tablet    Refill:  5    Please put refills on file   albuterol  (VENTOLIN  HFA) 108 (90 Base) MCG/ACT inhaler    Sig: Inhale 2 puffs into the lungs every 6 (six) hours as needed for wheezing or shortness  of breath.    Dispense:  8 g    Refill:  5   rosuvastatin  (CRESTOR ) 10 MG tablet    Sig: Take 1 tablet (10 mg total) by mouth daily.    Dispense:  90 tablet    Refill:  3   Continuous Glucose Sensor (DEXCOM G7 SENSOR) MISC    Sig: Apply 1 sensor every 10 days to check glucose via CGM for diabetes    Dispense:  3 each    Refill:  11    Dx code E11.65    Return in about 3 weeks (around 08/12/2024) for F/U, Labs, eval new med, Rahkeem Senft PCP.   Total time spent:30 Minutes Time spent includes review of chart, medications, test results, and follow up plan with the patient.   [place] Controlled Substance Database was reviewed by me.  This patient was seen by Mardy Maxin, FNP-C in collaboration with Dr. Sigrid Bathe as a part of collaborative care agreement.  Sadi Arave R. Liana, MSN, FNP-C Internal medicine

## 2024-07-30 ENCOUNTER — Telehealth: Payer: Self-pay

## 2024-07-30 NOTE — Telephone Encounter (Signed)
 Pt called that her farxiga  copay  dollar advised her used  copay card and if still expensive then call us  back

## 2024-08-12 ENCOUNTER — Encounter: Payer: Self-pay | Admitting: Nurse Practitioner

## 2024-08-12 ENCOUNTER — Ambulatory Visit (INDEPENDENT_AMBULATORY_CARE_PROVIDER_SITE_OTHER): Admitting: Nurse Practitioner

## 2024-08-12 VITALS — BP 135/80 | HR 80 | Temp 96.9°F | Resp 16 | Ht 64.0 in | Wt 216.4 lb

## 2024-08-12 DIAGNOSIS — E1169 Type 2 diabetes mellitus with other specified complication: Secondary | ICD-10-CM | POA: Diagnosis not present

## 2024-08-12 DIAGNOSIS — Z794 Long term (current) use of insulin: Secondary | ICD-10-CM | POA: Diagnosis not present

## 2024-08-12 DIAGNOSIS — E1159 Type 2 diabetes mellitus with other circulatory complications: Secondary | ICD-10-CM | POA: Diagnosis not present

## 2024-08-12 DIAGNOSIS — E785 Hyperlipidemia, unspecified: Secondary | ICD-10-CM

## 2024-08-12 DIAGNOSIS — D751 Secondary polycythemia: Secondary | ICD-10-CM | POA: Diagnosis not present

## 2024-08-12 DIAGNOSIS — I152 Hypertension secondary to endocrine disorders: Secondary | ICD-10-CM

## 2024-08-12 LAB — CMP14+EGFR
ALT: 17 IU/L (ref 0–32)
AST: 14 IU/L (ref 0–40)
Albumin: 4.2 g/dL (ref 3.8–4.9)
Alkaline Phosphatase: 112 IU/L (ref 49–135)
BUN/Creatinine Ratio: 14 (ref 9–23)
BUN: 10 mg/dL (ref 6–24)
Bilirubin Total: 0.6 mg/dL (ref 0.0–1.2)
CO2: 25 mmol/L (ref 20–29)
Calcium: 9.9 mg/dL (ref 8.7–10.2)
Chloride: 106 mmol/L (ref 96–106)
Creatinine, Ser: 0.73 mg/dL (ref 0.57–1.00)
Globulin, Total: 3.3 g/dL (ref 1.5–4.5)
Glucose: 140 mg/dL — ABNORMAL HIGH (ref 70–99)
Potassium: 5.1 mmol/L (ref 3.5–5.2)
Sodium: 143 mmol/L (ref 134–144)
Total Protein: 7.5 g/dL (ref 6.0–8.5)
eGFR: 98 mL/min/1.73 (ref >59)

## 2024-08-12 LAB — CBC WITH DIFFERENTIAL/PLATELET
Basophils Absolute: 0.1 x10E3/uL (ref 0.0–0.2)
Basos: 1 %
EOS (ABSOLUTE): 0.4 x10E3/uL (ref 0.0–0.4)
Eos: 6 %
Hematocrit: 45 % (ref 34.0–46.6)
Hemoglobin: 14.3 g/dL (ref 11.1–15.9)
Immature Grans (Abs): 0 x10E3/uL (ref 0.0–0.1)
Immature Granulocytes: 0 %
Lymphocytes Absolute: 3 x10E3/uL (ref 0.7–3.1)
Lymphs: 48 %
MCH: 26.8 pg (ref 26.6–33.0)
MCHC: 31.8 g/dL (ref 31.5–35.7)
MCV: 84 fL (ref 79–97)
Monocytes Absolute: 0.5 x10E3/uL (ref 0.1–0.9)
Monocytes: 7 %
Neutrophils Absolute: 2.4 x10E3/uL (ref 1.4–7.0)
Neutrophils: 38 %
Platelets: 267 x10E3/uL (ref 150–450)
RBC: 5.34 x10E6/uL — ABNORMAL HIGH (ref 3.77–5.28)
RDW: 12.4 % (ref 11.7–15.4)
WBC: 6.3 x10E3/uL (ref 3.4–10.8)

## 2024-08-12 LAB — LIPID PANEL
Chol/HDL Ratio: 5.6 ratio — ABNORMAL HIGH (ref 0.0–4.4)
Cholesterol, Total: 196 mg/dL (ref 100–199)
HDL: 35 mg/dL — ABNORMAL LOW (ref >39)
LDL Chol Calc (NIH): 143 mg/dL — ABNORMAL HIGH (ref 0–99)
Triglycerides: 99 mg/dL (ref 0–149)
VLDL Cholesterol Cal: 18 mg/dL (ref 5–40)

## 2024-08-12 LAB — TSH+FREE T4
Free T4: 1.04 ng/dL (ref 0.82–1.77)
TSH: 1.03 u[IU]/mL (ref 0.450–4.500)

## 2024-08-12 LAB — VITAMIN D 25 HYDROXY (VIT D DEFICIENCY, FRACTURES): Vit D, 25-Hydroxy: 20.6 ng/mL — ABNORMAL LOW (ref 30.0–100.0)

## 2024-08-12 MED ORDER — ONDANSETRON HCL 4 MG PO TABS: 4.0000 mg | ORAL_TABLET | Freq: Three times a day (TID) | ORAL | 5 refills | Status: AC | PRN

## 2024-08-12 MED ORDER — ACCU-CHEK SOFTCLIX LANCETS MISC: 12 refills | Status: AC

## 2024-08-12 MED ORDER — ACCU-CHEK GUIDE TEST VI STRP: ORAL_STRIP | 12 refills | Status: AC

## 2024-08-12 NOTE — Progress Notes (Signed)
 [facility] [address] [place], [place] [address]  Internal MEDICINE  Office Visit Note  Patient Name: Tammy Beasley  [id number]  [id number]  Date of Service: 08/12/2024  Chief Complaint  Patient presents with   Diabetes   Hypertension   Follow-up    HPI Tammy Beasley presents for a follow-up visit for lab results and diabetes  Diabetes -- A1c is still elevated, but her fasting glucose is below 150 most mornings. She is tolerating the increased dose of mounjaro , just a mild nausea sometimes. She has not been able to restart the farxiga  due to the cost but the pharmacy has a copay card for her.  Low vitamin D  20.6 High cholesterol -- LDL 143, low HDL 35 Elevated RBC  -- has not been evaluated by hematology yet     Current Medication: Outpatient Encounter Medications as of 08/12/2024  Medication Sig   glucose blood (ACCU-CHEK GUIDE TEST) test strip Use 1 test strip to check glucose once daily and as needed. For diabetes E11.65   Accu-Chek Softclix Lancets lancets Use as instructed to check blood sugars twice a day  E11.65   albuterol  (VENTOLIN  HFA) 108 (90 Base) MCG/ACT inhaler Inhale 2 puffs into the lungs every 6 (six) hours as needed for wheezing or shortness of breath.   amLODipine  (NORVASC ) 5 MG tablet Take one tab po at bedtime for blood pressure   Blood Glucose Monitoring Suppl (ACCU-CHEK GUIDE) w/Device KIT Use as directed DXE11.65   Continuous Glucose Sensor (DEXCOM G7 SENSOR) MISC Apply 1 sensor every 10 days to check glucose via CGM for diabetes   dapagliflozin  propanediol (FARXIGA ) 10 MG TABS tablet Take 1 tablet (10 mg total) by mouth daily before breakfast.   ezetimibe  (ZETIA ) 10 MG tablet Take 10 mg by mouth daily. (Patient not taking: Reported on 07/22/2024)   ondansetron  (ZOFRAN ) 4 MG tablet Take 1 tablet (4 mg total) by mouth every 8 (eight) hours as needed for nausea or vomiting.   rosuvastatin  (CRESTOR ) 10 MG tablet Take 1 tablet (10 mg total) by  mouth daily.   tirzepatide  (MOUNJARO ) 7.5 MG/0.5ML Pen Inject 7.5 mg into the skin once a week.   TRESIBA  FLEXTOUCH 100 UNIT/ML FlexTouch Pen Inject 10-15 Units into the skin daily. If glucose is elevated   triamcinolone  ointment (KENALOG ) 0.5 % Apply 1 Application topically 2 (two) times daily.   [DISCONTINUED] Accu-Chek Softclix Lancets lancets Use as instructed to check blood sugars twice a day  E11.65   [DISCONTINUED] benzonatate  (TESSALON ) 200 MG capsule Take 1 capsule (200 mg total) by mouth 2 (two) times daily as needed for cough.   [DISCONTINUED] glucose blood (ACCU-CHEK GUIDE) test strip Use as directed to check blood sugars twice a day  E11.65   [DISCONTINUED] ondansetron  (ZOFRAN ) 4 MG tablet Take 1 tablet (4 mg total) by mouth every 8 (eight) hours as needed for nausea or vomiting. (Patient not taking: Reported on 07/22/2024)   No facility-administered encounter medications on file as of 08/12/2024.    Surgical History: Past Surgical History:  Procedure Laterality Date   BREAST LUMPECTOMY     celluilitus     COLONOSCOPY WITH PROPOFOL  N/A 01/13/2024   Procedure: COLONOSCOPY WITH PROPOFOL ;  Surgeon: Therisa Bi, MD;  Location: [facility] ENDOSCOPY;  Service: Gastroenterology;  Laterality: N/A;   POLYPECTOMY  01/13/2024   Procedure: POLYPECTOMY, INTESTINE;  Surgeon: Therisa Bi, MD;  Location: [facility] ENDOSCOPY;  Service: Gastroenterology;;    Medical History: Past Medical History:  Diagnosis Date   Diabetes  mellitus without complication (HCC)    Hypertension     Family History: Family History  Problem Relation Age of Onset   Renal Disease Father    Kidney failure Father    Diabetes Father     Social History   Socioeconomic History   Marital status: Single    Spouse name: Not on file   Number of children: Not on file   Years of education: Not on file   Highest education level: Not on file  Occupational History   Not on file  Tobacco Use   Smoking status: Every Day     Types: Cigarettes   Smokeless tobacco: Never   Tobacco comments:    6 cigarettes daily  Vaping Use   Vaping status: Never Used  Substance and Sexual Activity   Alcohol use: Not Currently   Drug use: Never   Sexual activity: Not on file  Other Topics Concern   Not on file  Social History Narrative   Not on file   Social Drivers of Health   Financial Resource Strain: Not on file  Food Insecurity: Not on file  Transportation Needs: Not on file  Physical Activity: Not on file  Stress: Not on file  Social Connections: Not on file  Intimate Partner Violence: Not on file      Review of Systems  Constitutional:  Negative for fatigue and fever.  HENT:  Negative for congestion, mouth sores and postnasal drip.   Respiratory:  Negative for cough.   Cardiovascular:  Negative for chest pain.  Gastrointestinal:  Negative for abdominal pain.  Genitourinary:  Negative for flank pain.  Musculoskeletal:  Negative for back pain.  Neurological:  Negative for headaches.  Psychiatric/Behavioral: Negative.  Negative for behavioral problems and suicidal ideas.     Vital Signs: BP 135/80   Pulse 80   Temp (!) 96.9 F (36.1 C)   Resp 16   Ht 5' 4 (1.626 m)   Wt 216 lb 6.4 oz (98.2 kg)   SpO2 98%   BMI 37.14 kg/m    Physical Exam Vitals reviewed.  Constitutional:      General: She is not in acute distress.    Appearance: Normal appearance. She is obese. She is not ill-appearing.  HENT:     Head: Normocephalic and atraumatic.  Eyes:     Pupils: Pupils are equal, round, and reactive to light.  Cardiovascular:     Rate and Rhythm: Normal rate and regular rhythm.  Pulmonary:     Effort: Pulmonary effort is normal. No respiratory distress.  Neurological:     Mental Status: She is alert and oriented to person, place, and time.  Psychiatric:        Mood and Affect: Mood normal.        Behavior: Behavior normal.        Assessment/Plan: 1. Type 2 diabetes mellitus with other  specified complication, with long-term current use of insulin  (HCC) Continue mounjaro , farxiga  and tresiba  as prescribed. Continue to check glucose daily and as needed via glucose meter or dexcom.  - Accu-Chek Softclix Lancets lancets; Use as instructed to check blood sugars twice a day  E11.65  Dispense: 100 each; Refill: 12 - glucose blood (ACCU-CHEK GUIDE TEST) test strip; Use 1 test strip to check glucose once daily and as needed. For diabetes E11.65  Dispense: 100 each; Refill: 12 - ondansetron  (ZOFRAN ) 4 MG tablet; Take 1 tablet (4 mg total) by mouth every 8 (eight) hours as needed for  nausea or vomiting.  Dispense: 20 tablet; Refill: 5  2. Hypertension associated with type 2 diabetes mellitus (HCC) Continue amlodipine  as prescribed.   3. Hyperlipidemia associated with type 2 diabetes mellitus (HCC) Continue rosuvastatin  as prescribed.   4. Erythrocytosis (Primary) Referred to hematology for persistently elevated RBC  - Ambulatory referral to Hematology / Oncology   General Counseling: sayana salley understanding of the findings of todays visit and agrees with plan of treatment. I have discussed any further diagnostic evaluation that may be needed or ordered today. We also reviewed her medications today. she has been encouraged to call the office with any questions or concerns that should arise related to todays visit.    Orders Placed This Encounter  Procedures   Ambulatory referral to Hematology / Oncology    Meds ordered this encounter  Medications   Accu-Chek Softclix Lancets lancets    Sig: Use as instructed to check blood sugars twice a day  E11.65    Dispense:  100 each    Refill:  12   glucose blood (ACCU-CHEK GUIDE TEST) test strip    Sig: Use 1 test strip to check glucose once daily and as needed. For diabetes E11.65    Dispense:  100 each    Refill:  12    Fill new script today, dx code E11.65   ondansetron  (ZOFRAN ) 4 MG tablet    Sig: Take 1 tablet (4 mg  total) by mouth every 8 (eight) hours as needed for nausea or vomiting.    Dispense:  20 tablet    Refill:  5    Fill new script today    Return in about 2 months (around 10/12/2024) for F/U, Dorsey Authement PCP.   Total time spent:30 Minutes Time spent includes review of chart, medications, test results, and follow up plan with the patient.   [place] Controlled Substance Database was reviewed by me.  This patient was seen by Mardy Maxin, FNP-C in collaboration with Dr. Sigrid Bathe as a part of collaborative care agreement.   Mc Hollen R. Maxin, MSN, FNP-C Internal medicine

## 2024-08-17 ENCOUNTER — Encounter: Payer: Self-pay | Admitting: Nurse Practitioner

## 2024-08-17 DIAGNOSIS — E559 Vitamin D deficiency, unspecified: Secondary | ICD-10-CM | POA: Insufficient documentation

## 2024-08-18 ENCOUNTER — Encounter: Payer: Self-pay | Admitting: Nurse Practitioner

## 2024-08-24 ENCOUNTER — Encounter: Payer: Self-pay | Admitting: Oncology

## 2024-09-02 ENCOUNTER — Inpatient Hospital Stay: Attending: Oncology | Admitting: Oncology

## 2024-09-02 ENCOUNTER — Inpatient Hospital Stay

## 2024-09-02 ENCOUNTER — Encounter: Payer: Self-pay | Admitting: Oncology

## 2024-09-02 VITALS — BP 139/89 | HR 71 | Temp 97.6°F | Resp 18 | Ht 64.0 in | Wt 216.0 lb

## 2024-09-02 DIAGNOSIS — Z79899 Other long term (current) drug therapy: Secondary | ICD-10-CM | POA: Diagnosis not present

## 2024-09-02 DIAGNOSIS — Z809 Family history of malignant neoplasm, unspecified: Secondary | ICD-10-CM | POA: Insufficient documentation

## 2024-09-02 DIAGNOSIS — D751 Secondary polycythemia: Secondary | ICD-10-CM

## 2024-09-02 DIAGNOSIS — F17210 Nicotine dependence, cigarettes, uncomplicated: Secondary | ICD-10-CM | POA: Diagnosis not present

## 2024-09-02 LAB — CBC WITH DIFFERENTIAL/PLATELET
Abs Immature Granulocytes: 0.02 K/uL (ref 0.00–0.07)
Basophils Absolute: 0 K/uL (ref 0.0–0.1)
Basophils Relative: 1 %
Eosinophils Absolute: 0.3 K/uL (ref 0.0–0.5)
Eosinophils Relative: 4 %
HCT: 43.2 % (ref 36.0–46.0)
Hemoglobin: 14.1 g/dL (ref 12.0–15.0)
Immature Granulocytes: 0 %
Lymphocytes Relative: 45 %
Lymphs Abs: 3.1 K/uL (ref 0.7–4.0)
MCH: 27 pg (ref 26.0–34.0)
MCHC: 32.6 g/dL (ref 30.0–36.0)
MCV: 82.8 fL (ref 80.0–100.0)
Monocytes Absolute: 0.6 K/uL (ref 0.1–1.0)
Monocytes Relative: 8 %
Neutro Abs: 2.9 K/uL (ref 1.7–7.7)
Neutrophils Relative %: 42 %
Platelets: 224 K/uL (ref 150–400)
RBC: 5.22 MIL/uL — ABNORMAL HIGH (ref 3.87–5.11)
RDW: 13.4 % (ref 11.5–15.5)
WBC: 6.9 K/uL (ref 4.0–10.5)
nRBC: 0 % (ref 0.0–0.2)

## 2024-09-02 LAB — IRON AND TIBC
Iron: 82 ug/dL (ref 28–170)
Saturation Ratios: 22 % (ref 10.4–31.8)
TIBC: 374 ug/dL (ref 250–450)
UIBC: 292 ug/dL

## 2024-09-02 LAB — FERRITIN: Ferritin: 138 ng/mL (ref 11–307)

## 2024-09-02 NOTE — Progress Notes (Unsigned)
 [facility]  [phone] Fax:(336) [phone]  ID: Tammy Beasley Marc OB: Oct 27, 1970  MR#: [id number]  RDW#:[id number]  Patient Care Team: Liana Fish, NP as PCP - General (Nurse Practitioner) Fernand Sigrid HERO, MD (Internal Medicine) Jacobo Evalene PARAS, MD as Consulting Physician (Oncology)  CHIEF COMPLAINT: Erythrocytosis.  INTERVAL HISTORY: Patient is a 53 year old female who was noted to have a persistently elevated RBC level with a normal hemoglobin and hematocrit.  She currently feels well and is asymptomatic.  She has no neurologic complaints.  She denies any weakness or fatigue.  She has a good appetite and denies weight loss.  She has no chest pain, shortness of breath, cough, or hemoptysis.  She denies any nausea, vomiting, constipation, or diarrhea.  She has no urinary complaints.  Patient offers no specific complaints today.  REVIEW OF SYSTEMS:   Review of Systems  Constitutional: Negative.  Negative for fever, malaise/fatigue and weight loss.  Respiratory: Negative.  Negative for cough, hemoptysis and shortness of breath.   Cardiovascular: Negative.  Negative for chest pain and leg swelling.  Gastrointestinal: Negative.  Negative for abdominal pain.  Genitourinary: Negative.  Negative for dysuria.  Musculoskeletal: Negative.  Negative for back pain.  Skin: Negative.  Negative for rash.  Neurological: Negative.  Negative for dizziness, focal weakness, weakness and headaches.  Psychiatric/Behavioral: Negative.  The patient is not nervous/anxious.     As per HPI. Otherwise, a complete review of systems is negative.  PAST MEDICAL HISTORY: Past Medical History:  Diagnosis Date   Diabetes mellitus without complication (HCC)    Hypertension     PAST SURGICAL HISTORY: Past Surgical History:  Procedure Laterality Date   BREAST LUMPECTOMY     celluilitus     COLONOSCOPY WITH PROPOFOL  N/A 01/13/2024   Procedure: COLONOSCOPY WITH PROPOFOL ;   Surgeon: Therisa Bi, MD;  Location: [facility] ENDOSCOPY;  Service: Gastroenterology;  Laterality: N/A;   POLYPECTOMY  01/13/2024   Procedure: POLYPECTOMY, INTESTINE;  Surgeon: Therisa Bi, MD;  Location: [facility] ENDOSCOPY;  Service: Gastroenterology;;    FAMILY HISTORY: Family History  Problem Relation Age of Onset   Renal Disease Father    Kidney failure Father    Diabetes Father    Cancer Brother    Cancer Other     ADVANCED DIRECTIVES (Y/N):  N  HEALTH MAINTENANCE: Social History   Tobacco Use   Smoking status: Every Day    Types: Cigarettes   Smokeless tobacco: Never   Tobacco comments:    6 cigarettes daily  Vaping Use   Vaping status: Never Used  Substance Use Topics   Alcohol use: Not Currently   Drug use: Never     Colonoscopy:  PAP:  Bone density:  Lipid panel:  Allergies  Allergen Reactions   Benazepril  Swelling   Wax [Beeswax] Itching and Swelling    Current Outpatient Medications  Medication Sig Dispense Refill   Accu-Chek Softclix Lancets lancets Use as instructed to check blood sugars twice a day  E11.65 100 each 12   albuterol  (VENTOLIN  HFA) 108 (90 Base) MCG/ACT inhaler Inhale 2 puffs into the lungs every 6 (six) hours as needed for wheezing or shortness of breath. 8 g 5   amLODipine  (NORVASC ) 5 MG tablet Take one tab po at bedtime for blood pressure 90 tablet 1   Blood Glucose Monitoring Suppl (ACCU-CHEK GUIDE) w/Device KIT Use as directed DXE11.65 1 kit 0   cholecalciferol (VITAMIN D3) 25 MCG (1000 UNIT) tablet Take 1,000 Units by mouth daily.  Continuous Glucose Sensor (DEXCOM G7 SENSOR) MISC Apply 1 sensor every 10 days to check glucose via CGM for diabetes 3 each 11   dapagliflozin  propanediol (FARXIGA ) 10 MG TABS tablet Take 1 tablet (10 mg total) by mouth daily before breakfast. 30 tablet 5   ezetimibe  (ZETIA ) 10 MG tablet Take 10 mg by mouth daily.     glucose blood (ACCU-CHEK GUIDE TEST) test strip Use 1 test strip to check glucose once daily  and as needed. For diabetes E11.65 100 each 12   ondansetron  (ZOFRAN ) 4 MG tablet Take 1 tablet (4 mg total) by mouth every 8 (eight) hours as needed for nausea or vomiting. 20 tablet 5   rosuvastatin  (CRESTOR ) 10 MG tablet Take 1 tablet (10 mg total) by mouth daily. 90 tablet 3   tirzepatide  (MOUNJARO ) 7.5 MG/0.5ML Pen Inject 7.5 mg into the skin once a week. 2 mL 5   TRESIBA  FLEXTOUCH 100 UNIT/ML FlexTouch Pen Inject 10-15 Units into the skin daily. If glucose is elevated 15 mL 0   triamcinolone  ointment (KENALOG ) 0.5 % Apply 1 Application topically 2 (two) times daily. 45 g 1   No current facility-administered medications for this visit.    OBJECTIVE: Vitals:   09/02/24 1413  BP: 139/89  Pulse: 71  Resp: 18  Temp: 97.6 F (36.4 C)  SpO2: 98%     Body mass index is 37.08 kg/m.    ECOG FS:0 - Asymptomatic  General: Well-developed, well-nourished, no acute distress. Eyes: Pink conjunctiva, anicteric sclera. HEENT: Normocephalic, moist mucous membranes. Lungs: No audible wheezing or coughing. Heart: Regular rate and rhythm. Abdomen: Soft, nontender, no obvious distention. Musculoskeletal: No edema, cyanosis, or clubbing. Neuro: Alert, answering all questions appropriately. Cranial nerves grossly intact. Skin: No rashes or petechiae noted. Psych: Normal affect. Lymphatics: No cervical, calvicular, axillary or inguinal LAD.   LAB RESULTS:  Lab Results  Component Value Date   NA 143 08/11/2024   K 5.1 08/11/2024   CL 106 08/11/2024   CO2 25 08/11/2024   GLUCOSE 140 (H) 08/11/2024   BUN 10 08/11/2024   CREATININE 0.73 08/11/2024   CALCIUM  9.9 08/11/2024   PROT 7.5 08/11/2024   ALBUMIN 4.2 08/11/2024   AST 14 08/11/2024   ALT 17 08/11/2024   ALKPHOS 112 08/11/2024   BILITOT 0.6 08/11/2024   GFRNONAA >60 09/14/2023   GFRAA 96 11/25/2020    Lab Results  Component Value Date   WBC 6.9 09/02/2024   NEUTROABS 2.9 09/02/2024   HGB 14.1 09/02/2024   HCT 43.2  09/02/2024   MCV 82.8 09/02/2024   PLT 224 09/02/2024   Lab Results  Component Value Date   IRON 82 09/02/2024   TIBC 374 09/02/2024   IRONPCTSAT 22 09/02/2024   Lab Results  Component Value Date   FERRITIN 138 09/02/2024     STUDIES: No results found.  ASSESSMENT: Erythrocytosis.  PLAN:    Erythrocytosis: Elevated RBC level with normal hematocrit and hemoglobin is clinically insignificant.  Hemoglobin is 14.1 today.  Iron stores were drawn for completeness and they are also within normal limits.  No intervention is needed.  Patient does not require bone marrow biopsy.  No further follow-up has been scheduled.  I spent a total of 45 minutes reviewing chart data, face-to-face evaluation with the patient, counseling and coordination of care as detailed above.   Patient expressed understanding and was in agreement with this plan. She also understands that She can call clinic at any time with any questions,  concerns, or complaints.     Evalene JINNY Reusing, MD   09/03/2024 5:02 PM

## 2024-09-02 NOTE — Progress Notes (Unsigned)
 Patients brother has a high blood level. Genetic testing is something that she is wanting to see about having done.

## 2024-10-07 ENCOUNTER — Other Ambulatory Visit: Payer: Self-pay

## 2024-10-07 ENCOUNTER — Telehealth: Payer: Self-pay

## 2024-10-07 MED ORDER — OSELTAMIVIR PHOSPHATE 75 MG PO CAPS: 75.0000 mg | ORAL_CAPSULE | Freq: Two times a day (BID) | ORAL | 0 refills | Status: AC

## 2024-10-07 NOTE — Telephone Encounter (Signed)
 Pt called that is exposure to FLU and she is having symptoms start last night ,coughing,headache and scratchy throat as per dr fernand sent Tamiflu  for 5 days and advised her to take OTC cough med and also tylenol  as needed for fever and if symptom getting worse go to Urgent care

## 2024-10-15 ENCOUNTER — Encounter: Payer: Self-pay | Admitting: Nurse Practitioner

## 2024-10-15 ENCOUNTER — Ambulatory Visit: Admitting: Nurse Practitioner

## 2024-10-15 VITALS — BP 132/84 | HR 66 | Temp 97.0°F | Resp 16 | Ht 64.0 in | Wt 216.8 lb

## 2024-10-15 DIAGNOSIS — Z803 Family history of malignant neoplasm of breast: Secondary | ICD-10-CM | POA: Diagnosis not present

## 2024-10-15 DIAGNOSIS — Z1231 Encounter for screening mammogram for malignant neoplasm of breast: Secondary | ICD-10-CM

## 2024-10-15 DIAGNOSIS — E1169 Type 2 diabetes mellitus with other specified complication: Secondary | ICD-10-CM

## 2024-10-15 DIAGNOSIS — Z794 Long term (current) use of insulin: Secondary | ICD-10-CM | POA: Diagnosis not present

## 2024-10-15 NOTE — Progress Notes (Signed)
 [facility] [address] [place], [place] [address]  Internal MEDICINE  Office Visit Note  Patient Name: Tammy Beasley  [id number]  [id number]  Date of Service: 10/16/2024  Chief Complaint  Patient presents with   Diabetes   Hypertension   Follow-up    HPI Tammy Beasley presents for a follow-up visit for lab results, family history of cancers, routine screenings, diabetes  Erythrocytosis -- evaluated by hematology and nothing was found, she has been released by them.  Family history of cancer in multiple family -- pancreatic, breast, and others.  Due for mammogram  Diabetes-- doing well, taking her medication as prescribed, her glucose levels have been improving. She reports that she feels good as well.  She reports that she has put the [employer] app on her phone so she gets reminders when she has appointments coming up and she is doing very well making sure that she gets to all of her appointments.    Current Medication: Outpatient Encounter Medications as of 10/15/2024  Medication Sig   Accu-Chek Softclix Lancets lancets Use as instructed to check blood sugars twice a day  E11.65   albuterol  (VENTOLIN  HFA) 108 (90 Base) MCG/ACT inhaler Inhale 2 puffs into the lungs every 6 (six) hours as needed for wheezing or shortness of breath.   amLODipine  (NORVASC ) 5 MG tablet Take one tab po at bedtime for blood pressure   Blood Glucose Monitoring Suppl (ACCU-CHEK GUIDE) w/Device KIT Use as directed DXE11.65   cholecalciferol (VITAMIN D3) 25 MCG (1000 UNIT) tablet Take 1,000 Units by mouth daily.   Continuous Glucose Sensor (DEXCOM G7 SENSOR) MISC Apply 1 sensor every 10 days to check glucose via CGM for diabetes   dapagliflozin  propanediol (FARXIGA ) 10 MG TABS tablet Take 1 tablet (10 mg total) by mouth daily before breakfast.   ezetimibe  (ZETIA ) 10 MG tablet Take 10 mg by mouth daily.   glucose blood (ACCU-CHEK GUIDE TEST) test strip Use 1 test strip to check glucose once daily  and as needed. For diabetes E11.65   ondansetron  (ZOFRAN ) 4 MG tablet Take 1 tablet (4 mg total) by mouth every 8 (eight) hours as needed for nausea or vomiting.   rosuvastatin  (CRESTOR ) 10 MG tablet Take 1 tablet (10 mg total) by mouth daily.   tirzepatide  (MOUNJARO ) 7.5 MG/0.5ML Pen Inject 7.5 mg into the skin once a week.   TRESIBA  FLEXTOUCH 100 UNIT/ML FlexTouch Pen Inject 10-15 Units into the skin daily. If glucose is elevated   triamcinolone  ointment (KENALOG ) 0.5 % Apply 1 Application topically 2 (two) times daily.   No facility-administered encounter medications on file as of 10/15/2024.    Surgical History: Past Surgical History:  Procedure Laterality Date   BREAST LUMPECTOMY     celluilitus     COLONOSCOPY WITH PROPOFOL  N/A 01/13/2024   Procedure: COLONOSCOPY WITH PROPOFOL ;  Surgeon: Therisa Bi, MD;  Location: [facility] ENDOSCOPY;  Service: Gastroenterology;  Laterality: N/A;   POLYPECTOMY  01/13/2024   Procedure: POLYPECTOMY, INTESTINE;  Surgeon: Therisa Bi, MD;  Location: [facility] ENDOSCOPY;  Service: Gastroenterology;;    Medical History: Past Medical History:  Diagnosis Date   Diabetes mellitus without complication (HCC)    Hypertension     Family History: Family History  Problem Relation Age of Onset   Renal Disease Father    Kidney failure Father    Diabetes Father    Cancer Brother    Cancer Other     Social History   Socioeconomic History   Marital status:  Single    Spouse name: Not on file   Number of children: Not on file   Years of education: Not on file   Highest education level: Not on file  Occupational History   Not on file  Tobacco Use   Smoking status: Every Day    Types: Cigarettes   Smokeless tobacco: Never   Tobacco comments:    6 cigarettes daily  Vaping Use   Vaping status: Never Used  Substance and Sexual Activity   Alcohol use: Not Currently   Drug use: Never   Sexual activity: Not on file  Other Topics Concern   Not on file   Social History Narrative   Not on file   Social Drivers of Health   Tobacco Use: High Risk (10/15/2024)   Patient History    Smoking Tobacco Use: Every Day    Smokeless Tobacco Use: Never    Passive Exposure: Not on file  Financial Resource Strain: Not on file  Food Insecurity: Food Insecurity Present (09/02/2024)   Epic    Worried About Programme Researcher, Broadcasting/film/video in the Last Year: Sometimes true    [employer] of Food in the Last Year: Sometimes true  Transportation Needs: No Transportation Needs (09/02/2024)   Epic    Lack of Transportation (Medical): No    Lack of Transportation (Non-Medical): No  Physical Activity: Not on file  Stress: Not on file  Social Connections: Not on file  Intimate Partner Violence: Not on file  Depression (PHQ2-9): Low Risk (01/06/2024)   Depression (PHQ2-9)    PHQ-2 Score: 0  Alcohol Screen: Low Risk (10/13/2021)   Alcohol Screen    Last Alcohol Screening Score (AUDIT): 0  Housing: High Risk (09/02/2024)   Epic    Unable to Pay for Housing in the Last Year: Yes    Number of Times Moved in the Last Year: 0    Homeless in the Last Year: No  Utilities: Not At Risk (09/02/2024)   Epic    Threatened with loss of utilities: No  Health Literacy: Not on file      Review of Systems  Constitutional:  Negative for fatigue and fever.  HENT:  Negative for congestion, mouth sores and postnasal drip.   Respiratory:  Negative for cough.   Cardiovascular:  Negative for chest pain.  Gastrointestinal:  Negative for abdominal pain.  Genitourinary:  Negative for flank pain.  Musculoskeletal:  Negative for back pain.  Neurological:  Negative for headaches.  Psychiatric/Behavioral: Negative.  Negative for behavioral problems and suicidal ideas.     Vital Signs: BP 132/84   Pulse 66   Temp (!) 97 F (36.1 C)   Resp 16   Ht 5' 4 (1.626 m)   Wt 216 lb 12.8 oz (98.3 kg)   SpO2 99%   BMI 37.21 kg/m    Physical Exam Vitals reviewed.  Constitutional:       General: She is not in acute distress.    Appearance: Normal appearance. She is obese. She is not ill-appearing.  HENT:     Head: Normocephalic and atraumatic.  Eyes:     Pupils: Pupils are equal, round, and reactive to light.  Cardiovascular:     Rate and Rhythm: Normal rate and regular rhythm.  Pulmonary:     Effort: Pulmonary effort is normal. No respiratory distress.  Neurological:     Mental Status: She is alert and oriented to person, place, and time.  Psychiatric:  Mood and Affect: Mood normal.        Behavior: Behavior normal.        Assessment/Plan: 1. Type 2 diabetes mellitus with other specified complication, with long-term current use of insulin  (HCC) Continue medications as prescribed.   2. Family history of breast cancer (Primary) Routine mammogram ordered  - MM 3D SCREENING MAMMOGRAM BILATERAL BREAST; Future  3. Encounter for screening mammogram for malignant neoplasm of breast Routine mammogram ordered  - MM 3D SCREENING MAMMOGRAM BILATERAL BREAST; Future   General Counseling: Jakerra verbalizes understanding of the findings of todays visit and agrees with plan of treatment. I have discussed any further diagnostic evaluation that may be needed or ordered today. We also reviewed her medications today. she has been encouraged to call the office with any questions or concerns that should arise related to todays visit.    Orders Placed This Encounter  Procedures   MM 3D SCREENING MAMMOGRAM BILATERAL BREAST    No orders of the defined types were placed in this encounter.   Return for previously scheduled, CPE, Layth Cerezo PCP in april and otherwise as needed.   Total time spent:30 Minutes Time spent includes review of chart, medications, test results, and follow up plan with the patient.   [place] Controlled Substance Database was reviewed by me.  This patient was seen by Mardy Maxin, FNP-C in collaboration with Dr. Sigrid Bathe as a part of  collaborative care agreement.   Sherah Lund R. Maxin, MSN, FNP-C Internal medicine

## 2024-10-16 ENCOUNTER — Encounter: Payer: Self-pay | Admitting: Nurse Practitioner

## 2024-11-10 ENCOUNTER — Encounter

## 2025-01-06 ENCOUNTER — Encounter: Admitting: Nurse Practitioner
